# Patient Record
Sex: Male | Born: 1955 | Race: White | Hispanic: No | Marital: Married | Smoking: Never smoker
Health system: Southern US, Community
[De-identification: ages and names within clinical notes are randomized; demographics above are authoritative.]

## PROBLEM LIST (undated history)

## (undated) DIAGNOSIS — R200 Anesthesia of skin: Secondary | ICD-10-CM

## (undated) DIAGNOSIS — E785 Hyperlipidemia, unspecified: Secondary | ICD-10-CM

## (undated) DIAGNOSIS — I10 Essential (primary) hypertension: Secondary | ICD-10-CM

## (undated) DIAGNOSIS — R7303 Prediabetes: Secondary | ICD-10-CM

## (undated) DIAGNOSIS — U071 COVID-19: Secondary | ICD-10-CM

## (undated) DIAGNOSIS — C801 Malignant (primary) neoplasm, unspecified: Secondary | ICD-10-CM

## (undated) HISTORY — DX: Essential (primary) hypertension: I10

## (undated) HISTORY — DX: Hyperlipidemia, unspecified: E78.5

## (undated) HISTORY — DX: Anesthesia of skin: R20.0

## (undated) HISTORY — PX: TONSILLECTOMY: SUR1361

## (undated) HISTORY — DX: Prediabetes: R73.03

---

## 2007-02-10 ENCOUNTER — Encounter: Admission: RE | Admit: 2007-02-10 | Discharge: 2007-02-10 | Payer: Self-pay | Admitting: Family Medicine

## 2010-02-04 ENCOUNTER — Encounter: Admission: RE | Admit: 2010-02-04 | Discharge: 2010-02-04 | Payer: Self-pay | Admitting: Gastroenterology

## 2010-03-11 ENCOUNTER — Ambulatory Visit (HOSPITAL_COMMUNITY): Admission: RE | Admit: 2010-03-11 | Discharge: 2010-03-11 | Payer: Self-pay | Admitting: Gastroenterology

## 2013-08-06 ENCOUNTER — Other Ambulatory Visit: Payer: Self-pay | Admitting: Family Medicine

## 2013-08-06 DIAGNOSIS — I6529 Occlusion and stenosis of unspecified carotid artery: Secondary | ICD-10-CM

## 2013-08-10 ENCOUNTER — Ambulatory Visit
Admission: RE | Admit: 2013-08-10 | Discharge: 2013-08-10 | Disposition: A | Discharge status: Home / Self Care | Payer: BC Managed Care – PPO | Source: Ambulatory Visit | Attending: Family Medicine | Admitting: Family Medicine

## 2013-08-10 DIAGNOSIS — I6529 Occlusion and stenosis of unspecified carotid artery: Secondary | ICD-10-CM

## 2013-08-24 ENCOUNTER — Ambulatory Visit (HOSPITAL_BASED_OUTPATIENT_CLINIC_OR_DEPARTMENT_OTHER): Payer: BC Managed Care – PPO | Attending: Otolaryngology

## 2013-08-24 VITALS — Ht 70.0 in | Wt 210.0 lb

## 2013-08-24 DIAGNOSIS — R0683 Snoring: Secondary | ICD-10-CM

## 2013-08-24 DIAGNOSIS — G4733 Obstructive sleep apnea (adult) (pediatric): Secondary | ICD-10-CM | POA: Insufficient documentation

## 2013-09-08 DIAGNOSIS — R0989 Other specified symptoms and signs involving the circulatory and respiratory systems: Secondary | ICD-10-CM

## 2013-09-08 DIAGNOSIS — R0609 Other forms of dyspnea: Secondary | ICD-10-CM

## 2013-09-08 NOTE — Sleep Study (Signed)
   NAME: Ronald Arnold DATE OF BIRTH:  [birth year] MEDICAL RECORD NUMBER [id number]  LOCATION: [facility]  PHYSICIAN: Marthella Osorno D  DATE OF STUDY: 08/24/2013  SLEEP STUDY TYPE: Nocturnal Polysomnogram               REFERRING PHYSICIAN: Thornell Sartorius, MD  INDICATION FOR STUDY: Hypersomnia with sleep apnea  EPWORTH SLEEPINESS SCORE:   8/24 HEIGHT: 5\' 10"  (177.8 cm)  WEIGHT: 210 lb (95.255 kg)    Body mass index is 30.13 kg/(m^2).  NECK SIZE: 16.5 in.  MEDICATIONS: Charted for review  SLEEP ARCHITECTURE: Total sleep time 279.5 minutes with sleep efficiency 72.8%. Stage I was 35.6%, stage II 62.1%, stage III absent, REM 2.3% of total sleep time. Sleep latency 22 minutes, REM latency 292.5 minutes, awake after sleep onset 83.5 minutes, arousal index 86.5. Bedtime medication: None. Sleep was markedly fragmented by frequent brief awakenings and sleep stage changes corresponding to respiratory events.  RESPIRATORY DATA: Apnea hypopnea index (AHI) 88.2 per hour. 411 total events scored including 306 obstructive apneas, 25 central apneas, 27 mixed apneas, 53 hypopneas. Events were not positional. REM AHI 83.1 per hour. This study was ordered as a diagnostic polysomnogram and CPAP titration was not done.  OXYGEN DATA: Loud snoring with oxygen desaturation to a nadir of 79% on room air with a mean oxygen saturation through the study of 92.2% on room air.  CARDIAC DATA: Normal cardiac rhythm  MOVEMENT/PARASOMNIA: No significant movement disturbance, no bathroom trips  IMPRESSION/ RECOMMENDATION:   1) Severe obstructive sleep apnea/hypopneas syndrome, AHI 88.2 per hour with non-positional events. Loud snoring with oxygen desaturation to a nadir of 79% and mean oxygen saturation of 92.2% on room air. 2) This study was ordered as a diagnostic polysomnogram protocol without CPAP. The patient can be scheduled for a dedicated to CPAP titration study through the [facility] it appropriate.   Signed Baird Lyons M.D.  Deneise Lever Diplomate, American Board of Sleep Medicine  ELECTRONICALLY SIGNED ON:  09/08/2013, 10:02 AM [place] PH: [phone]   FX: [phone] [place]

## 2014-05-23 ENCOUNTER — Other Ambulatory Visit: Payer: Self-pay | Admitting: Urology

## 2014-06-12 NOTE — Patient Instructions (Addendum)
Debra Calabretta Tino  06/12/2014   Your procedure is scheduled on:  06/20/2014    Come thru the Emergency room Entrance.    Follow the Signs to [facility] at  [place]      am  Call this number if you have problems the morning of surgery: [phone]   Remember:   Do not eat food or drink liquids after midnight.   Take these medicines the morning of surgery with A SIP OF WATER: Flonase nasal spray , Allegra    Do not wear jewelry,   Do not wear lotions, powders, or perfumes.  deodorant.  . Men may shave face and neck.  Do not bring valuables to the hospital.  Contacts, dentures or bridgework may not be worn into surgery.  Leave suitcase in the car. After surgery it may be brought to your room.  For patients admitted to the hospital, checkout time is 11:00 AM the day of  discharge.         Please read over the following fact sheets that you were given: , coughing and deep breathing exercises, leg exercises            [facility] - Preparing for Surgery Before surgery, you can play an important role.  Because skin is not sterile, your skin needs to be as free of germs as possible.  You can reduce the number of germs on your skin by washing with CHG (chlorahexidine gluconate) soap before surgery.  CHG is an antiseptic cleaner which kills germs and bonds with the skin to continue killing germs even after washing. Please DO NOT use if you have an allergy to CHG or antibacterial soaps.  If your skin becomes reddened/irritated stop using the CHG and inform your nurse when you arrive at Short Stay. Do not shave (including legs and underarms) for at least 48 hours prior to the first CHG shower.  You may shave your face/neck. Please follow these instructions carefully:  1.  Shower with CHG Soap the night before surgery and the  morning of Surgery.  2.  If you choose to wash your hair, wash your hair first as usual with your  normal  shampoo.  3.  After you shampoo, rinse your hair and body  thoroughly to remove the  shampoo.                           4.  Use CHG as you would any other liquid soap.  You can apply chg directly  to the skin and wash                       Gently with a scrungie or clean washcloth.  5.  Apply the CHG Soap to your body ONLY FROM THE NECK DOWN.   Do not use on face/ open                           Wound or open sores. Avoid contact with eyes, ears mouth and genitals (private parts).                       Wash face,  Genitals (private parts) with your normal soap.             6.  Wash thoroughly, paying special attention to the area where your surgery  will be performed.  7.  Thoroughly rinse your body with warm water from the neck down.  8.  DO NOT shower/wash with your normal soap after using and rinsing off  the CHG Soap.                9.  Pat yourself dry with a clean towel.            10.  Wear clean pajamas.            11.  Place clean sheets on your bed the night of your first shower and do not  sleep with pets. Day of Surgery : Do not apply any lotions/deodorants the morning of surgery.  Please wear clean clothes to the hospital/surgery center.  FAILURE TO FOLLOW THESE INSTRUCTIONS MAY RESULT IN THE CANCELLATION OF YOUR SURGERY PATIENT SIGNATURE_________________________________  NURSE SIGNATURE__________________________________  ________________________________________________________________________  WHAT IS A BLOOD TRANSFUSION? Blood Transfusion Information  A transfusion is the replacement of blood or some of its parts. Blood is made up of multiple cells which provide different functions.  Red blood cells carry oxygen and are used for blood loss replacement.  White blood cells fight against infection.  Platelets control bleeding.  Plasma helps clot blood.  Other blood products are available for specialized needs, such as hemophilia or other clotting disorders. BEFORE THE TRANSFUSION  Who gives blood for transfusions?   Healthy  volunteers who are fully evaluated to make sure their blood is safe. This is blood bank blood. Transfusion therapy is the safest it has ever been in the practice of medicine. Before blood is taken from a donor, a complete history is taken to make sure that person has no history of diseases nor engages in risky social behavior (examples are intravenous drug use or sexual activity with multiple partners). The donor's travel history is screened to minimize risk of transmitting infections, such as malaria. The donated blood is tested for signs of infectious diseases, such as HIV and hepatitis. The blood is then tested to be sure it is compatible with you in order to minimize the chance of a transfusion reaction. If you or a relative donates blood, this is often done in anticipation of surgery and is not appropriate for emergency situations. It takes many days to process the donated blood. RISKS AND COMPLICATIONS Although transfusion therapy is very safe and saves many lives, the main dangers of transfusion include:  1. Getting an infectious disease. 2. Developing a transfusion reaction. This is an allergic reaction to something in the blood you were given. Every precaution is taken to prevent this. The decision to have a blood transfusion has been considered carefully by your caregiver before blood is given. Blood is not given unless the benefits outweigh the risks. AFTER THE TRANSFUSION  Right after receiving a blood transfusion, you will usually feel much better and more energetic. This is especially true if your red blood cells have gotten low (anemic). The transfusion raises the level of the red blood cells which carry oxygen, and this usually causes an energy increase.  The nurse administering the transfusion will monitor you carefully for complications. HOME CARE INSTRUCTIONS  No special instructions are needed after a transfusion. You may find your energy is better. Speak with your caregiver about  any limitations on activity for underlying diseases you may have. SEEK MEDICAL CARE IF:   Your condition is not improving after your transfusion.  You develop redness or irritation at the intravenous (IV) site. SEEK IMMEDIATE MEDICAL CARE IF:  Any of  the following symptoms occur over the next 12 hours:  Shaking chills.  You have a temperature by mouth above 102 F (38.9 C), not controlled by medicine.  Chest, back, or muscle pain.  People around you feel you are not acting correctly or are confused.  Shortness of breath or difficulty breathing.  Dizziness and fainting.  You get a rash or develop hives.  You have a decrease in urine output.  Your urine turns a dark color or changes to pink, red, or brown. Any of the following symptoms occur over the next 10 days:  You have a temperature by mouth above 102 F (38.9 C), not controlled by medicine.  Shortness of breath.  Weakness after normal activity.  The white part of the eye turns yellow (jaundice).  You have a decrease in the amount of urine or are urinating less often.  Your urine turns a dark color or changes to pink, red, or brown. Document Released: 07/02/2000 Document Revised: 09/27/2011 Document Reviewed: 02/19/2008 ExitCare Patient Information 2014 [place].  _______________________________________________________________________  Incentive Spirometer  An incentive spirometer is a tool that can help keep your lungs clear and active. This tool measures how well you are filling your lungs with each breath. Taking long deep breaths may help reverse or decrease the chance of developing breathing (pulmonary) problems (especially infection) following:  A long period of time when you are unable to move or be active. BEFORE THE PROCEDURE   If the spirometer includes an indicator to show your best effort, your nurse or respiratory therapist will set it to a desired goal.  If possible, sit up straight or  lean slightly forward. Try not to slouch.  Hold the incentive spirometer in an upright position. INSTRUCTIONS FOR USE  3. Sit on the edge of your bed if possible, or sit up as far as you can in bed or on a chair. 4. Hold the incentive spirometer in an upright position. 5. Breathe out normally. 6. Place the mouthpiece in your mouth and seal your lips tightly around it. 7. Breathe in slowly and as deeply as possible, raising the piston or the ball toward the top of the column. 8. Hold your breath for 3-5 seconds or for as long as possible. Allow the piston or ball to fall to the bottom of the column. 9. Remove the mouthpiece from your mouth and breathe out normally. 10. Rest for a few seconds and repeat Steps 1 through 7 at least 10 times every 1-2 hours when you are awake. Take your time and take a few normal breaths between deep breaths. 11. The spirometer may include an indicator to show your best effort. Use the indicator as a goal to work toward during each repetition. 12. After each set of 10 deep breaths, practice coughing to be sure your lungs are clear. If you have an incision (the cut made at the time of surgery), support your incision when coughing by placing a pillow or rolled up towels firmly against it. Once you are able to get out of bed, walk around indoors and cough well. You may stop using the incentive spirometer when instructed by your caregiver.  RISKS AND COMPLICATIONS  Take your time so you do not get dizzy or light-headed.  If you are in pain, you may need to take or ask for pain medication before doing incentive spirometry. It is harder to take a deep breath if you are having pain. AFTER USE  Rest and breathe slowly and  easily.  It can be helpful to keep track of a log of your progress. Your caregiver can provide you with a simple table to help with this. If you are using the spirometer at home, follow these instructions: [place] IF:   You are having  difficultly using the spirometer.  You have trouble using the spirometer as often as instructed.  Your pain medication is not giving enough relief while using the spirometer.  You develop fever of 100.5 F (38.1 C) or higher. SEEK IMMEDIATE MEDICAL CARE IF:   You cough up bloody sputum that had not been present before.  You develop fever of 102 F (38.9 C) or greater.  You develop worsening pain at or near the incision site. MAKE SURE YOU:   Understand these instructions.  Will watch your condition.  Will get help right away if you are not doing well or get worse. Document Released: 11/15/2006 Document Revised: 09/27/2011 Document Reviewed: 01/16/2007 [facility] Patient Information 2014 [place], [place].   ________________________________________________________________________

## 2014-06-17 ENCOUNTER — Encounter (HOSPITAL_COMMUNITY): Payer: Self-pay

## 2014-06-17 ENCOUNTER — Ambulatory Visit (HOSPITAL_COMMUNITY)
Admission: RE | Admit: 2014-06-17 | Discharge: 2014-06-17 | Disposition: A | Discharge status: Home / Self Care | Payer: BC Managed Care – PPO | Source: Ambulatory Visit | Attending: Urology | Admitting: Urology

## 2014-06-17 ENCOUNTER — Encounter (HOSPITAL_COMMUNITY)
Admission: RE | Admit: 2014-06-17 | Discharge: 2014-06-17 | Disposition: A | Discharge status: Home / Self Care | Payer: BC Managed Care – PPO | Source: Ambulatory Visit | Attending: Urology | Admitting: Urology

## 2014-06-17 ENCOUNTER — Encounter (HOSPITAL_COMMUNITY): Payer: Self-pay | Admitting: *Deleted

## 2014-06-17 DIAGNOSIS — Z01818 Encounter for other preprocedural examination: Secondary | ICD-10-CM | POA: Insufficient documentation

## 2014-06-17 LAB — CBC
HCT: 47.2 % (ref 39.0–52.0)
Hemoglobin: 16 g/dL (ref 13.0–17.0)
MCH: 31.2 pg (ref 26.0–34.0)
MCHC: 33.9 g/dL (ref 30.0–36.0)
MCV: 92 fL (ref 78.0–100.0)
Platelets: 248 10*3/uL (ref 150–400)
RBC: 5.13 MIL/uL (ref 4.22–5.81)
RDW: 12.8 % (ref 11.5–15.5)
WBC: 5.1 10*3/uL (ref 4.0–10.5)

## 2014-06-17 LAB — ABO/RH: ABO/RH(D): O POS

## 2014-06-17 LAB — BASIC METABOLIC PANEL
Anion gap: 10 (ref 5–15)
BUN: 16 mg/dL (ref 6–23)
CO2: 29 mEq/L (ref 19–32)
Calcium: 10 mg/dL (ref 8.4–10.5)
Chloride: 95 mEq/L — ABNORMAL LOW (ref 96–112)
Creatinine, Ser: 0.87 mg/dL (ref 0.50–1.35)
GFR calc Af Amer: >90 mL/min (ref >90)
GFR calc non Af Amer: >90 mL/min (ref >90)
Glucose, Bld: 110 mg/dL — ABNORMAL HIGH (ref 70–99)
Potassium: 5 mEq/L (ref 3.7–5.3)
Sodium: 134 mEq/L — ABNORMAL LOW (ref 137–147)

## 2014-06-19 NOTE — Anesthesia Preprocedure Evaluation (Addendum)
Anesthesia Evaluation  Patient identified by MRN, date of birth, ID band Patient awake    Reviewed: Allergy & Precautions, H&P , NPO status , Patient's Chart, lab work & pertinent test results  History of Anesthesia Complications Negative for: history of anesthetic complications  Airway Mallampati: II  TM Distance: >3 FB Neck ROM: Full    Dental no notable dental hx. (+) Dental Advisory Given   Pulmonary neg pulmonary ROS,  breath sounds clear to auscultation  Pulmonary exam normal       Cardiovascular Exercise Tolerance: Good negative cardio ROS  Rhythm:Regular Rate:Normal     Neuro/Psych negative neurological ROS  negative psych ROS   GI/Hepatic negative GI ROS, Neg liver ROS,   Endo/Other  negative endocrine ROS  Renal/GU negative Renal ROS  negative genitourinary   Musculoskeletal negative musculoskeletal ROS (+)   Abdominal   Peds negative pediatric ROS (+)  Hematology negative hematology ROS (+)   Anesthesia Other Findings Prostate ca  Reproductive/Obstetrics negative OB ROS                             Anesthesia Physical Anesthesia Plan  ASA: II  Anesthesia Plan: General   Post-op Pain Management:    Induction: Intravenous  Airway Management Planned: Oral ETT  Additional Equipment:   Intra-op Plan:   Post-operative Plan: Extubation in OR  Informed Consent: I have reviewed the patients History and Physical, chart, labs and discussed the procedure including the risks, benefits and alternatives for the proposed anesthesia with the patient or authorized representative who has indicated his/her understanding and acceptance.   Dental advisory given  Plan Discussed with: CRNA  Anesthesia Plan Comments:         Anesthesia Quick Evaluation

## 2014-06-19 NOTE — H&P (Signed)
Chief Complaint Prostate Cancer   Reason For Visit Reason for consult: To discuss treatment options for prostate cancer and specifically to consider surgical therapy with a robotic prostatectomy.  Physician requesting consult: Dr. Eda Keys  PCP: Dr. 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 He was noted to have an elevated PSA of 4.37 prompting a prostate needle biopsy on 04/15/14. 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 He has a paternal family history of prostate cancer. His father was apparently diagnosed in his late 63s but did not appear to died of complications directly related to prostate cancer. He has been thoroughly counseled by Dr. Junious Silk and is well informed about his treatment options. He has longevity in his family with his father having died at age 8 and his mom still living at age 34.    TNM stage: cT1c Nx Mx  PSA: 4.37  Gleason score: 4+3=7  Biopsy (04/15/14): 2/12 cores -- R mid (50%, 4+3=7), R base (10%, 4+3=7)  Prostate volume: 41 cc    Nomogram  OC disease: 47%  EPE: 51%  SVI: 5%  LNI: 5%  PFS (surgery): 77% at 5 years, 65% at 10 years    Urinary   function: IPSS is 4.  1  Erectile function:  SHIM score is 25.1     1 Amended By: Raynelle Bring; May 22 2014 9:16 AM EST  Past Medical History Problems  1. History of Fatty liver (K76.0) 2. History of esophageal reflux (Z87.19) 3. History of hypercholesterolemia (Z86.39) 4. History of Serum Enzyme Levels - ALT (SGPT) Elevated 5. History of Transient Global Amnesia  Surgical History Problems  1. History of Tonsillectomy  Current Meds 1. Allegra CAPS;  Therapy: (Recorded:25Mar2015) to Recorded 2. Aspirin 81 MG Oral Tablet;  Therapy: (Recorded:25Feb2014) to Recorded 3. Cholestrol Pill;  Therapy: (Recorded:25Feb2014) to Recorded 4. Fish Oil CAPS;  Therapy: (Recorded:25Mar2015) to Recorded 5.  Flonase SUSP;  Therapy: (Recorded:25Feb2014) to Recorded 6. Levofloxacin 500 MG Oral Tablet; Take 1 tablet po the day before procedure, 1 tab  day of  the procedure and 1 tab the day after procedure;  Therapy: [id number] to (Last Rx:16Sep2015)  Requested for: 16Sep2015 Ordered 7. Multi-Vitamin TABS;  Therapy: (Recorded:25Feb2014) to Recorded 8. Niacin TABS;  Therapy: (Recorded:25Mar2015) to Recorded 9. Red Yeast Rice CAPS;  Therapy: (Recorded:25Feb2014) to Recorded  Allergies Medication  1. Sulfa Drugs  Family History Problems  1. Family history of Death In The Family Father   age 37 of   ? prostate cancer 2. Family history of Diabetes Mellitus : Mother 3. Family history of Hypertension : Mother 4. Family history of Prostate Cancer : Father  Social History Problems    Alcohol Use   1 a week   Marital History - Currently Married   Never A Smoker   Occupation:   Mudlogger at [employer]   Denied: History of Tobacco Use  Review of Systems Genitourinary, constitutional, skin, eye, otolaryngeal, hematologic/lymphatic, cardiovascular, pulmonary, endocrine, musculoskeletal, gastrointestinal, neurological and psychiatric system(s) were reviewed and pertinent findings if present are noted.    Vitals Vital Signs [Data Includes: Last 1 Day]  Recorded: [id number] 11:23AM  Blood Pressure: 145 / 93 Temperature: 98 F Heart Rate: 86  Physical Exam Constitutional: Well nourished and well developed . No acute distress.  ENT:. The ears and nose are normal in appearance.  Neck: The appearance of the  neck is normal and no neck mass is present.  Pulmonary: No respiratory distress, normal respiratory rhythm and effort and clear bilateral breath sounds.  Cardiovascular: Heart rate and rhythm are normal . No peripheral edema.  Abdomen: The abdomen is soft and nontender. No masses are palpated. No CVA tenderness. No hernias are palpable. No  hepatosplenomegaly noted.  Rectal: Rectal exam demonstrates normal sphincter tone, no tenderness and no masses. Prostate size is estimated to be 45 g. The prostate has no nodularity and is not tender. The left seminal vesicle is nonpalpable. The right seminal vesicle is nonpalpable. The perineum is normal on inspection.  Lymphatics: The femoral and inguinal nodes are not enlarged or tender.  Skin: Normal skin turgor, no visible rash and no visible skin lesions.  Neuro/Psych:. Mood and affect are appropriate.    Results/Data Selected Results  UA With REFLEX [id number] 11:10AM Raynelle Bring  SPECIMEN TYPE: CLEAN CATCH   Test Name Result Flag Reference  COLOR STRAW  YELLOW  APPEARANCE CLEAR  CLEAR  SPECIFIC GRAVITY <1.005 L 1.005-1.030  pH 7.5  5.0-8.0  GLUCOSE NEG mg/dL  NEG  BILIRUBIN NEG  NEG  KETONE NEG mg/dL  NEG  BLOOD NEG  NEG  PROTEIN NEG mg/dL  NEG  UROBILINOGEN 0.2 mg/dL  0.0-1.0  NITRITE NEG  NEG  LEUKOCYTE ESTERASE NEG  NEG    I have reviewed his medical records, PSA results, and pathology report. Findings are as dictated above.  Assessment Assessed  1. Prostate cancer (C61)  Plan Prostate cancer  1. Follow-up PRN Office  Follow-up  Status: Hold For - Appointment  Requested for:  [id number]  Discussion/Summary 1. Prostate cancer: I had a long and detailed discussion with Dr. Dahlia Byes and his wife today. He is very well informed about his treatment options and is leaning toward proceeding with surgical treatment.   The patient was counseled about the natural history of prostate cancer and the standard treatment options that are available for prostate cancer. It was explained to him how his age and life expectancy, clinical stage, Gleason score, and PSA affect his prognosis, the decision to proceed with additional staging studies, as well as how that information influences recommended treatment strategies. We discussed the roles for active surveillance, radiation therapy,  surgical therapy, androgen deprivation, as well as ablative therapy options for the treatment of prostate cancer as appropriate to his individual cancer situation. We discussed the risks and benefits of these options with regard to their impact on cancer control and also in terms of potential adverse events, complications, and impact on quiality of life particularly related to urinary, bowel, and sexual function. The patient was encouraged to ask questions throughout the discussion today and all questions were answered to his stated satisfaction. In addition, the patient was provided with and/or directed to appropriate resources and literature for further education about prostate cancer and treatment options.   We discussed surgical therapy for prostate cancer including the different available surgical approaches. We discussed, in detail, the risks and expectations of surgery with regard to cancer control, urinary control, and erectile function as well as the expected postoperative recovery process. Additional risks of surgery including but not limited to bleeding, infection, hernia formation, nerve damage, lymphocele formation, bowel/rectal injury potentially necessitating colostomy, damage to the urinary tract resulting in urine leakage, urethral stricture, and the cardiopulmonary risks such as myocardial infarction, stroke, death, venothromboembolism, etc. were explained. The risk of open surgical conversion for robotic/laparoscopic prostatectomy was also discussed.  He has asked numerous good questions today. He would like to discuss things further with his wife but likely wishes to proceed with surgical treatment. My plan would be to perform a bilateral nerve sparing robotic-assisted laparoscopic radical prostatectomy and pelvic lymphadenectomy. 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  A total of 80 minutes were spent in the overall care of the patient today with 75 minutes in direct face to face consultation.     Cc: Dr. Eda Keys  Dr. Melinda Crutch    Signatures Electronically signed by : Raynelle Bring, M.D.; May 22 2014  9:16AM EST

## 2014-06-20 ENCOUNTER — Inpatient Hospital Stay (HOSPITAL_COMMUNITY): Payer: BC Managed Care – PPO | Admitting: Anesthesiology

## 2014-06-20 ENCOUNTER — Inpatient Hospital Stay (HOSPITAL_COMMUNITY)
Admission: RE | Admit: 2014-06-20 | Discharge: 2014-06-21 | DRG: 708 | Disposition: A | Discharge status: Home / Self Care | Payer: BC Managed Care – PPO | Source: Ambulatory Visit | Attending: Urology | Admitting: Urology

## 2014-06-20 ENCOUNTER — Encounter (HOSPITAL_COMMUNITY)
Admission: RE | Disposition: A | Discharge status: Home / Self Care | Payer: Self-pay | Source: Ambulatory Visit | Attending: Urology

## 2014-06-20 ENCOUNTER — Encounter (HOSPITAL_COMMUNITY): Payer: Self-pay | Admitting: *Deleted

## 2014-06-20 DIAGNOSIS — Z833 Family history of diabetes mellitus: Secondary | ICD-10-CM | POA: Diagnosis not present

## 2014-06-20 DIAGNOSIS — Z8042 Family history of malignant neoplasm of prostate: Secondary | ICD-10-CM

## 2014-06-20 DIAGNOSIS — C61 Malignant neoplasm of prostate: Secondary | ICD-10-CM | POA: Diagnosis present

## 2014-06-20 DIAGNOSIS — E780 Pure hypercholesterolemia: Secondary | ICD-10-CM | POA: Diagnosis present

## 2014-06-20 DIAGNOSIS — Z79899 Other long term (current) drug therapy: Secondary | ICD-10-CM | POA: Diagnosis not present

## 2014-06-20 DIAGNOSIS — Z7982 Long term (current) use of aspirin: Secondary | ICD-10-CM

## 2014-06-20 DIAGNOSIS — K219 Gastro-esophageal reflux disease without esophagitis: Secondary | ICD-10-CM | POA: Diagnosis present

## 2014-06-20 HISTORY — PX: LYMPHADENECTOMY: SHX5960

## 2014-06-20 HISTORY — PX: ROBOT ASSISTED LAPAROSCOPIC RADICAL PROSTATECTOMY: SHX5141

## 2014-06-20 HISTORY — DX: Malignant (primary) neoplasm, unspecified: C80.1

## 2014-06-20 LAB — HEMOGLOBIN AND HEMATOCRIT, BLOOD
HCT: 45.9 % (ref 39.0–52.0)
Hemoglobin: 15.7 g/dL (ref 13.0–17.0)

## 2014-06-20 LAB — TYPE AND SCREEN
ABO/RH(D): O POS
Antibody Screen: NEGATIVE

## 2014-06-20 SURGERY — ROBOTIC ASSISTED LAPAROSCOPIC RADICAL PROSTATECTOMY LEVEL 2
Anesthesia: General

## 2014-06-20 MED ORDER — LACTATED RINGERS IV SOLN
INTRAVENOUS | Status: DC | PRN
  Administered 2014-06-20 (×3): via INTRAVENOUS

## 2014-06-20 MED ORDER — MIDAZOLAM HCL 2 MG/2ML IJ SOLN
INTRAMUSCULAR | Status: AC
  Filled 2014-06-20: qty 2

## 2014-06-20 MED ORDER — LACTATED RINGERS IV SOLN: INTRAVENOUS | Status: DC

## 2014-06-20 MED ORDER — FENTANYL CITRATE 0.05 MG/ML IJ SOLN
INTRAMUSCULAR | Status: DC | PRN
  Administered 2014-06-20 (×2): 100 ug via INTRAVENOUS
  Administered 2014-06-20: 50 ug via INTRAVENOUS

## 2014-06-20 MED ORDER — BUPIVACAINE-EPINEPHRINE (PF) 0.25% -1:200000 IJ SOLN
INTRAMUSCULAR | Status: AC
  Filled 2014-06-20: qty 30

## 2014-06-20 MED ORDER — ROCURONIUM BROMIDE 100 MG/10ML IV SOLN
INTRAVENOUS | Status: DC | PRN
  Administered 2014-06-20: 10 mg via INTRAVENOUS
  Administered 2014-06-20: 20 mg via INTRAVENOUS
  Administered 2014-06-20: 10 mg via INTRAVENOUS
  Administered 2014-06-20: 35 mg via INTRAVENOUS
  Administered 2014-06-20: 5 mg via INTRAVENOUS

## 2014-06-20 MED ORDER — NEOSTIGMINE METHYLSULFATE 10 MG/10ML IV SOLN
INTRAVENOUS | Status: AC
  Filled 2014-06-20: qty 1

## 2014-06-20 MED ORDER — BUPIVACAINE-EPINEPHRINE 0.25% -1:200000 IJ SOLN
INTRAMUSCULAR | Status: DC | PRN
  Administered 2014-06-20: 30 mL

## 2014-06-20 MED ORDER — STERILE WATER FOR IRRIGATION IR SOLN
Status: DC | PRN
  Administered 2014-06-20: 1500 mL

## 2014-06-20 MED ORDER — HYDROMORPHONE HCL 2 MG/ML IJ SOLN
INTRAMUSCULAR | Status: AC
  Filled 2014-06-20: qty 1

## 2014-06-20 MED ORDER — HYDROMORPHONE HCL 1 MG/ML IJ SOLN
INTRAMUSCULAR | Status: DC | PRN
  Administered 2014-06-20: 0.5 mg via INTRAVENOUS
  Administered 2014-06-20: 1 mg via INTRAVENOUS
  Administered 2014-06-20: 0.5 mg via INTRAVENOUS

## 2014-06-20 MED ORDER — FENTANYL CITRATE 0.05 MG/ML IJ SOLN
INTRAMUSCULAR | Status: AC
  Filled 2014-06-20: qty 5

## 2014-06-20 MED ORDER — LACTATED RINGERS IV SOLN
INTRAVENOUS | Status: DC | PRN
  Administered 2014-06-20: 200 mL

## 2014-06-20 MED ORDER — KETOROLAC TROMETHAMINE 15 MG/ML IJ SOLN
15.0000 mg | Freq: Four times a day (QID) | INTRAMUSCULAR | Status: DC
  Administered 2014-06-20 – 2014-06-21 (×4): 15 mg via INTRAVENOUS
  Filled 2014-06-20 (×6): qty 1

## 2014-06-20 MED ORDER — ROCURONIUM BROMIDE 100 MG/10ML IV SOLN
INTRAVENOUS | Status: AC
  Filled 2014-06-20: qty 1

## 2014-06-20 MED ORDER — DEXAMETHASONE SODIUM PHOSPHATE 10 MG/ML IJ SOLN
INTRAMUSCULAR | Status: DC | PRN
  Administered 2014-06-20: 10 mg via INTRAVENOUS

## 2014-06-20 MED ORDER — PROPOFOL 10 MG/ML IV BOLUS
INTRAVENOUS | Status: DC | PRN
  Administered 2014-06-20: 180 mg via INTRAVENOUS

## 2014-06-20 MED ORDER — ONDANSETRON HCL 4 MG/2ML IJ SOLN
INTRAMUSCULAR | Status: DC | PRN
  Administered 2014-06-20: 4 mg via INTRAVENOUS

## 2014-06-20 MED ORDER — GLYCOPYRROLATE 0.2 MG/ML IJ SOLN
INTRAMUSCULAR | Status: DC | PRN
  Administered 2014-06-20: 0.6 mg via INTRAVENOUS

## 2014-06-20 MED ORDER — ACETAMINOPHEN 325 MG PO TABS
650.0000 mg | ORAL_TABLET | ORAL | Status: DC | PRN
  Administered 2014-06-20: 650 mg via ORAL
  Filled 2014-06-20: qty 2

## 2014-06-20 MED ORDER — NEOSTIGMINE METHYLSULFATE 10 MG/10ML IV SOLN
INTRAVENOUS | Status: DC | PRN
  Administered 2014-06-20: 4 mg via INTRAVENOUS

## 2014-06-20 MED ORDER — CEFAZOLIN SODIUM 1-5 GM-% IV SOLN
1.0000 g | Freq: Three times a day (TID) | INTRAVENOUS | Status: AC
  Administered 2014-06-20 (×2): 1 g via INTRAVENOUS
  Filled 2014-06-20 (×3): qty 50

## 2014-06-20 MED ORDER — ONDANSETRON HCL 4 MG/2ML IJ SOLN: 4.0000 mg | Freq: Once | INTRAMUSCULAR | Status: DC | PRN

## 2014-06-20 MED ORDER — DIPHENHYDRAMINE HCL 50 MG/ML IJ SOLN: 12.5000 mg | Freq: Four times a day (QID) | INTRAMUSCULAR | Status: DC | PRN

## 2014-06-20 MED ORDER — SODIUM CHLORIDE 0.9 % IR SOLN
Status: DC | PRN
  Administered 2014-06-20: 300 mL via INTRAVESICAL

## 2014-06-20 MED ORDER — DIPHENHYDRAMINE HCL 12.5 MG/5ML PO ELIX: 12.5000 mg | ORAL_SOLUTION | Freq: Four times a day (QID) | ORAL | Status: DC | PRN

## 2014-06-20 MED ORDER — CEFAZOLIN SODIUM-DEXTROSE 2-3 GM-% IV SOLR
2.0000 g | INTRAVENOUS | Status: AC
  Administered 2014-06-20: 2 g via INTRAVENOUS

## 2014-06-20 MED ORDER — DOCUSATE SODIUM 100 MG PO CAPS
100.0000 mg | ORAL_CAPSULE | Freq: Two times a day (BID) | ORAL | Status: DC
  Administered 2014-06-20 – 2014-06-21 (×3): 100 mg via ORAL
  Filled 2014-06-20 (×4): qty 1

## 2014-06-20 MED ORDER — MORPHINE SULFATE 2 MG/ML IJ SOLN: 2.0000 mg | INTRAMUSCULAR | Status: DC | PRN

## 2014-06-20 MED ORDER — PROPOFOL 10 MG/ML IV BOLUS
INTRAVENOUS | Status: AC
  Filled 2014-06-20: qty 20

## 2014-06-20 MED ORDER — CIPROFLOXACIN HCL 500 MG PO TABS: 500.0000 mg | ORAL_TABLET | Freq: Two times a day (BID) | ORAL | Status: DC

## 2014-06-20 MED ORDER — SODIUM CHLORIDE 0.9 % IV BOLUS (SEPSIS)
1000.0000 mL | Freq: Once | INTRAVENOUS | Status: AC
  Administered 2014-06-20: 1000 mL via INTRAVENOUS

## 2014-06-20 MED ORDER — HEPARIN SODIUM (PORCINE) 1000 UNIT/ML IJ SOLN
INTRAMUSCULAR | Status: AC
  Filled 2014-06-20: qty 1

## 2014-06-20 MED ORDER — LIDOCAINE HCL (CARDIAC) 20 MG/ML IV SOLN
INTRAVENOUS | Status: AC
  Filled 2014-06-20: qty 5

## 2014-06-20 MED ORDER — CEFAZOLIN SODIUM-DEXTROSE 2-3 GM-% IV SOLR
INTRAVENOUS | Status: AC
  Filled 2014-06-20: qty 50

## 2014-06-20 MED ORDER — GLYCOPYRROLATE 0.2 MG/ML IJ SOLN
INTRAMUSCULAR | Status: AC
  Filled 2014-06-20: qty 3

## 2014-06-20 MED ORDER — KCL IN DEXTROSE-NACL 20-5-0.45 MEQ/L-%-% IV SOLN
INTRAVENOUS | Status: DC
  Administered 2014-06-20 (×2): via INTRAVENOUS
  Filled 2014-06-20 (×4): qty 1000

## 2014-06-20 MED ORDER — FLUTICASONE PROPIONATE 50 MCG/ACT NA SUSP
1.0000 | Freq: Every day | NASAL | Status: DC
  Administered 2014-06-21: 1 via NASAL
  Filled 2014-06-20: qty 16

## 2014-06-20 MED ORDER — LORATADINE 10 MG PO TABS
10.0000 mg | ORAL_TABLET | Freq: Every day | ORAL | Status: DC
  Administered 2014-06-21: 10 mg via ORAL
  Filled 2014-06-20: qty 1

## 2014-06-20 MED ORDER — SUCCINYLCHOLINE CHLORIDE 20 MG/ML IJ SOLN
INTRAMUSCULAR | Status: DC | PRN
  Administered 2014-06-20: 100 mg via INTRAVENOUS

## 2014-06-20 MED ORDER — DEXAMETHASONE SODIUM PHOSPHATE 10 MG/ML IJ SOLN
INTRAMUSCULAR | Status: AC
  Filled 2014-06-20: qty 1

## 2014-06-20 MED ORDER — HYDROCODONE-ACETAMINOPHEN 5-325 MG PO TABS: 1.0000 | ORAL_TABLET | Freq: Four times a day (QID) | ORAL | Status: DC | PRN

## 2014-06-20 MED ORDER — LIDOCAINE HCL (CARDIAC) 20 MG/ML IV SOLN
INTRAVENOUS | Status: DC | PRN
  Administered 2014-06-20: 50 mg via INTRAVENOUS

## 2014-06-20 MED ORDER — HYDROMORPHONE HCL 1 MG/ML IJ SOLN: 0.2500 mg | INTRAMUSCULAR | Status: DC | PRN

## 2014-06-20 MED ORDER — ONDANSETRON HCL 4 MG/2ML IJ SOLN
INTRAMUSCULAR | Status: AC
  Filled 2014-06-20: qty 2

## 2014-06-20 SURGICAL SUPPLY — 48 items
CABLE HIGH FREQUENCY MONO STRZ (ELECTRODE) ×3 IMPLANT
CATH FOLEY 2WAY SLVR 18FR 30CC (CATHETERS) ×3 IMPLANT
CATH ROBINSON RED A/P 16FR (CATHETERS) ×3 IMPLANT
CATH ROBINSON RED A/P 8FR (CATHETERS) ×3 IMPLANT
CATH TIEMANN FOLEY 18FR 5CC (CATHETERS) ×3 IMPLANT
CHLORAPREP W/TINT 26ML (MISCELLANEOUS) ×3 IMPLANT
CLIP LIGATING HEM O LOK PURPLE (MISCELLANEOUS) ×6 IMPLANT
CLOTH BEACON ORANGE TIMEOUT ST (SAFETY) ×3 IMPLANT
COVER SURGICAL LIGHT HANDLE (MISCELLANEOUS) ×3 IMPLANT
COVER TIP SHEARS 8 DVNC (MISCELLANEOUS) ×2 IMPLANT
COVER TIP SHEARS 8MM DA VINCI (MISCELLANEOUS) ×1
CUTTER ECHEON FLEX ENDO 45 340 (ENDOMECHANICALS) ×3 IMPLANT
DECANTER SPIKE VIAL GLASS SM (MISCELLANEOUS) IMPLANT
DRAPE SURG IRRIG POUCH 19X23 (DRAPES) ×3 IMPLANT
DRSG TEGADERM 4X4.75 (GAUZE/BANDAGES/DRESSINGS) ×3 IMPLANT
DRSG TEGADERM 6X8 (GAUZE/BANDAGES/DRESSINGS) ×6 IMPLANT
ELECT REM PT RETURN 9FT ADLT (ELECTROSURGICAL) ×3
ELECTRODE REM PT RTRN 9FT ADLT (ELECTROSURGICAL) ×2 IMPLANT
GLOVE BIO SURGEON STRL SZ 6.5 (GLOVE) ×9 IMPLANT
GLOVE BIOGEL M STRL SZ7.5 (GLOVE) ×9 IMPLANT
GOWN STRL REUS W/TWL LRG LVL3 (GOWN DISPOSABLE) ×9 IMPLANT
HOLDER FOLEY CATH W/STRAP (MISCELLANEOUS) ×3 IMPLANT
IV LACTATED RINGERS 1000ML (IV SOLUTION) ×3 IMPLANT
KIT ACCESSORY DA VINCI DISP (KITS) ×1
KIT ACCESSORY DVNC DISP (KITS) ×2 IMPLANT
LIQUID BAND (GAUZE/BANDAGES/DRESSINGS) ×3 IMPLANT
MANIFOLD NEPTUNE II (INSTRUMENTS) ×3 IMPLANT
NDL SAFETY ECLIPSE 18X1.5 (NEEDLE) ×2 IMPLANT
NEEDLE HYPO 18GX1.5 SHARP (NEEDLE) ×1
PACK ROBOT UROLOGY CUSTOM (CUSTOM PROCEDURE TRAY) ×3 IMPLANT
RELOAD GREEN ECHELON 45 (STAPLE) ×3 IMPLANT
SET TUBE IRRIG SUCTION NO TIP (IRRIGATION / IRRIGATOR) ×3 IMPLANT
SOLUTION ELECTROLUBE (MISCELLANEOUS) ×3 IMPLANT
SUT ETHILON 3 0 PS 1 (SUTURE) ×3 IMPLANT
SUT MNCRL 3 0 RB1 (SUTURE) ×2 IMPLANT
SUT MNCRL 3 0 VIOLET RB1 (SUTURE) ×2 IMPLANT
SUT MNCRL AB 4-0 PS2 18 (SUTURE) ×6 IMPLANT
SUT MONOCRYL 3 0 RB1 (SUTURE) ×2
SUT VIC AB 0 CT1 27 (SUTURE) ×1
SUT VIC AB 0 CT1 27XBRD ANTBC (SUTURE) ×2 IMPLANT
SUT VIC AB 0 UR5 27 (SUTURE) ×3 IMPLANT
SUT VIC AB 2-0 SH 27 (SUTURE) ×1
SUT VIC AB 2-0 SH 27X BRD (SUTURE) ×2 IMPLANT
SUT VICRYL 0 UR6 27IN ABS (SUTURE) ×6 IMPLANT
SYR 27GX1/2 1ML LL SAFETY (SYRINGE) ×3 IMPLANT
TOWEL OR 17X26 10 PK STRL BLUE (TOWEL DISPOSABLE) ×3 IMPLANT
TOWEL OR NON WOVEN STRL DISP B (DISPOSABLE) ×3 IMPLANT
WATER STERILE IRR 1500ML POUR (IV SOLUTION) ×6 IMPLANT

## 2014-06-20 NOTE — Progress Notes (Signed)
Patient ID: Ronald Arnold, male   DOB: Jun 21, 1956, 58 y.o.   MRN: [id number] Post-op note  Subjective: The patient is doing well.  No complaints.  Objective: Vital signs in last 24 hours: Temp:  [97.4 F (36.3 C)-98.4 F (36.9 C)] 98.4 F (36.9 C) (12/03 1138) Pulse Rate:  [65-80] 65 (12/03 1138) Resp:  [10-22] 15 (12/03 1127) BP: (137-161)/(81-102) 147/81 mmHg (12/03 1138) SpO2:  [97 %-100 %] 97 % (12/03 1138) Weight:  [97.07 kg (214 lb)] 97.07 kg (214 lb) (12/03 0620)  Intake/Output from previous day:   Intake/Output this shift: Total I/O In: 3450 [I.V.:2450; IV Piggyback:1000] Out: 280 [Urine:150; Drains:30; Blood:100]  Physical Exam:  General: Alert and oriented. Abdomen: Soft, Nondistended. Incisions: Clean and dry. Urine: red  Lab Results:  Recent Labs  06/20/14 1049  HGB 15.7  HCT 45.9    Assessment/Plan: POD#0   1) Continue to monitor  2) DVT prophy, clears, IS, amb, pain control   LOS: 0 days   Ronald Arnold 06/20/2014, 2:53 PM

## 2014-06-20 NOTE — Anesthesia Postprocedure Evaluation (Signed)
  Anesthesia Post-op Note  Patient: Ronald Arnold  Procedure(s) Performed: Procedure(s) (LRB): ROBOTIC ASSISTED LAPAROSCOPIC RADICAL PROSTATECTOMY LEVEL 2 (N/A) LYMPHADENECTOMY (Bilateral)  Patient Location: PACU  Anesthesia Type: General  Level of Consciousness: awake and alert   Airway and Oxygen Therapy: Patient Spontanous Breathing  Post-op Pain: mild  Post-op Assessment: Post-op Vital signs reviewed, Patient's Cardiovascular Status Stable, Respiratory Function Stable, Patent Airway and No signs of Nausea or vomiting  Last Vitals:  Filed Vitals:   06/20/14 1138  BP: 147/81  Pulse: 65  Temp: 36.9 C  Resp:     Post-op Vital Signs: stable   Complications: No apparent anesthesia complications

## 2014-06-20 NOTE — Discharge Instructions (Signed)

## 2014-06-20 NOTE — Plan of Care (Signed)
Problem: Consults Goal: Radical Robotic Prostatectomy Patient Education Outcome: Progressing Goal: Skin Care Protocol Initiated - if Braden Score 18 or less If consults are not indicated, leave blank or document N/A Outcome: Completed/Met Date Met:  06/20/14 Goal: Nutrition Consult-if indicated Outcome: Not Applicable Date Met:  37/35/78 Goal: Diabetes Guidelines if Diabetic/Glucose > 140 If diabetic or lab glucose is > 140 mg/dl - Initiate Diabetes/Hyperglycemia Guidelines & Document Interventions  Outcome: Not Applicable Date Met:  97/84/78

## 2014-06-20 NOTE — Interval H&P Note (Signed)
History and Physical Interval Note:  06/20/2014 7:07 AM  Ronald Arnold  has presented today for surgery, with the diagnosis of PROSTATE CANCER  The various methods of treatment have been discussed with the patient and family. After consideration of risks, benefits and other options for treatment, the patient has consented to  Procedure(s): ROBOTIC ASSISTED LAPAROSCOPIC RADICAL PROSTATECTOMY LEVEL 2 (N/A) LYMPHADENECTOMY (Bilateral) as a surgical intervention .  The patient's history has been reviewed, patient examined, no change in status, stable for surgery.  I have reviewed the patient's chart and labs.  Questions were answered to the patient's satisfaction.     Jamyla Ard,LES

## 2014-06-20 NOTE — Op Note (Signed)

## 2014-06-20 NOTE — Transfer of Care (Signed)
Immediate Anesthesia Transfer of Care Note  Patient: Ronald Arnold  Procedure(s) Performed: Procedure(s): ROBOTIC ASSISTED LAPAROSCOPIC RADICAL PROSTATECTOMY LEVEL 2 (N/A) LYMPHADENECTOMY (Bilateral)  Patient Location: PACU  Anesthesia Type:General  Level of Consciousness: awake, alert  and oriented  Airway & Oxygen Therapy: Patient Spontanous Breathing and Patient connected to face mask oxygen  Post-op Assessment: Report given to PACU RN and Post -op Vital signs reviewed and stable  Post vital signs: Reviewed and stable  Complications: No apparent anesthesia complications

## 2014-06-21 ENCOUNTER — Encounter (HOSPITAL_COMMUNITY): Payer: Self-pay | Admitting: Urology

## 2014-06-21 LAB — HEMOGLOBIN AND HEMATOCRIT, BLOOD
HCT: 37.1 % — ABNORMAL LOW (ref 39.0–52.0)
HCT: 36.9 % — ABNORMAL LOW (ref 39.0–52.0)
Hemoglobin: 12.9 g/dL — ABNORMAL LOW (ref 13.0–17.0)
Hemoglobin: 12.5 g/dL — ABNORMAL LOW (ref 13.0–17.0)

## 2014-06-21 MED ORDER — BISACODYL 10 MG RE SUPP
10.0000 mg | Freq: Once | RECTAL | Status: AC
  Administered 2014-06-21: 10 mg via RECTAL
  Filled 2014-06-21: qty 1

## 2014-06-21 MED ORDER — HYDROCODONE-ACETAMINOPHEN 5-325 MG PO TABS: 1.0000 | ORAL_TABLET | Freq: Four times a day (QID) | ORAL | Status: DC | PRN

## 2014-06-21 NOTE — Plan of Care (Signed)
Problem: Phase I Progression Outcomes Goal: Walk in halls when awake from anesthesia Outcome: Completed/Met Date Met:  06/21/14

## 2014-06-21 NOTE — Plan of Care (Signed)
Problem: Phase II Progression Outcomes Goal: Vital signs stable Outcome: Completed/Met Date Met:  06/21/14

## 2014-06-21 NOTE — Plan of Care (Signed)
Problem: Phase II Progression Outcomes Goal: Discharge plan established Outcome: Completed/Met Date Met:  06/21/14

## 2014-06-21 NOTE — Progress Notes (Signed)
Patient ID: Ronald Arnold, male   DOB: 1955/11/21, 58 y.o.   MRN: [id number]  1 Day Post-Op Subjective: The patient is doing well.  No nausea or vomiting. Pain is adequately controlled.  Objective: Vital signs in last 24 hours: Temp:  [98 F (36.7 C)-98.7 F (37.1 C)] 98.4 F (36.9 C) (12/04 0528) Pulse Rate:  [64-80] 68 (12/04 0528) Resp:  [10-22] 20 (12/04 0528) BP: (109-161)/(56-102) 113/58 mmHg (12/04 0528) SpO2:  [95 %-100 %] 95 % (12/04 0528)  Intake/Output from previous day: 12/03 0701 - 12/04 0700 In: 7682.5 [P.O.:1180; I.V.:5502.5; IV Piggyback:1000] Out: 3289 [Urine:3075; Drains:114; Blood:100] Intake/Output this shift:    Physical Exam:  General: Alert and oriented. CV: RRR Lungs: Clear bilaterally. GI: Soft, Nondistended. Incisions: Clean, dry, and intact Urine: Clear Extremities: Nontender, no erythema, no edema.  Lab Results:  Recent Labs  06/20/14 1049 06/21/14 0450  HGB 15.7 12.9*  HCT 45.9 37.1*      Assessment/Plan: POD# 1 s/p robotic prostatectomy.  1) SL IVF 2) Ambulate, Incentive spirometry 3) Transition to oral pain medication 4) Dulcolax suppository 5) D/C pelvic drain 6) Plan for likely discharge later today (recheck Hgb prior to d/c)   Ronald Arnold. MD   LOS: 1 day   Ronald Arnold,LES 06/21/2014, 7:49 AM

## 2014-06-21 NOTE — Discharge Summary (Signed)
  Date of admission: 06/20/2014  Date of discharge: 06/21/2014  Admission diagnosis: Prostate Cancer  Discharge diagnosis: Prostate Cancer  History and Physical: For full details, please see admission history and physical. Briefly, Ronald Arnold is a 58 y.o. gentleman with localized prostate cancer.  After discussing management/treatment options, he elected to proceed with surgical treatment.  Hospital Course: Ronald Arnold Reason was taken to the operating room on 06/20/2014 and underwent a robotic assisted laparoscopic radical prostatectomy. He tolerated this procedure well and without complications. Postoperatively, he was able to be transferred to a regular hospital room following recovery from anesthesia.  He was able to begin ambulating the night of surgery. He remained hemodynamically stable overnight.  He had excellent urine output with appropriately minimal output from his pelvic drain and his pelvic drain was removed on POD #1.  He was transitioned to oral pain medication, tolerated a clear liquid diet, and had met all discharge criteria and was able to be discharged home later on POD#1.  Laboratory values:  Recent Labs  06/20/14 1049 06/21/14 0450 06/21/14 1107  HGB 15.7 12.9* 12.5*  HCT 45.9 37.1* 36.9*    Disposition: Home  Discharge instruction: He was instructed to be ambulatory but to refrain from heavy lifting, strenuous activity, or driving. He was instructed on urethral catheter care.  Discharge medications:     Medication List    STOP taking these medications        aspirin EC 81 MG tablet     diphenhydrAMINE 25 MG tablet  Commonly known as:  SOMINEX     diphenhydrAMINE 25 MG tablet  Commonly known as:  BENADRYL     multivitamin with minerals Tabs tablet     Omega 3 1000 MG Caps     OVER THE COUNTER MEDICATION     Red Yeast Rice 600 MG Tabs     vitamin C 500 MG tablet  Commonly known as:  ASCORBIC ACID      TAKE these medications        ciprofloxacin 500 MG tablet  Commonly known as:  CIPRO  Take 1 tablet (500 mg total) by mouth 2 (two) times daily. Start day prior to office visit for foley removal     fexofenadine 180 MG tablet  Commonly known as:  ALLEGRA  Take 180 mg by mouth daily.     fluticasone 50 MCG/ACT nasal spray  Commonly known as:  FLONASE  Place into both nostrils daily.     HYDROcodone-acetaminophen 5-325 MG per tablet  Commonly known as:  NORCO  Take 1-2 tablets by mouth every 6 (six) hours as needed.     niacin 500 MG tablet  Take 1,000 mg by mouth daily.        Followup: He will followup in 1 week for catheter removal and to discuss his surgical pathology results.

## 2014-06-21 NOTE — Plan of Care (Signed)
Problem: Phase II Progression Outcomes Goal: Ambulate in halls 4-6 x day Outcome: Completed/Met Date Met:  06/21/14

## 2014-06-21 NOTE — Plan of Care (Signed)
Problem: Phase II Progression Outcomes Goal: Pain controlled Outcome: Completed/Met Date Met:  06/21/14     

## 2014-06-21 NOTE — Plan of Care (Signed)
Problem: Phase II Progression Outcomes Goal: Other Phase II Outcomes/Goals Outcome: Not Applicable Date Met:  03/01/87

## 2014-06-21 NOTE — Plan of Care (Signed)
Problem: Phase I Progression Outcomes Goal: Pain controlled with appropriate interventions Outcome: Completed/Met Date Met:  06/21/14

## 2014-06-21 NOTE — Plan of Care (Signed)
Problem: Phase I Progression Outcomes Goal: Incision/dressing intact Outcome: Completed/Met Date Met:  06/21/14

## 2014-06-21 NOTE — Plan of Care (Signed)
Problem: Phase I Progression Outcomes Goal: Initial discharge plan identified Outcome: Completed/Met Date Met:  06/21/14

## 2014-06-21 NOTE — Plan of Care (Signed)
Problem: Phase I Progression Outcomes Goal: Adequate I & O Outcome: Completed/Met Date Met:  06/21/14

## 2014-06-21 NOTE — Plan of Care (Signed)
Problem: Phase I Progression Outcomes Goal: Foley/JP patent Outcome: Completed/Met Date Met:  06/21/14

## 2014-06-21 NOTE — Plan of Care (Signed)
Problem: Phase I Progression Outcomes Goal: Other Phase I Outcomes/Goals Outcome: Not Applicable Date Met:  09/79/49

## 2014-06-21 NOTE — Plan of Care (Signed)
Problem: Phase II Progression Outcomes Goal: Tolerates clear liquids POD #1 Outcome: Completed/Met Date Met:  06/21/14

## 2014-07-03 ENCOUNTER — Other Ambulatory Visit (HOSPITAL_COMMUNITY): Payer: Self-pay | Admitting: Urology

## 2014-07-03 DIAGNOSIS — M79669 Pain in unspecified lower leg: Secondary | ICD-10-CM

## 2014-07-04 ENCOUNTER — Encounter (HOSPITAL_COMMUNITY): Payer: Self-pay | Admitting: Emergency Medicine

## 2014-07-04 ENCOUNTER — Ambulatory Visit (HOSPITAL_COMMUNITY)
Admission: RE | Admit: 2014-07-04 | Discharge: 2014-07-04 | Disposition: A | Discharge status: Home / Self Care | Payer: BC Managed Care – PPO | Source: Ambulatory Visit | Attending: Urology | Admitting: Urology

## 2014-07-04 ENCOUNTER — Emergency Department (HOSPITAL_COMMUNITY)
Admission: EM | Admit: 2014-07-04 | Discharge: 2014-07-04 | Disposition: A | Discharge status: Home / Self Care | Payer: BC Managed Care – PPO | Attending: Emergency Medicine | Admitting: Emergency Medicine

## 2014-07-04 DIAGNOSIS — M79606 Pain in leg, unspecified: Secondary | ICD-10-CM

## 2014-07-04 DIAGNOSIS — I82402 Acute embolism and thrombosis of unspecified deep veins of left lower extremity: Secondary | ICD-10-CM | POA: Diagnosis not present

## 2014-07-04 DIAGNOSIS — Z79899 Other long term (current) drug therapy: Secondary | ICD-10-CM | POA: Insufficient documentation

## 2014-07-04 DIAGNOSIS — Z8546 Personal history of malignant neoplasm of prostate: Secondary | ICD-10-CM | POA: Diagnosis not present

## 2014-07-04 DIAGNOSIS — M79605 Pain in left leg: Secondary | ICD-10-CM | POA: Diagnosis present

## 2014-07-04 DIAGNOSIS — Z7951 Long term (current) use of inhaled steroids: Secondary | ICD-10-CM | POA: Diagnosis not present

## 2014-07-04 DIAGNOSIS — M79669 Pain in unspecified lower leg: Secondary | ICD-10-CM

## 2014-07-04 DIAGNOSIS — Z7901 Long term (current) use of anticoagulants: Secondary | ICD-10-CM | POA: Insufficient documentation

## 2014-07-04 DIAGNOSIS — M79609 Pain in unspecified limb: Secondary | ICD-10-CM

## 2014-07-04 DIAGNOSIS — Z792 Long term (current) use of antibiotics: Secondary | ICD-10-CM | POA: Insufficient documentation

## 2014-07-04 LAB — I-STAT CHEM 8, ED
BUN: 17 mg/dL (ref 6–23)
Calcium, Ion: 1.16 mmol/L (ref 1.12–1.23)
Chloride: 102 mEq/L (ref 96–112)
Creatinine, Ser: 0.9 mg/dL (ref 0.50–1.35)
Glucose, Bld: 91 mg/dL (ref 70–99)
HCT: 43 % (ref 39.0–52.0)
Hemoglobin: 14.6 g/dL (ref 13.0–17.0)
Potassium: 4.6 mEq/L (ref 3.7–5.3)
Sodium: 137 mEq/L (ref 137–147)
TCO2: 26 mmol/L (ref 0–100)

## 2014-07-04 LAB — PROTIME-INR
INR: 1.06 (ref 0.00–1.49)
Prothrombin Time: 14 seconds (ref 11.6–15.2)

## 2014-07-04 LAB — APTT: aPTT: 36 seconds (ref 24–37)

## 2014-07-04 MED ORDER — XARELTO VTE STARTER PACK 15 & 20 MG PO TBPK: 15.0000 mg | ORAL_TABLET | ORAL | Status: DC

## 2014-07-04 NOTE — Discharge Instructions (Signed)
Please call your doctor for a followup appointment within 24-48 hours. When you talk to your doctor please let them know that you were seen in the emergency department and have them acquire all of your records so that they can discuss the findings with you and formulate a treatment plan to fully care for your new and ongoing problems.  If you develop any bleeding from your nose, urine or rectum, return to the ER immediately.

## 2014-07-04 NOTE — Progress Notes (Signed)
*  Preliminary Results* Bilateral lower extremity venous duplex completed. The right lower extremity is negative for deep vein thrombosis. The left lower extremity is positive for deep vein thrombosis involving the left peroneal veins. There is no evidence of Baker's cyst bilaterally.  Preliminary results have been discussed with Nira Conn, RN of Dr.Borden's office. She has spoken with Dr.Borden who has advised the patient to visit the emergency department for treatment.  07/04/2014  Maudry Mayhew, RVT, RDCS, RDMS

## 2014-07-04 NOTE — ED Notes (Signed)
C/o 5-6 days of pain, cramping LLE, pos DVT on [place] today, denies CP/SOB, leg warm and dry, A/O X4 and in NAD

## 2014-07-04 NOTE — ED Provider Notes (Signed)
CSN: [id number]     Arrival date & time 07/04/14  1140 History   First MD Initiated Contact with Patient 07/04/14 1341     Chief Complaint  Patient presents with  . DVT     (Consider location/radiation/quality/duration/timing/severity/associated sxs/prior Treatment) HPI   The patient is a 58 year old male, he has a history of prostate cancer, recently underwent prostatectomy 2 weeks ago and several days ago developed a pain in his left lower extremity, this is a cramping and aching pain, he denies any rashes swelling shortness of breath chest pain or any other complaints. He has developed worsening symptoms thus he had an ultrasound ordered by the urologist this morning which confirmed that he had a lower extremity venous thrombosis in the peroneal vein on the left. He has no other risk factors for pulmonary embolism other than recent surgery and immobilization since surgery and prostate cancer  Past Medical History  Diagnosis Date  . Cancer     prostate cancer    Past Surgical History  Procedure Laterality Date  . Tonsillectomy      to correct sleep apnea   . Robot assisted laparoscopic radical prostatectomy N/A 06/20/2014    Procedure: ROBOTIC ASSISTED LAPAROSCOPIC RADICAL PROSTATECTOMY LEVEL 2;  Surgeon: Raynelle Bring, MD;  Location: [facility];  Service: Urology;  Laterality: N/A;  . Lymphadenectomy Bilateral 06/20/2014    Procedure: LYMPHADENECTOMY;  Surgeon: Raynelle Bring, MD;  Location: [facility];  Service: Urology;  Laterality: Bilateral;   No family history on file. History  Substance Use Topics  . Smoking status: Never Smoker   . Smokeless tobacco: Never Used  . Alcohol Use: Yes     Comment: occasional beer or wine     Review of Systems  All other systems reviewed and are negative.     Allergies  Sulfa antibiotics  Home Medications   Prior to Admission medications   Medication Sig Start Date End Date Taking? Authorizing Provider  aspirin EC 81 MG tablet Take 81 mg  by mouth daily.   Yes Historical Provider, MD  fexofenadine (ALLEGRA) 180 MG tablet Take 180 mg by mouth daily.   Yes Historical Provider, MD  fluticasone (FLONASE) 50 MCG/ACT nasal spray Place into both nostrils daily.   Yes Historical Provider, MD  Multiple Vitamin (MULTIVITAMIN WITH MINERALS) TABS tablet Take 1 tablet by mouth daily.   Yes Historical Provider, MD  niacin 500 MG tablet Take 1,000 mg by mouth daily.   Yes Historical Provider, MD  Omega-3 Fatty Acids (FISH OIL) 1000 MG CAPS Take by mouth daily.   Yes Historical Provider, MD  Plant Sterols and Stanols (CHOLEST OFF PO) Take by mouth daily.   Yes Historical Provider, MD  ciprofloxacin (CIPRO) 500 MG tablet Take 1 tablet (500 mg total) by mouth 2 (two) times daily. Start day prior to office visit for foley removal Patient not taking: Reported on 07/04/2014 06/20/14   Debbrah Alar, PA-C  HYDROcodone-acetaminophen (NORCO) 5-325 MG per tablet Take 1-2 tablets by mouth every 6 (six) hours as needed. Patient not taking: Reported on 07/04/2014 06/20/14   Debbrah Alar, PA-C  XARELTO STARTER PACK 15 & 20 MG TBPK Take 15-20 mg by mouth as directed. Take as directed on package: Start with one 15mg  tablet by mouth twice a day with food. On Day 22, switch to one 20mg  tablet once a day with food. 07/04/14   Johnna Acosta, MD   BP 149/86 mmHg  Pulse 84  Temp(Src) 98.1 F (36.7 C) (Oral)  Resp 16  Ht 5\' 10"  (1.778 m)  Wt 205 lb (92.987 kg)  BMI 29.41 kg/m2  SpO2 97% Physical Exam  Constitutional: He appears well-developed and well-nourished. No distress.  HENT:  Head: Normocephalic and atraumatic.  Mouth/Throat: Oropharynx is clear and moist. No oropharyngeal exudate.  Eyes: Conjunctivae and EOM are normal. Pupils are equal, round, and reactive to light. Right eye exhibits no discharge. Left eye exhibits no discharge. No scleral icterus.  Neck: Normal range of motion. Neck supple. No JVD present. No thyromegaly present.  Cardiovascular:  Normal rate, regular rhythm, normal heart sounds and intact distal pulses.  Exam reveals no gallop and no friction rub.   No murmur heard. Pulmonary/Chest: Effort normal and breath sounds normal. No respiratory distress. He has no wheezes. He has no rales.  Abdominal: Soft. Bowel sounds are normal. He exhibits no distension and no mass. There is no tenderness.  Musculoskeletal: Normal range of motion. He exhibits tenderness ( Mild tendernessto the LLE - no edema, no asymetry). He exhibits no edema.  Lymphadenopathy:    He has no cervical adenopathy.  Neurological: He is alert. Coordination normal.  Skin: Skin is warm and dry. No rash noted. No erythema.  Psychiatric: He has a normal mood and affect. His behavior is normal.  Nursing note and vitals reviewed.   ED Course  Procedures (including critical care time) Labs Review Labs Reviewed  APTT  PROTIME-INR  I-STAT CHEM 8, ED    Imaging Review No results found.    MDM   Final diagnoses:  DVT (deep venous thrombosis), left    The patient has a new DVT of the left lower extremity, given his history of risk factors I will start treating him, I will discuss with urology and pharmacy regarding dosing, I have described in detail to the patient the different medications including Coumadin and alternative novel oral anticoagulants, he has agreed to Xarelto  He has no symptoms of pulmonary embolism including no shortness of breath chest pain hypoxia tachycardia or hypotension.  D/w Urology by phone while in OR - no contraindications to anticoag, d/w pharmacy - no need to alter dosing of Xarelto  Johnna Acosta, MD 07/04/14 [phone]

## 2014-08-15 ENCOUNTER — Other Ambulatory Visit: Payer: Self-pay | Admitting: Family Medicine

## 2014-08-15 DIAGNOSIS — I779 Disorder of arteries and arterioles, unspecified: Secondary | ICD-10-CM

## 2014-08-20 ENCOUNTER — Ambulatory Visit
Admission: RE | Admit: 2014-08-20 | Discharge: 2014-08-20 | Disposition: A | Discharge status: Home / Self Care | Payer: BC Managed Care – PPO | Source: Ambulatory Visit | Attending: Family Medicine | Admitting: Family Medicine

## 2014-08-20 DIAGNOSIS — I779 Disorder of arteries and arterioles, unspecified: Secondary | ICD-10-CM

## 2015-12-20 IMAGING — CR DG CHEST 2V
2 series · 2 of 2 positions shown · non-contrast
Comparison: PA and lateral chest x-ray July 04, 2012

CLINICAL DATA: Preoperative exam prior prostatectomy; nonsmoker;
asymptomatic.

EXAM:
CHEST  2 VIEW

[w chest pa]
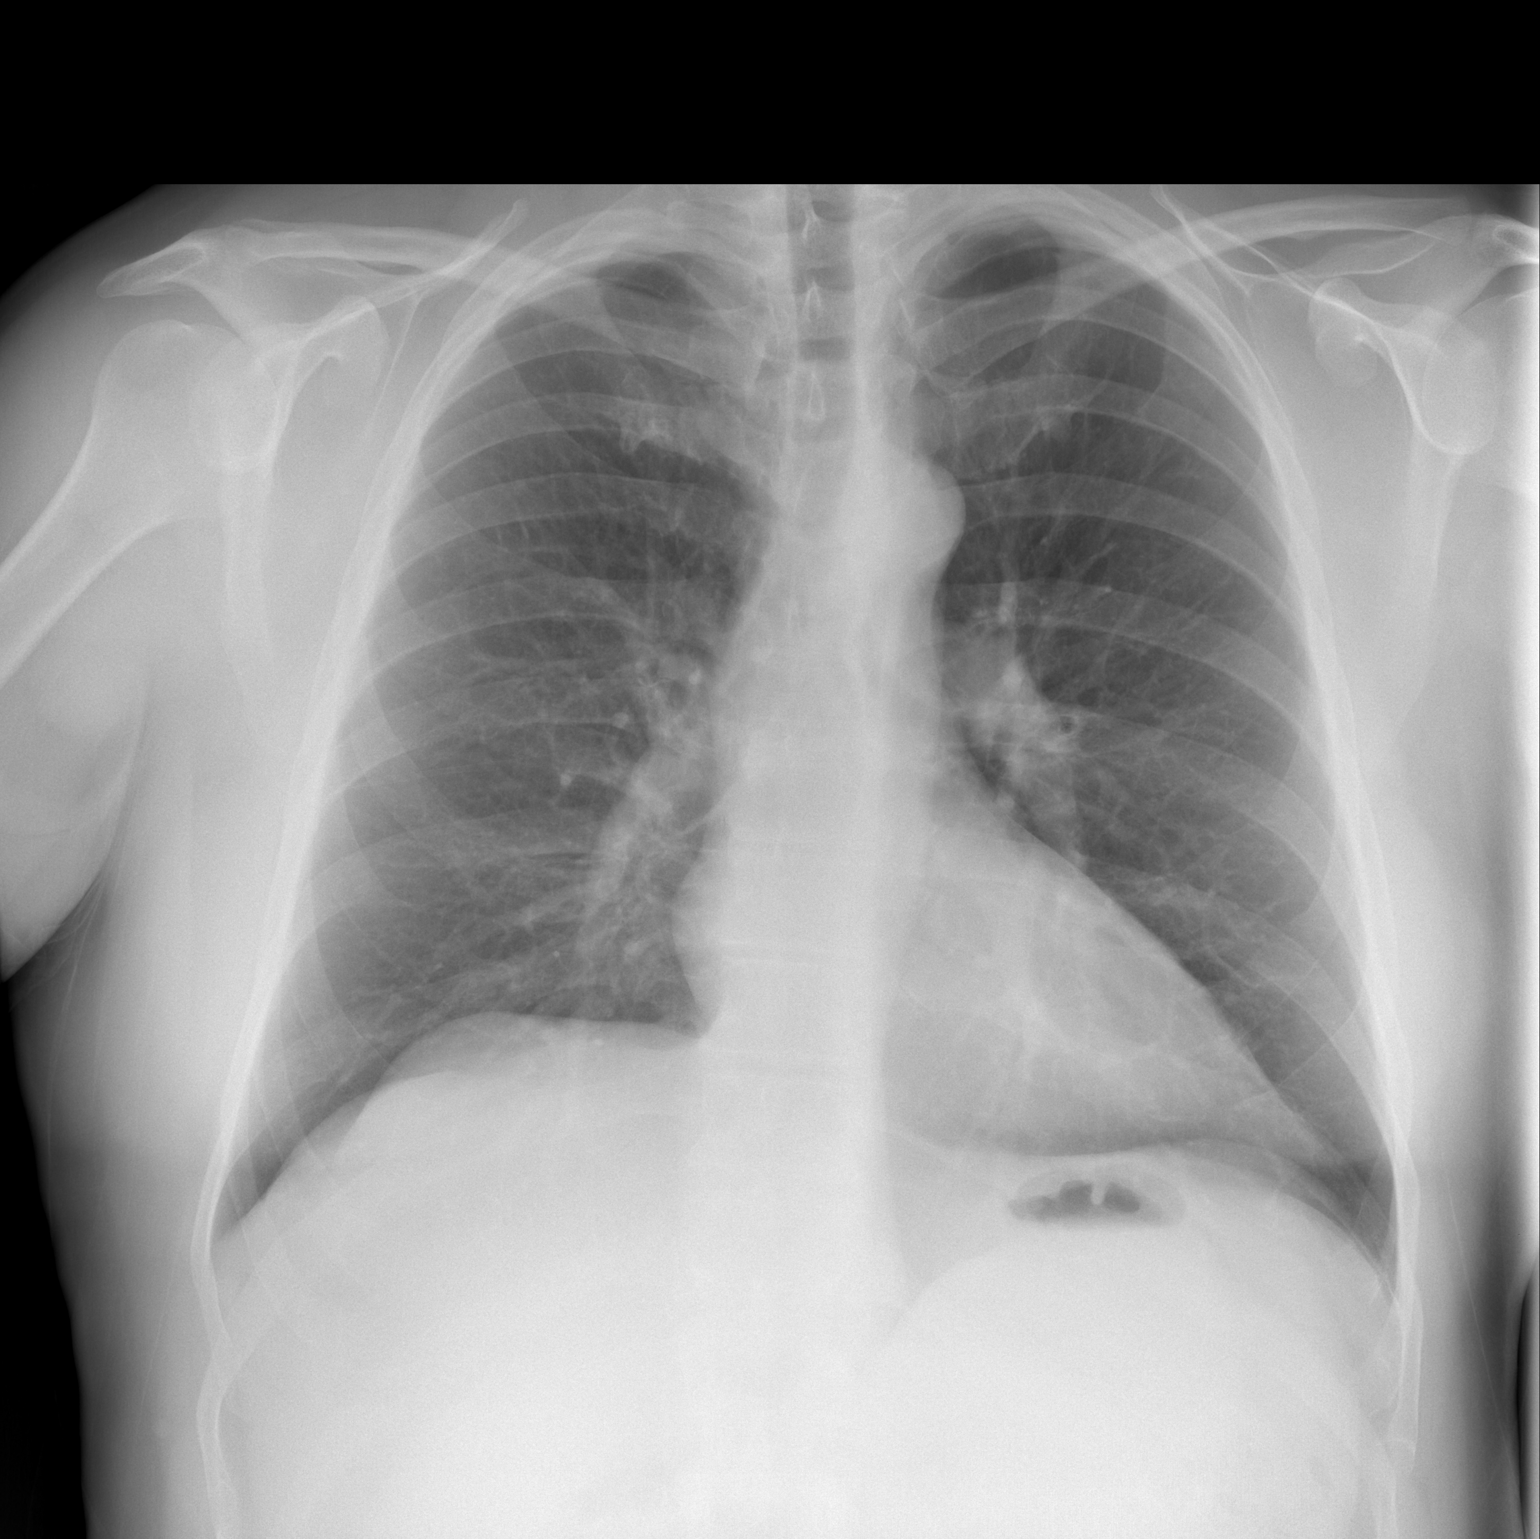

[w chest lat]
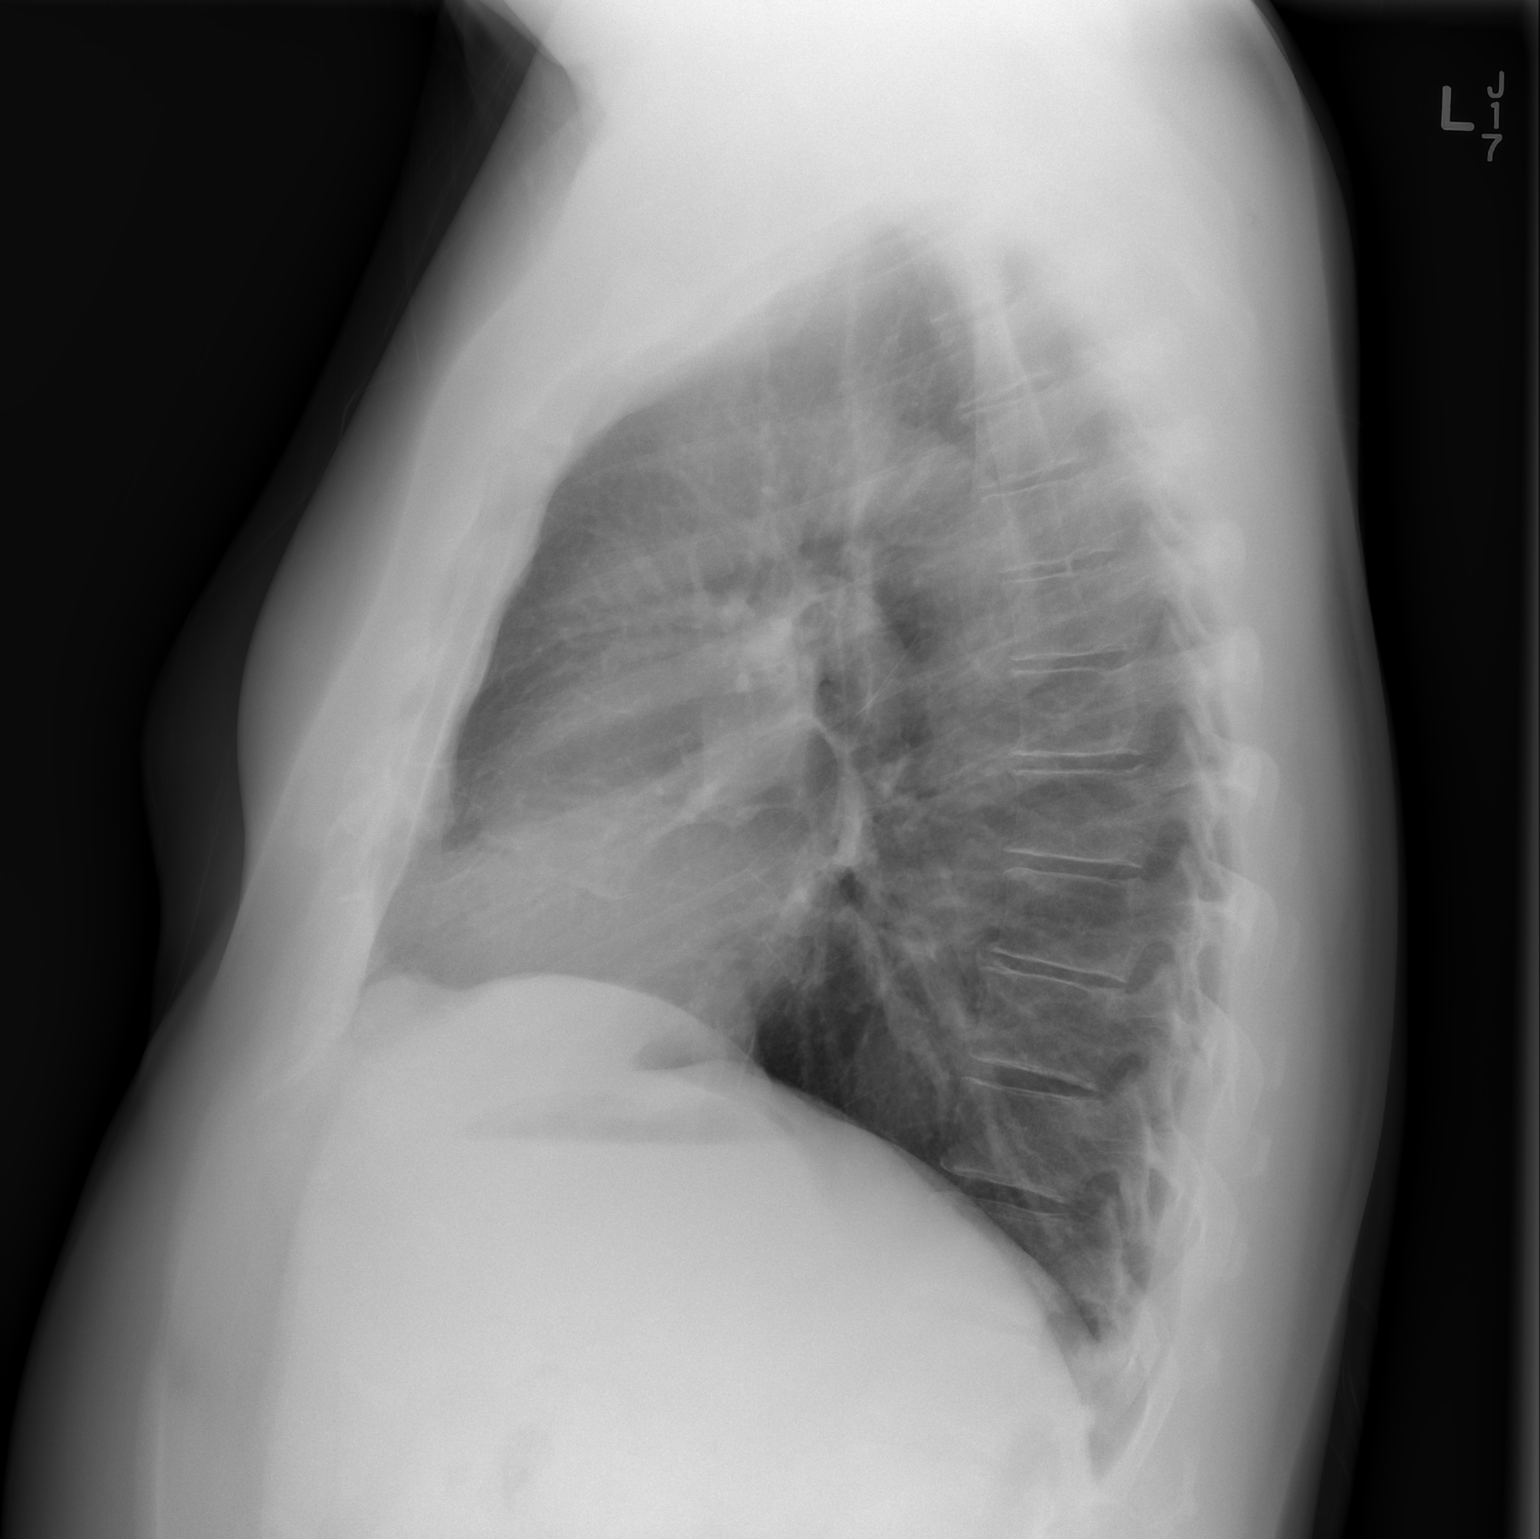

[2 of 2 positions shown; findings below may reference images not displayed]

FINDINGS: The lungs are well-expanded and clear. The heart and pulmonary
vascularity are normal. The mediastinum is normal in width. There is
no pleural effusion. The bony thorax is unremarkable.
IMPRESSION: There is no active cardiopulmonary disease.

## 2019-02-13 ENCOUNTER — Other Ambulatory Visit: Payer: Self-pay

## 2019-02-13 ENCOUNTER — Encounter (HOSPITAL_COMMUNITY): Payer: Self-pay

## 2019-02-13 ENCOUNTER — Emergency Department (HOSPITAL_COMMUNITY): Payer: BC Managed Care – PPO

## 2019-02-13 ENCOUNTER — Emergency Department (HOSPITAL_COMMUNITY)
Admission: EM | Admit: 2019-02-13 | Discharge: 2019-02-13 | Disposition: A | Discharge status: Home / Self Care | Payer: BC Managed Care – PPO | Attending: Emergency Medicine | Admitting: Emergency Medicine

## 2019-02-13 DIAGNOSIS — R509 Fever, unspecified: Secondary | ICD-10-CM | POA: Insufficient documentation

## 2019-02-13 DIAGNOSIS — R05 Cough: Secondary | ICD-10-CM | POA: Insufficient documentation

## 2019-02-13 DIAGNOSIS — Z79899 Other long term (current) drug therapy: Secondary | ICD-10-CM | POA: Insufficient documentation

## 2019-02-13 DIAGNOSIS — Z7982 Long term (current) use of aspirin: Secondary | ICD-10-CM | POA: Diagnosis not present

## 2019-02-13 DIAGNOSIS — Z20822 Contact with and (suspected) exposure to covid-19: Secondary | ICD-10-CM

## 2019-02-13 MED ORDER — ACETAMINOPHEN 325 MG PO TABS
650.0000 mg | ORAL_TABLET | Freq: Once | ORAL | Status: AC
  Administered 2019-02-13: 21:00:00 650 mg via ORAL
  Filled 2019-02-13: qty 2

## 2019-02-13 MED ORDER — PREDNISONE 20 MG PO TABS
60.0000 mg | ORAL_TABLET | Freq: Once | ORAL | Status: AC
  Administered 2019-02-13: 60 mg via ORAL
  Filled 2019-02-13: qty 3

## 2019-02-13 MED ORDER — PREDNISONE 10 MG PO TABS: 40.0000 mg | ORAL_TABLET | Freq: Every day | ORAL | 0 refills | Status: DC

## 2019-02-13 NOTE — Discharge Instructions (Signed)
Tylenol as needed.  Take the prednisone as directed to help suppress the cough.  Monitor yourself carefully over the next several days.  Any difficulty with breathing or feeling air hungry get seen immediately.  The COVID test you had done yesterday you should have results over the next couple days.  Today's chest x-ray was negative for any pneumonia.  Oxygen levels are fine.

## 2019-02-13 NOTE — ED Triage Notes (Addendum)
Patient c/o cough and fever.   102.2 days ago and took tylenol.  100.0 this morning.   Patient and wife had covid test today. Results not back.  Denies shob and chest pain.    94%RA. Fluctuates between 92-96% on RA  p-99 101 temp in triage.

## 2019-02-13 NOTE — ED Provider Notes (Signed)
[place] DEPT Provider Note   CSN: [id number] Arrival date & time: 02/13/19  1828     History   Chief Complaint Chief Complaint  Patient presents with   Cough   Fever    HPI Ronald Arnold is a 63 y.o. male.     Patient with symptoms not inconsistent with COVID infection.  Patient wife has similar symptoms.  They both had outpatient test done yesterday.  Patient onset of symptoms were on Wednesday.  He has been having fevers anywhere from 100-1 02.  Cough.  Does not feel short of breath.  No significant congestion.  Main complaints cough and fever.  The cough is been very persistent sometimes productive.  Patient's past medical history significant for history of prostate cancer.  Does not have diabetes.  Never smoked.  Also patient is denying chest pain.     Past Medical History:  Diagnosis Date   Cancer [facility])    prostate cancer     Patient Active Problem List   Diagnosis Date Noted   Prostate cancer ([place]) 06/20/2014    Past Surgical History:  Procedure Laterality Date   LYMPHADENECTOMY Bilateral 06/20/2014   Procedure: LYMPHADENECTOMY;  Surgeon: Raynelle Bring, MD;  Location: [facility] ORS;  Service: Urology;  Laterality: Bilateral;   ROBOT ASSISTED LAPAROSCOPIC RADICAL PROSTATECTOMY N/A 06/20/2014   Procedure: ROBOTIC ASSISTED LAPAROSCOPIC RADICAL PROSTATECTOMY LEVEL 2;  Surgeon: Raynelle Bring, MD;  Location: [facility];  Service: Urology;  Laterality: N/A;   TONSILLECTOMY     to correct sleep apnea         Home Medications    Prior to Admission medications   Medication Sig Start Date End Date Taking? Authorizing Provider  aspirin EC 81 MG tablet Take 81 mg by mouth daily.    [provider]  ciprofloxacin (CIPRO) 500 MG tablet Take 1 tablet (500 mg total) by mouth 2 (two) times daily. Start day prior to office visit for foley removal Patient not taking: Reported on 07/04/2014 06/20/14   Debbrah Alar, PA-C  fexofenadine  (ALLEGRA) 180 MG tablet Take 180 mg by mouth daily.    [provider]  fluticasone (FLONASE) 50 MCG/ACT nasal spray Place into both nostrils daily.    [provider]  HYDROcodone-acetaminophen (NORCO) 5-325 MG per tablet Take 1-2 tablets by mouth every 6 (six) hours as needed. Patient not taking: Reported on 07/04/2014 06/20/14   Debbrah Alar, PA-C  Multiple Vitamin (MULTIVITAMIN WITH MINERALS) TABS tablet Take 1 tablet by mouth daily.    [provider]  niacin 500 MG tablet Take 1,000 mg by mouth daily.    [provider]  Omega-3 Fatty Acids (FISH OIL) 1000 MG CAPS Take by mouth daily.    [provider]  Plant Sterols and Stanols (CHOLEST OFF PO) Take by mouth daily.    [provider]  predniSONE (DELTASONE) 10 MG tablet Take 4 tablets (40 mg total) by mouth daily. 02/13/19   Fredia Sorrow, MD  XARELTO STARTER PACK 15 & 20 MG TBPK Take 15-20 mg by mouth as directed. Take as directed on package: Start with one 15mg  tablet by mouth twice a day with food. On Day 22, switch to one 20mg  tablet once a day with food. 07/04/14   Noemi Chapel, MD    Family History History reviewed. No pertinent family history.  Social History Social History   Tobacco Use   Smoking status: Never Smoker   Smokeless tobacco: Never Used  Substance Use  Topics   Alcohol use: Yes    Comment: occasional beer or wine    Drug use: No     Allergies   Sulfa antibiotics   Review of Systems Review of Systems  Constitutional: Positive for fever. Negative for chills.  HENT: Negative for rhinorrhea and sore throat.   Eyes: Negative for visual disturbance.  Respiratory: Positive for cough. Negative for shortness of breath.   Cardiovascular: Negative for chest pain and leg swelling.  Gastrointestinal: Negative for abdominal pain, diarrhea, nausea and vomiting.  Genitourinary: Negative for dysuria.  Musculoskeletal: Negative for back pain and neck pain.    Skin: Negative for rash.  Neurological: Negative for dizziness, light-headedness and headaches.  Hematological: Does not bruise/bleed easily.  Psychiatric/Behavioral: Negative for confusion.     Physical Exam Updated Vital Signs BP (!) 168/89 (BP Location: Left Arm)    Pulse 99    Temp (!) 101 F (38.3 C) (Oral)    Resp (!) 23    SpO2 94%   Physical Exam Vitals signs and nursing note reviewed.  Constitutional:      Appearance: Normal appearance. He is well-developed.  HENT:     Head: Normocephalic and atraumatic.  Eyes:     Extraocular Movements: Extraocular movements intact.     Conjunctiva/sclera: Conjunctivae normal.     Pupils: Pupils are equal, round, and reactive to light.  Neck:     Musculoskeletal: Normal range of motion and neck supple.  Cardiovascular:     Rate and Rhythm: Normal rate and regular rhythm.     Heart sounds: No murmur.  Pulmonary:     Effort: Pulmonary effort is normal. No respiratory distress.     Breath sounds: Normal breath sounds.  Abdominal:     Palpations: Abdomen is soft.     Tenderness: There is no abdominal tenderness.  Musculoskeletal:        General: No swelling.  Skin:    General: Skin is warm and dry.  Neurological:     General: No focal deficit present.     Mental Status: He is alert and oriented to person, place, and time.      ED Treatments / Results  Labs (all labs ordered are listed, but only abnormal results are displayed) Labs Reviewed - No data to display  EKG None  Radiology Dg Chest [facility] 1 View  Result Date: 02/13/2019 CLINICAL DATA:  Fever and cough EXAM: PORTABLE CHEST 1 VIEW COMPARISON:  June 17, 2014. FINDINGS: The heart size and mediastinal contours are within normal limits. Linear streaky opacities seen at the left lung base likely atelectasis. No large airspace consolidation or pleural effusion. The visualized skeletal structures are unremarkable. IMPRESSION: Linear atelectasis at the left base.  Electronically Signed   By: Prudencio Pair M.D.   On: 02/13/2019 20:27    Procedures Procedures (including critical care time)  Medications Ordered in ED Medications  predniSONE (DELTASONE) tablet 60 mg (has no administration in time range)  acetaminophen (TYLENOL) tablet 650 mg (650 mg Oral Given 02/13/19 2033)     Initial Impression / Assessment and Plan / ED Course  I have reviewed the triage vital signs and the nursing notes.  Pertinent labs & imaging results that were available during my care of the patient were reviewed by me and considered in my medical decision making (see chart for details).        Patient symptoms not inconsistent with COVID-19 infection.  Also patient's wife with similar symptoms.  Patient now currently  on day 6 of the symptoms.  No hypoxia.  Chest x-ray negative for pneumonia.  But patient has had fever and cough.  Patient's main complaint from symptoms is the cough.  Recommend Tylenol for the fevers and will put him on a course of prednisone for the cough.  Patient had outpatient COVID testing done yesterday.  Should have his results in 1 or 2 days.  Patient given precautions to return for any new or worse symptoms.  Patient not meeting any criteria for admission currently.  Patient did drive himself to the emergency department and is ambulatory.  Oxygen saturations were around 94%.  Final Clinical Impressions(s) / ED Diagnoses   Final diagnoses:  Suspected Covid-19 Virus Infection    ED Discharge Orders         Ordered    predniSONE (DELTASONE) 10 MG tablet  Daily     02/13/19 2122           Fredia Sorrow, MD 02/13/19 2130

## 2019-02-15 ENCOUNTER — Emergency Department (HOSPITAL_COMMUNITY): Payer: BC Managed Care – PPO

## 2019-02-15 ENCOUNTER — Emergency Department (HOSPITAL_COMMUNITY)
Admission: EM | Admit: 2019-02-15 | Discharge: 2019-02-15 | Disposition: A | Discharge status: Home / Self Care | Payer: BC Managed Care – PPO | Attending: Emergency Medicine | Admitting: Emergency Medicine

## 2019-02-15 ENCOUNTER — Other Ambulatory Visit: Payer: Self-pay

## 2019-02-15 ENCOUNTER — Encounter (HOSPITAL_COMMUNITY): Payer: Self-pay

## 2019-02-15 DIAGNOSIS — Z79899 Other long term (current) drug therapy: Secondary | ICD-10-CM | POA: Insufficient documentation

## 2019-02-15 DIAGNOSIS — R509 Fever, unspecified: Secondary | ICD-10-CM | POA: Diagnosis present

## 2019-02-15 DIAGNOSIS — Z7982 Long term (current) use of aspirin: Secondary | ICD-10-CM | POA: Diagnosis not present

## 2019-02-15 DIAGNOSIS — Z8546 Personal history of malignant neoplasm of prostate: Secondary | ICD-10-CM | POA: Insufficient documentation

## 2019-02-15 DIAGNOSIS — U071 COVID-19: Secondary | ICD-10-CM

## 2019-02-15 DIAGNOSIS — Z882 Allergy status to sulfonamides status: Secondary | ICD-10-CM | POA: Diagnosis not present

## 2019-02-15 LAB — COMPREHENSIVE METABOLIC PANEL
ALT: 46 U/L — ABNORMAL HIGH (ref 0–44)
AST: 68 U/L — ABNORMAL HIGH (ref 15–41)
Albumin: 3.7 g/dL (ref 3.5–5.0)
Alkaline Phosphatase: 43 U/L (ref 38–126)
Anion gap: 12 (ref 5–15)
BUN: 20 mg/dL (ref 8–23)
CO2: 23 mmol/L (ref 22–32)
Calcium: 8 mg/dL — ABNORMAL LOW (ref 8.9–10.3)
Chloride: 100 mmol/L (ref 98–111)
Creatinine, Ser: 0.98 mg/dL (ref 0.61–1.24)
GFR calc Af Amer: >60 mL/min (ref >60)
GFR calc non Af Amer: >60 mL/min (ref >60)
Glucose, Bld: 129 mg/dL — ABNORMAL HIGH (ref 70–99)
Potassium: 4 mmol/L (ref 3.5–5.1)
Sodium: 135 mmol/L (ref 135–145)
Total Bilirubin: 0.5 mg/dL (ref 0.3–1.2)
Total Protein: 7.3 g/dL (ref 6.5–8.1)

## 2019-02-15 LAB — CBC WITH DIFFERENTIAL/PLATELET
Abs Immature Granulocytes: 0.02 10*3/uL (ref 0.00–0.07)
Basophils Absolute: 0 10*3/uL (ref 0.0–0.1)
Basophils Relative: 0 %
Eosinophils Absolute: 0 10*3/uL (ref 0.0–0.5)
Eosinophils Relative: 0 %
HCT: 46.6 % (ref 39.0–52.0)
Hemoglobin: 15.2 g/dL (ref 13.0–17.0)
Immature Granulocytes: 0 %
Lymphocytes Relative: 12 %
Lymphs Abs: 1 10*3/uL (ref 0.7–4.0)
MCH: 30.6 pg (ref 26.0–34.0)
MCHC: 32.6 g/dL (ref 30.0–36.0)
MCV: 94 fL (ref 80.0–100.0)
Monocytes Absolute: 0.5 10*3/uL (ref 0.1–1.0)
Monocytes Relative: 6 %
Neutro Abs: 6.4 10*3/uL (ref 1.7–7.7)
Neutrophils Relative %: 82 %
Platelets: 167 10*3/uL (ref 150–400)
RBC: 4.96 MIL/uL (ref 4.22–5.81)
RDW: 12.7 % (ref 11.5–15.5)
WBC: 7.9 10*3/uL (ref 4.0–10.5)
nRBC: 0 % (ref 0.0–0.2)

## 2019-02-15 MED ORDER — SODIUM CHLORIDE 0.9 % IV BOLUS
1000.0000 mL | Freq: Once | INTRAVENOUS | Status: AC
  Administered 2019-02-15: 1000 mL via INTRAVENOUS

## 2019-02-15 MED ORDER — ACETAMINOPHEN 325 MG PO TABS
650.0000 mg | ORAL_TABLET | Freq: Once | ORAL | Status: AC
  Administered 2019-02-15: 650 mg via ORAL
  Filled 2019-02-15: qty 2

## 2019-02-15 NOTE — Discharge Instructions (Signed)
Return to ER at any time for any difficulty breathing, shortness of breath, or other concerns. Close follow up with your doctor.

## 2019-02-15 NOTE — ED Notes (Signed)
ED Provider at bedside. 

## 2019-02-15 NOTE — ED Provider Notes (Signed)
[facility] DEPT Provider Note   CSN: [id number] Arrival date & time: 02/15/19  1032    History   Chief Complaint Chief Complaint  Patient presents with  . Cough  . Fever  . Fatigue    HPI Ronald Arnold is a 63 y.o. male.     63 year old male presents with a complaint of ongoing fever and cough.  Patient states that he tested positive for COVID a few days ago, symptoms started about 8 days ago.  Patient reports he was feeling a little bit better yesterday relative to how he has been feeling and woke up today feeling worse in general.  Patient has been watching his O2 sats at home and became concerned today when his sats were 88 to 89%, called his doctor was advised to come to emergency room for evaluation.  Patient denies feeling short of breath.  Reports prior diarrhea however this has resolved.  States his temperatures been consistently 101-102, takes 500 mg of Tylenol every 4 hours and is due for his next dose of Tylenol now.  Patient was seen in the emergency room 2 days ago, had a chest x-ray that was clear, was given prednisone and has taken this.  No history of high blood pressure, notes blood pressure is elevated today.  No other complaints or concerns.  Patient is a non-smoker, no history of chronic lung disease.     Past Medical History:  Diagnosis Date  . Cancer [facility])    prostate cancer     Patient Active Problem List   Diagnosis Date Noted  . Prostate cancer ([place]) 06/20/2014    Past Surgical History:  Procedure Laterality Date  . LYMPHADENECTOMY Bilateral 06/20/2014   Procedure: LYMPHADENECTOMY;  Surgeon: Raynelle Bring, MD;  Location: [facility];  Service: Urology;  Laterality: Bilateral;  . ROBOT ASSISTED LAPAROSCOPIC RADICAL PROSTATECTOMY N/A 06/20/2014   Procedure: ROBOTIC ASSISTED LAPAROSCOPIC RADICAL PROSTATECTOMY LEVEL 2;  Surgeon: Raynelle Bring, MD;  Location: [facility];  Service: Urology;  Laterality: N/A;  . TONSILLECTOMY     to  correct sleep apnea         Home Medications    Prior to Admission medications   Medication Sig Start Date End Date Taking? Authorizing Provider  aspirin EC 81 MG tablet Take 81 mg by mouth daily.    [provider]  ciprofloxacin (CIPRO) 500 MG tablet Take 1 tablet (500 mg total) by mouth 2 (two) times daily. Start day prior to office visit for foley removal Patient not taking: Reported on 07/04/2014 06/20/14   Debbrah Alar, PA-C  fexofenadine (ALLEGRA) 180 MG tablet Take 180 mg by mouth daily.    [provider]  fluticasone (FLONASE) 50 MCG/ACT nasal spray Place into both nostrils daily.    [provider]  HYDROcodone-acetaminophen (NORCO) 5-325 MG per tablet Take 1-2 tablets by mouth every 6 (six) hours as needed. Patient not taking: Reported on 07/04/2014 06/20/14   Debbrah Alar, PA-C  Multiple Vitamin (MULTIVITAMIN WITH MINERALS) TABS tablet Take 1 tablet by mouth daily.    [provider]  niacin 500 MG tablet Take 1,000 mg by mouth daily.    [provider]  Omega-3 Fatty Acids (FISH OIL) 1000 MG CAPS Take by mouth daily.    [provider]  Plant Sterols and Stanols (CHOLEST OFF PO) Take by mouth daily.    [provider]  predniSONE (DELTASONE) 10 MG tablet Take 4 tablets (40 mg total) by mouth daily. 02/13/19  Fredia Sorrow, MD  XARELTO STARTER PACK 15 & 20 MG TBPK Take 15-20 mg by mouth as directed. Take as directed on package: Start with one 15mg  tablet by mouth twice a day with food. On Day 22, switch to one 20mg  tablet once a day with food. 07/04/14   Noemi Chapel, MD    Family History History reviewed. No pertinent family history.  Social History Social History   Tobacco Use  . Smoking status: Never Smoker  . Smokeless tobacco: Never Used  Substance Use Topics  . Alcohol use: Yes    Comment: occasional beer or wine   . Drug use: No     Allergies   Sulfa antibiotics   Review of Systems  Review of Systems  Constitutional: Positive for chills, fatigue and fever.  Respiratory: Positive for cough. Negative for shortness of breath.   Cardiovascular: Negative for chest pain.  Gastrointestinal: Negative for abdominal pain, constipation, diarrhea, nausea and vomiting.  Genitourinary: Negative for decreased urine volume.  Musculoskeletal: Positive for arthralgias and myalgias.  Skin: Negative for rash and wound.  Allergic/Immunologic: Negative for immunocompromised state.  Neurological: Negative for weakness.  Hematological: Negative for adenopathy.  All other systems reviewed and are negative.    Physical Exam Updated Vital Signs BP (!) 168/98   Pulse 94   Temp (!) 102.9 F (39.4 C) (Oral)   Resp 18   SpO2 91%   Physical Exam Vitals signs and nursing note reviewed.  Constitutional:      General: He is not in acute distress.    Appearance: He is well-developed. He is not diaphoretic.  HENT:     Head: Normocephalic and atraumatic.  Cardiovascular:     Rate and Rhythm: Regular rhythm. Tachycardia present.     Pulses: Normal pulses.     Heart sounds: Normal heart sounds.  Pulmonary:     Effort: Pulmonary effort is normal. No respiratory distress.     Breath sounds: Normal breath sounds. No wheezing.  Skin:    General: Skin is warm and dry.     Findings: No erythema or rash.  Neurological:     Mental Status: He is alert and oriented to person, place, and time.  Psychiatric:        Behavior: Behavior normal.      ED Treatments / Results  Labs (all labs ordered are listed, but only abnormal results are displayed) Labs Reviewed  COMPREHENSIVE METABOLIC PANEL - Abnormal; Notable for the following components:      Result Value   Glucose, Bld 129 (*)    Calcium 8.0 (*)    AST 68 (*)    ALT 46 (*)    All other components within normal limits  CBC WITH DIFFERENTIAL/PLATELET  URINALYSIS, ROUTINE W REFLEX MICROSCOPIC    EKG None  Radiology Dg Chest Port  1 View  Result Date: 02/15/2019 CLINICAL DATA:  Cough and shortness of breath, COVID-19 positive. EXAM: PORTABLE CHEST 1 VIEW COMPARISON:  February 13, 2019 FINDINGS: The heart size and mediastinal contours are within normal limits. Hazy patchy ground-glass opacity is identified in the lateral right mid lung and in bilateral lung bases. No pleural effusion or pulmonary edema is identified. The visualized skeletal structures are unremarkable. IMPRESSION: Hazy patchy ground-glass opacity is identified in the bilateral lung. These findings can correlate to patient's known COVID-19 infection. Electronically Signed   By: Abelardo Diesel M.D.   On: 02/15/2019 12:43   Dg Chest Port 1 View  Result Date: 02/13/2019 CLINICAL  DATA:  Fever and cough EXAM: PORTABLE CHEST 1 VIEW COMPARISON:  June 17, 2014. FINDINGS: The heart size and mediastinal contours are within normal limits. Linear streaky opacities seen at the left lung base likely atelectasis. No large airspace consolidation or pleural effusion. The visualized skeletal structures are unremarkable. IMPRESSION: Linear atelectasis at the left base. Electronically Signed   By: Prudencio Pair M.D.   On: 02/13/2019 20:27    Procedures Procedures (including critical care time)  Medications Ordered in ED Medications  acetaminophen (TYLENOL) tablet 650 mg (650 mg Oral Given 02/15/19 1136)  sodium chloride 0.9 % bolus 1,000 mL (0 mLs Intravenous Stopped 02/15/19 1352)     Initial Impression / Assessment and Plan / ED Course  I have reviewed the triage vital signs and the nursing notes.  Pertinent labs & imaging results that were available during my care of the patient were reviewed by me and considered in my medical decision making (see chart for details).  Clinical Course as of Feb 15 1432  Thu Feb 15, 3912  1074 63 year old male with known positive comatose presents with concern for low pulse ox reading at home.  Patient states that he was feeling better  yesterday relative to how he has been feeling for the past week and then today began to feel unwell again.  Patient reports cough with fever, denies shortness of breath or difficulty breathing.  Patient is using home pulse ox, reports O2 sats at home of 88 and 89%, called his PCP and was advised to come to the ER for evaluation.  On exam patient is well-appearing, tachycardic, room air sats around 94% on average.  Chest x-ray shows bilateral groundglass opacities consistent with COVID-19, review of lab work, CBC is unremarkable, CMP with slight increase in liver enzymes with AST of 68, ALT of 46.  Patient continues to do well, he is not tachypneic, tachycardia and fever have improved with Tylenol and fluids.  Patient is ambulatory around his room for several laps, O2 sat as low as 88% quickly returns to the low 90s.  During the exercise, patient does not become short of breath.  Shared decision making with patient, discussed option of admission to the [place] or continue to monitor at home.  Plan is to monitor symptoms at home, return to ER for shortness of breath or any respiratory concerns.   [LM]    Clinical Course User Index [LM] Tacy Learn, PA-C      Final Clinical Impressions(s) / ED Diagnoses   Final diagnoses:  [id number]    ED Discharge Orders    None       Tacy Learn, PA-C 02/15/19 1433    Maudie Flakes, MD 02/16/19 [phone]

## 2019-02-15 NOTE — ED Triage Notes (Signed)
Pt c/o continuing cough, fever, low Sats, and fatigue. Denies SOB or pain. Both he and wife Dx Covid Positive on Tuesday, seen, given meds and sent home.

## 2019-02-17 ENCOUNTER — Emergency Department (HOSPITAL_COMMUNITY): Payer: BC Managed Care – PPO

## 2019-02-17 ENCOUNTER — Encounter (HOSPITAL_COMMUNITY): Payer: Self-pay

## 2019-02-17 ENCOUNTER — Inpatient Hospital Stay (HOSPITAL_COMMUNITY)
Admission: EM | Admit: 2019-02-17 | Discharge: 2019-03-04 | DRG: 177 | Disposition: A | Discharge status: Home / Self Care | Payer: BC Managed Care – PPO | Attending: Internal Medicine | Admitting: Internal Medicine

## 2019-02-17 ENCOUNTER — Other Ambulatory Visit: Payer: Self-pay

## 2019-02-17 DIAGNOSIS — Z23 Encounter for immunization: Secondary | ICD-10-CM

## 2019-02-17 DIAGNOSIS — J13 Pneumonia due to Streptococcus pneumoniae: Secondary | ICD-10-CM | POA: Diagnosis present

## 2019-02-17 DIAGNOSIS — Z833 Family history of diabetes mellitus: Secondary | ICD-10-CM

## 2019-02-17 DIAGNOSIS — E785 Hyperlipidemia, unspecified: Secondary | ICD-10-CM | POA: Diagnosis present

## 2019-02-17 DIAGNOSIS — I10 Essential (primary) hypertension: Secondary | ICD-10-CM | POA: Diagnosis present

## 2019-02-17 DIAGNOSIS — R0902 Hypoxemia: Secondary | ICD-10-CM

## 2019-02-17 DIAGNOSIS — Z7982 Long term (current) use of aspirin: Secondary | ICD-10-CM | POA: Diagnosis not present

## 2019-02-17 DIAGNOSIS — Z9079 Acquired absence of other genital organ(s): Secondary | ICD-10-CM

## 2019-02-17 DIAGNOSIS — R042 Hemoptysis: Secondary | ICD-10-CM | POA: Diagnosis not present

## 2019-02-17 DIAGNOSIS — R945 Abnormal results of liver function studies: Secondary | ICD-10-CM | POA: Diagnosis not present

## 2019-02-17 DIAGNOSIS — E669 Obesity, unspecified: Secondary | ICD-10-CM | POA: Diagnosis present

## 2019-02-17 DIAGNOSIS — J1289 Other viral pneumonia: Secondary | ICD-10-CM | POA: Diagnosis present

## 2019-02-17 DIAGNOSIS — R7303 Prediabetes: Secondary | ICD-10-CM | POA: Diagnosis present

## 2019-02-17 DIAGNOSIS — R739 Hyperglycemia, unspecified: Secondary | ICD-10-CM | POA: Diagnosis present

## 2019-02-17 DIAGNOSIS — Z9981 Dependence on supplemental oxygen: Secondary | ICD-10-CM | POA: Diagnosis not present

## 2019-02-17 DIAGNOSIS — Z8546 Personal history of malignant neoplasm of prostate: Secondary | ICD-10-CM | POA: Diagnosis not present

## 2019-02-17 DIAGNOSIS — Z8249 Family history of ischemic heart disease and other diseases of the circulatory system: Secondary | ICD-10-CM

## 2019-02-17 DIAGNOSIS — J9601 Acute respiratory failure with hypoxia: Secondary | ICD-10-CM | POA: Diagnosis present

## 2019-02-17 DIAGNOSIS — Z882 Allergy status to sulfonamides status: Secondary | ICD-10-CM | POA: Diagnosis not present

## 2019-02-17 DIAGNOSIS — K5900 Constipation, unspecified: Secondary | ICD-10-CM | POA: Diagnosis not present

## 2019-02-17 DIAGNOSIS — U071 COVID-19: Principal | ICD-10-CM

## 2019-02-17 DIAGNOSIS — Z7952 Long term (current) use of systemic steroids: Secondary | ICD-10-CM | POA: Diagnosis not present

## 2019-02-17 DIAGNOSIS — R740 Nonspecific elevation of levels of transaminase and lactic acid dehydrogenase [LDH]: Secondary | ICD-10-CM | POA: Diagnosis present

## 2019-02-17 DIAGNOSIS — Z86718 Personal history of other venous thrombosis and embolism: Secondary | ICD-10-CM

## 2019-02-17 DIAGNOSIS — E7800 Pure hypercholesterolemia, unspecified: Secondary | ICD-10-CM | POA: Diagnosis not present

## 2019-02-17 DIAGNOSIS — Z6830 Body mass index (BMI) 30.0-30.9, adult: Secondary | ICD-10-CM

## 2019-02-17 DIAGNOSIS — T380X5A Adverse effect of glucocorticoids and synthetic analogues, initial encounter: Secondary | ICD-10-CM | POA: Diagnosis present

## 2019-02-17 DIAGNOSIS — Z79899 Other long term (current) drug therapy: Secondary | ICD-10-CM

## 2019-02-17 DIAGNOSIS — K219 Gastro-esophageal reflux disease without esophagitis: Secondary | ICD-10-CM | POA: Diagnosis present

## 2019-02-17 DIAGNOSIS — J22 Unspecified acute lower respiratory infection: Secondary | ICD-10-CM | POA: Diagnosis present

## 2019-02-17 DIAGNOSIS — J80 Acute respiratory distress syndrome: Secondary | ICD-10-CM | POA: Diagnosis not present

## 2019-02-17 DIAGNOSIS — J1282 Pneumonia due to coronavirus disease 2019: Secondary | ICD-10-CM | POA: Diagnosis present

## 2019-02-17 HISTORY — DX: COVID-19: U07.1

## 2019-02-17 LAB — COMPREHENSIVE METABOLIC PANEL
ALT: 42 U/L (ref 0–44)
AST: 60 U/L — ABNORMAL HIGH (ref 15–41)
Albumin: 3 g/dL — ABNORMAL LOW (ref 3.5–5.0)
Alkaline Phosphatase: 34 U/L — ABNORMAL LOW (ref 38–126)
Anion gap: 12 (ref 5–15)
BUN: 18 mg/dL (ref 8–23)
CO2: 24 mmol/L (ref 22–32)
Calcium: 8 mg/dL — ABNORMAL LOW (ref 8.9–10.3)
Chloride: 97 mmol/L — ABNORMAL LOW (ref 98–111)
Creatinine, Ser: 0.93 mg/dL (ref 0.61–1.24)
GFR calc Af Amer: >60 mL/min (ref >60)
GFR calc non Af Amer: >60 mL/min (ref >60)
Glucose, Bld: 179 mg/dL — ABNORMAL HIGH (ref 70–99)
Potassium: 3.6 mmol/L (ref 3.5–5.1)
Sodium: 133 mmol/L — ABNORMAL LOW (ref 135–145)
Total Bilirubin: 0.6 mg/dL (ref 0.3–1.2)
Total Protein: 6.5 g/dL (ref 6.5–8.1)

## 2019-02-17 LAB — LACTIC ACID, PLASMA
Lactic Acid, Venous: 1.8 mmol/L (ref 0.5–1.9)
Lactic Acid, Venous: 2.1 mmol/L (ref 0.5–1.9)

## 2019-02-17 LAB — CBC WITH DIFFERENTIAL/PLATELET
Abs Immature Granulocytes: 0.02 10*3/uL (ref 0.00–0.07)
Basophils Absolute: 0 10*3/uL (ref 0.0–0.1)
Basophils Relative: 0 %
Eosinophils Absolute: 0 10*3/uL (ref 0.0–0.5)
Eosinophils Relative: 0 %
HCT: 40.5 % (ref 39.0–52.0)
Hemoglobin: 13.8 g/dL (ref 13.0–17.0)
Immature Granulocytes: 0 %
Lymphocytes Relative: 9 %
Lymphs Abs: 0.7 10*3/uL (ref 0.7–4.0)
MCH: 31.7 pg (ref 26.0–34.0)
MCHC: 34.1 g/dL (ref 30.0–36.0)
MCV: 92.9 fL (ref 80.0–100.0)
Monocytes Absolute: 0.3 10*3/uL (ref 0.1–1.0)
Monocytes Relative: 4 %
Neutro Abs: 6.6 10*3/uL (ref 1.7–7.7)
Neutrophils Relative %: 87 %
Platelets: 160 10*3/uL (ref 150–400)
RBC: 4.36 MIL/uL (ref 4.22–5.81)
RDW: 12.9 % (ref 11.5–15.5)
WBC: 7.5 10*3/uL (ref 4.0–10.5)
nRBC: 0 % (ref 0.0–0.2)

## 2019-02-17 LAB — BLOOD GAS, ARTERIAL
Acid-Base Excess: 2.2 mmol/L — ABNORMAL HIGH (ref 0.0–2.0)
Bicarbonate: 24.1 mmol/L (ref 20.0–28.0)
Drawn by: 232811
O2 Content: 9 L/min
O2 Saturation: 94 %
Patient temperature: 103.1
pCO2 arterial: 34.8 mmHg (ref 32.0–48.0)
pH, Arterial: 7.467 — ABNORMAL HIGH (ref 7.350–7.450)
pO2, Arterial: 78.8 mmHg — ABNORMAL LOW (ref 83.0–108.0)

## 2019-02-17 LAB — PROCALCITONIN: Procalcitonin: 0.33 ng/mL

## 2019-02-17 LAB — C-REACTIVE PROTEIN: CRP: 9.6 mg/dL — ABNORMAL HIGH (ref <1.0)

## 2019-02-17 LAB — FERRITIN: Ferritin: 711 ng/mL — ABNORMAL HIGH (ref 24–336)

## 2019-02-17 LAB — FIBRINOGEN: Fibrinogen: 626 mg/dL — ABNORMAL HIGH (ref 210–475)

## 2019-02-17 LAB — TRIGLYCERIDES: Triglycerides: 83 mg/dL (ref <150)

## 2019-02-17 LAB — LACTATE DEHYDROGENASE: LDH: 344 U/L — ABNORMAL HIGH (ref 98–192)

## 2019-02-17 LAB — D-DIMER, QUANTITATIVE: D-Dimer, Quant: 0.98 ug/mL-FEU — ABNORMAL HIGH (ref 0.00–0.50)

## 2019-02-17 MED ORDER — ONDANSETRON HCL 4 MG/2ML IJ SOLN: 4.0000 mg | Freq: Four times a day (QID) | INTRAMUSCULAR | Status: DC | PRN

## 2019-02-17 MED ORDER — HYDRALAZINE HCL 20 MG/ML IJ SOLN
5.0000 mg | Freq: Four times a day (QID) | INTRAMUSCULAR | Status: DC | PRN
  Administered 2019-02-17: 5 mg via INTRAVENOUS
  Filled 2019-02-17: qty 1

## 2019-02-17 MED ORDER — VITAMIN D 25 MCG (1000 UNIT) PO TABS
2000.0000 [IU] | ORAL_TABLET | Freq: Every day | ORAL | Status: DC
  Administered 2019-02-18 – 2019-03-04 (×15): 2000 [IU] via ORAL
  Filled 2019-02-17 (×16): qty 2

## 2019-02-17 MED ORDER — TOCILIZUMAB 400 MG/20ML IV SOLN
8.0000 mg/kg | Freq: Once | INTRAVENOUS | Status: AC
  Administered 2019-02-17: 744 mg via INTRAVENOUS
  Filled 2019-02-17: qty 37.2

## 2019-02-17 MED ORDER — DEXAMETHASONE 2 MG PO TABS
6.0000 mg | ORAL_TABLET | Freq: Every day | ORAL | Status: DC
  Administered 2019-02-17 – 2019-02-20 (×4): 6 mg via ORAL
  Filled 2019-02-17 (×2): qty 3
  Filled 2019-02-17 (×2): qty 1

## 2019-02-17 MED ORDER — VITAMIN C 500 MG PO TABS
1000.0000 mg | ORAL_TABLET | Freq: Every day | ORAL | Status: DC
  Administered 2019-02-18 – 2019-03-04 (×15): 1000 mg via ORAL
  Filled 2019-02-17 (×16): qty 2

## 2019-02-17 MED ORDER — SODIUM CHLORIDE 0.9 % IV BOLUS
250.0000 mL | Freq: Once | INTRAVENOUS | Status: AC
  Administered 2019-02-17: 250 mL via INTRAVENOUS

## 2019-02-17 MED ORDER — SODIUM CHLORIDE 0.9 % IV SOLN
100.0000 mg | INTRAVENOUS | Status: AC
  Administered 2019-02-18 – 2019-02-21 (×4): 100 mg via INTRAVENOUS
  Filled 2019-02-17 (×4): qty 20

## 2019-02-17 MED ORDER — ADULT MULTIVITAMIN W/MINERALS CH
1.0000 | ORAL_TABLET | Freq: Every day | ORAL | Status: DC
  Administered 2019-02-17 – 2019-03-04 (×16): 1 via ORAL
  Filled 2019-02-17 (×16): qty 1

## 2019-02-17 MED ORDER — ENOXAPARIN SODIUM 40 MG/0.4ML ~~LOC~~ SOLN
40.0000 mg | SUBCUTANEOUS | Status: DC
  Administered 2019-02-17: 40 mg via SUBCUTANEOUS
  Filled 2019-02-17: qty 0.4

## 2019-02-17 MED ORDER — HYDROCOD POLST-CPM POLST ER 10-8 MG/5ML PO SUER
5.0000 mL | Freq: Two times a day (BID) | ORAL | Status: DC | PRN
  Administered 2019-02-17 – 2019-03-03 (×13): 5 mL via ORAL
  Filled 2019-02-17 (×13): qty 5

## 2019-02-17 MED ORDER — HYDROCODONE-ACETAMINOPHEN 5-325 MG PO TABS: 1.0000 | ORAL_TABLET | ORAL | Status: DC | PRN

## 2019-02-17 MED ORDER — LORATADINE 10 MG PO TABS
10.0000 mg | ORAL_TABLET | Freq: Every day | ORAL | Status: DC
  Administered 2019-02-17 – 2019-03-04 (×16): 10 mg via ORAL
  Filled 2019-02-17 (×17): qty 1

## 2019-02-17 MED ORDER — SENNOSIDES-DOCUSATE SODIUM 8.6-50 MG PO TABS
1.0000 | ORAL_TABLET | Freq: Every evening | ORAL | Status: DC | PRN
  Administered 2019-02-25: 1 via ORAL
  Filled 2019-02-17: qty 1

## 2019-02-17 MED ORDER — ONDANSETRON HCL 4 MG PO TABS: 4.0000 mg | ORAL_TABLET | Freq: Four times a day (QID) | ORAL | Status: DC | PRN

## 2019-02-17 MED ORDER — SODIUM CHLORIDE 0.9 % IV SOLN
INTRAVENOUS | Status: AC
  Administered 2019-02-17 (×3): via INTRAVENOUS

## 2019-02-17 MED ORDER — FLUTICASONE PROPIONATE 50 MCG/ACT NA SUSP
1.0000 | Freq: Every day | NASAL | Status: DC
  Administered 2019-02-18 – 2019-03-04 (×13): 1 via NASAL
  Filled 2019-02-17 (×2): qty 16

## 2019-02-17 MED ORDER — ACETAMINOPHEN 325 MG PO TABS
650.0000 mg | ORAL_TABLET | Freq: Four times a day (QID) | ORAL | Status: DC | PRN
  Administered 2019-02-17 – 2019-03-03 (×3): 650 mg via ORAL
  Filled 2019-02-17 (×3): qty 2

## 2019-02-17 MED ORDER — ACETAMINOPHEN 650 MG RE SUPP: 650.0000 mg | Freq: Four times a day (QID) | RECTAL | Status: DC | PRN

## 2019-02-17 MED ORDER — ACETAMINOPHEN 325 MG PO TABS
650.0000 mg | ORAL_TABLET | Freq: Once | ORAL | Status: AC
  Administered 2019-02-17: 650 mg via ORAL
  Filled 2019-02-17: qty 2

## 2019-02-17 MED ORDER — SODIUM CHLORIDE 0.9 % IV SOLN
200.0000 mg | Freq: Once | INTRAVENOUS | Status: AC
  Administered 2019-02-17: 200 mg via INTRAVENOUS
  Filled 2019-02-17: qty 40

## 2019-02-17 MED ORDER — ALBUTEROL SULFATE HFA 108 (90 BASE) MCG/ACT IN AERS
1.0000 | INHALATION_SPRAY | Freq: Four times a day (QID) | RESPIRATORY_TRACT | Status: DC | PRN
  Administered 2019-02-17 – 2019-02-23 (×4): 2 via RESPIRATORY_TRACT
  Administered 2019-02-24: 17:00:00 1 via RESPIRATORY_TRACT
  Administered 2019-02-24 – 2019-02-27 (×4): 2 via RESPIRATORY_TRACT
  Filled 2019-02-17: qty 6.7

## 2019-02-17 NOTE — Progress Notes (Signed)
Pharmacy Brief Note:  Pharmacy consulted to dose remdesivir in this COVID+ patient presenting for worsening SOB and hypoxemia.  Assessment: -Diagnosed COVID+ on Tuesday 02/13/19 -Was placed on home oxygen, currently on 2L oxygen per notes -CXR: Worsening bilateral infiltrates -ALT: 42  Plan: -Remdesivir 200 mg IV once followed by 100 mg IV daily x 4 days.  -CMET, CBC ordered x 5 days per MD  Lenis Noon, PharmD 02/17/19 2:03 PM

## 2019-02-17 NOTE — Progress Notes (Signed)
CRITICAL VALUE ALERT  Critical Value:  Lactic Acid 2.1  Date & Time Notied:  02/17/2019 @ 9417  Provider Notified: Tyrell Antonio, MD  Orders Received/Actions taken:  Awaiting orders.

## 2019-02-17 NOTE — ED Notes (Signed)
Report called to Katelyn, RN

## 2019-02-17 NOTE — ED Triage Notes (Signed)
Pt arrives POV c/o increased SHOB, low O2 sats along with previous Dx of COVID-19.

## 2019-02-17 NOTE — H&P (Addendum)
History and Physical  Ronald Arnold PPJ:[id number] DOB: 19-Nov-1955 DOA: 02/17/2019  PCP: Lawerance Cruel, MD Patient coming from: Home  I have personally briefly reviewed patient's old medical records in [place]   Chief Complaint: Worsening shortness of breath and hypoxemia  HPI: Ronald Arnold is a 63 y.o. 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  Patient currently day 10 of symptoms for COVID-19.  He was diagnosed with cough with on Monday (7/27), at [facility] clinic.  He presents complaining of worsening shortness of breath, cough.  His oxygen dropped yesterday.  He was able to be on 3 L of oxygen at home, his PCP for help arrange oxygen at home.  He denies chest pain, abdominal pain, diarrhea.   Evaluation in the ED: Patient temperature 101, oxygen saturation 92 on 2 L.  Sodium 133 chloride 97, BUN 18, creatinine 1.93, calcium 8.0, albumin 3.0, AST 60, LDH 344, ferritin 711, CRP 9.6, procalcitonin 0.3 lactic acid 1.8, white blood cell 7.5, hemoglobin 13, d-dimer 0.9, fibrinogen 626. Chest x-ray:Worsened lung aeration with an increase in bilateral hazy airspace opacities consistent with multifocal pneumonia, pattern consistent with the diagnosis of COVID-19.     Review of Systems: All systems reviewed and apart from history of presenting illness, are negative.  Past Medical History:  Diagnosis Date   Cancer [facility])    prostate cancer    COVID-19 virus infection    Past Surgical History:  Procedure Laterality Date   LYMPHADENECTOMY Bilateral 06/20/2014   Procedure: LYMPHADENECTOMY;  Surgeon: Raynelle Bring, MD;  Location: [facility];  Service: Urology;  Laterality: Bilateral;   ROBOT ASSISTED LAPAROSCOPIC RADICAL PROSTATECTOMY N/A 06/20/2014   Procedure: ROBOTIC ASSISTED LAPAROSCOPIC RADICAL PROSTATECTOMY LEVEL 2;   Surgeon: Raynelle Bring, MD;  Location: [facility];  Service: Urology;  Laterality: N/A;   TONSILLECTOMY     to correct sleep apnea    Social History:  reports that he has never smoked. He has never used smokeless tobacco. He reports current alcohol use. He reports that he does not use drugs.   Allergies  Allergen Reactions   Sulfa Antibiotics Other (See Comments)    As a child patient is unaware of reaction   Family history: Father with history of prostate cancer died at age 10.  Mother with diabetes and hypertension.  Prior to Admission medications   Medication Sig Start Date End Date Taking? Authorizing Provider  Ascorbic Acid (VITAMIN C) 1000 MG tablet Take 1,000 mg by mouth daily.   Yes [provider]  Cholecalciferol (VITAMIN D-3) 25 MCG (1000 UT) CAPS Take 2,000 Units by mouth daily.   Yes [provider]  fexofenadine (ALLEGRA) 180 MG tablet Take 180 mg by mouth daily.   Yes [provider]  fluticasone (FLONASE) 50 MCG/ACT nasal spray Place into both nostrils daily.   Yes [provider]  Multiple Vitamin (MULTIVITAMIN WITH MINERALS) TABS tablet Take 1 tablet by mouth daily.   Yes [provider]  predniSONE (DELTASONE) 10 MG tablet Take 4 tablets (40 mg total) by mouth daily. 02/13/19  Yes Fredia Sorrow, MD  rosuvastatin (CRESTOR) 5 MG tablet Take 5 mg by mouth daily. 12/19/18  Yes [provider]  aspirin EC 81 MG tablet Take 81 mg by mouth daily.    [provider]   Physical Exam: Vitals:  02/17/19 1225 02/17/19 1230 02/17/19 1254 02/17/19 1315  BP:  (!) 150/84    Pulse: (!) 106 98 98 97  Resp:  18 18   Temp:      TempSrc:      SpO2: 91% 91% 91% 92%  Weight:      Height:         General exam: Moderately built and nourished patient, in mild distress  Head, eyes and ENT: Nontraumatic and normocephalic. Pupils equally reacting to light and accommodation. Oral mucosa moist.  Neck: Supple. No JVD, carotid  bruit or thyromegaly.  Lymphatics: No lymphadenopathy.  Respiratory system: Decreased breath sounds, normal respiratory effort, bilateral air movement  Cardiovascular system: S1 and S2 heard, RRR. No JVD, murmurs, gallops, clicks or pedal edema.  Gastrointestinal system: Abdomen is obese distended, soft and nontender. Normal bowel sounds heard. No organomegaly or masses appreciated.  Central nervous system: Alert and oriented. No focal neurological deficits.  Extremities: Symmetric 5 x 5 power. Peripheral pulses symmetrically felt.   Skin: No rashes or acute findings.  Musculoskeletal system: Negative exam.  Psychiatry: Pleasant and cooperative.   Labs on Admission:  Basic Metabolic Panel: Recent Labs  Lab 02/15/19 1357 02/17/19 1112  NA 135 133*  K 4.0 3.6  CL 100 97*  CO2 23 24  GLUCOSE 129* 179*  BUN 20 18  CREATININE 0.98 0.93  CALCIUM 8.0* 8.0*   Liver Function Tests: Recent Labs  Lab 02/15/19 1357 02/17/19 1112  AST 68* 60*  ALT 46* 42  ALKPHOS 43 34*  BILITOT 0.5 0.6  PROT 7.3 6.5  ALBUMIN 3.7 3.0*   No results for input(s): LIPASE, AMYLASE in the last 168 hours. No results for input(s): AMMONIA in the last 168 hours. CBC: Recent Labs  Lab 02/15/19 1141 02/17/19 1112  WBC 7.9 7.5  NEUTROABS 6.4 6.6  HGB 15.2 13.8  HCT 46.6 40.5  MCV 94.0 92.9  PLT 167 160   Cardiac Enzymes: No results for input(s): CKTOTAL, CKMB, CKMBINDEX, TROPONINI in the last 168 hours.  BNP (last 3 results) No results for input(s): PROBNP in the last 8760 hours. CBG: No results for input(s): GLUCAP in the last 168 hours.  Radiological Exams on Admission: Dg Chest Port 1 View  Result Date: 02/17/2019 CLINICAL DATA:  Increased shortness of breath. Previous diagnosis of COVID-19. EXAM: PORTABLE CHEST 1 VIEW COMPARISON:  02/15/2019 and earlier studies. FINDINGS: There are hazy airspace opacities, most evident in the inferior right upper lobe adjacent to the minor fissure,  and left lung base, noted to a lesser degree in the right lung base, increased when compared to the prior radiographs. No convincing pleural effusion.  No pneumothorax. IMPRESSION: 1. Worsened lung aeration with an increase in bilateral hazy airspace opacities consistent with multifocal pneumonia, pattern consistent with the diagnosis of COVID-19. Electronically Signed   By: Lajean Manes M.D.   On: 02/17/2019 12:01    EKG: I sinus on the monitor.  Assessment/Plan Active Problems:   Pneumonia due to COVID-19 virus  1-Acute hypoxic respiratory failure secondary to COVID-19 pneumonia Patient was diagnosed with Covid 51, [facility] clinic. Dr. Tomi Bamberger spoke with patient PCP and confirmed the diagnosis of COVID-19. Patient with hypoxemia requiring 2 L of oxygen, chest x-ray with worsening bilateral infiltrates.  Patient will be admitted to telemetry, Will start Redemsivir, oral Decadron. -Follow CRP, d-dimer and ferritin daily. -If d-dimer more than 5 he will need higher dose of anticoagulation. -If patient become more hypoxic well start Actemra.  This has been discussed with patient.  2-Mild hyponatremia: IV fluids -Check hepatitis panel.  3-Transaminases: Likely related to COVID-19.  4-history of prostate cancer: Status post surgery 5 years ago.    DVT Prophylaxis: Lovenox Code Status: Full code Family Communication: Care discussed with patient who was alert and oriented and able to understand plan of care Disposition Plan: Admit to telemetry for treatment of COVID-19 pneumonia.  we will proceed with treatment Redemsivir  and oral dexamethasone.  Monitor respiratory status very closely.  Time spent: 75 minutes.   Elmarie Shiley MD [facility]   02/17/2019, 1:25 PM

## 2019-02-17 NOTE — ED Provider Notes (Signed)
[facility] DEPT Provider Note   CSN: [id number] Arrival date & time: 02/17/19  1038    History   Chief Complaint Chief Complaint  Patient presents with  . Shortness of Breath    HPI Ronald Arnold is a 63 y.o. male.     HPI Patient presents to the emergency room for progressive shortness of breath associated with known COVID-19 illness.  Patient started having symptoms within the past week.  The symptoms specifically started on Wednesday.  He started having difficulty with fevers up to 102.  He began coughing.  He was not feeling short of breath initially.  He had some mild congestion.  Patient had an outpatient coded test that was positive.  Patient started to feel worse and was in the emergency room on July 30.  At that time the patient's x-ray showed some groundglass opacities consistent with covid infection.  His oxygenation remained in the low 90s on room air.  Patient was given the option for admission but he felt well enough to go home.  Patient states last evening his doctor ordered home oxygen for him as he started to have more shortness of breath.  Today despite being on supplemental oxygen he is continued to have episodes of hypoxia and increasing shortness of breath.  He spoke to his doctor who suggested he come to the hospital to be admitted. Past Medical History:  Diagnosis Date  . Cancer [facility])    prostate cancer   . COVID-19 virus infection     Patient Active Problem List   Diagnosis Date Noted  . Pneumonia due to COVID-19 virus 02/17/2019  . Prostate cancer ([place]) 06/20/2014    Past Surgical History:  Procedure Laterality Date  . LYMPHADENECTOMY Bilateral 06/20/2014   Procedure: LYMPHADENECTOMY;  Surgeon: Raynelle Bring, MD;  Location: [facility];  Service: Urology;  Laterality: Bilateral;  . ROBOT ASSISTED LAPAROSCOPIC RADICAL PROSTATECTOMY N/A 06/20/2014   Procedure: ROBOTIC ASSISTED LAPAROSCOPIC RADICAL PROSTATECTOMY LEVEL 2;  Surgeon:  Raynelle Bring, MD;  Location: [facility];  Service: Urology;  Laterality: N/A;  . TONSILLECTOMY     to correct sleep apnea         Home Medications    Prior to Admission medications   Medication Sig Start Date End Date Taking? Authorizing Provider  Ascorbic Acid (VITAMIN C) 1000 MG tablet Take 1,000 mg by mouth daily.   Yes [provider]  Cholecalciferol (VITAMIN D-3) 25 MCG (1000 UT) CAPS Take 2,000 Units by mouth daily.   Yes [provider]  fexofenadine (ALLEGRA) 180 MG tablet Take 180 mg by mouth daily.   Yes [provider]  fluticasone (FLONASE) 50 MCG/ACT nasal spray Place into both nostrils daily.   Yes [provider]  Multiple Vitamin (MULTIVITAMIN WITH MINERALS) TABS tablet Take 1 tablet by mouth daily.   Yes [provider]  predniSONE (DELTASONE) 10 MG tablet Take 4 tablets (40 mg total) by mouth daily. 02/13/19  Yes Fredia Sorrow, MD  rosuvastatin (CRESTOR) 5 MG tablet Take 5 mg by mouth daily. 12/19/18  Yes [provider]  aspirin EC 81 MG tablet Take 81 mg by mouth daily.    [provider]    Family History No family history on file.  Social History Social History   Tobacco Use  . Smoking status: Never Smoker  . Smokeless tobacco: Never Used  Substance Use Topics  . Alcohol use: Yes    Comment: occasional beer or wine   .  Drug use: No     Allergies   Sulfa antibiotics   Review of Systems Review of Systems  Constitutional: Positive for fever.  Gastrointestinal: Positive for diarrhea. Negative for vomiting.  Neurological: Negative for headaches.  All other systems reviewed and are negative.    Physical Exam Updated Vital Signs BP 140/89 (BP Location: Left Arm)   Pulse 94   Temp 99.2 F (37.3 C) (Oral)   Resp 18   Ht 1.778 m (5\' 10" )   Wt 93 kg   SpO2 91%   BMI 29.42 kg/m   Physical Exam Vitals signs and nursing note reviewed.  Constitutional:      Appearance: He is  well-developed. He is ill-appearing.  HENT:     Head: Normocephalic and atraumatic.     Right Ear: External ear normal.     Left Ear: External ear normal.  Eyes:     General: No scleral icterus.       Right eye: No discharge.        Left eye: No discharge.     Conjunctiva/sclera: Conjunctivae normal.  Neck:     Musculoskeletal: Neck supple.     Trachea: No tracheal deviation.  Cardiovascular:     Rate and Rhythm: Normal rate and regular rhythm.  Pulmonary:     Effort: Pulmonary effort is normal. No respiratory distress.     Breath sounds: Normal breath sounds. No stridor. No wheezing or rales.  Abdominal:     General: Bowel sounds are normal. There is no distension.     Palpations: Abdomen is soft.     Tenderness: There is no abdominal tenderness. There is no guarding or rebound.  Musculoskeletal:        General: No tenderness.  Skin:    General: Skin is warm and dry.     Findings: No rash.  Neurological:     Mental Status: He is alert.     Cranial Nerves: No cranial nerve deficit (no facial droop, extraocular movements intact, no slurred speech).     Sensory: No sensory deficit.     Motor: No abnormal muscle tone or seizure activity.     Coordination: Coordination normal.      ED Treatments / Results  Labs (all labs ordered are listed, but only abnormal results are displayed) Labs Reviewed  COMPREHENSIVE METABOLIC PANEL - Abnormal; Notable for the following components:      Result Value   Sodium 133 (*)    Chloride 97 (*)    Glucose, Bld 179 (*)    Calcium 8.0 (*)    Albumin 3.0 (*)    AST 60 (*)    Alkaline Phosphatase 34 (*)    All other components within normal limits  D-DIMER, QUANTITATIVE (NOT AT [facility]) - Abnormal; Notable for the following components:   D-Dimer, Quant 0.98 (*)    All other components within normal limits  LACTATE DEHYDROGENASE - Abnormal; Notable for the following components:   LDH 344 (*)    All other components within normal limits   FERRITIN - Abnormal; Notable for the following components:   Ferritin 711 (*)    All other components within normal limits  FIBRINOGEN - Abnormal; Notable for the following components:   Fibrinogen 626 (*)    All other components within normal limits  C-REACTIVE PROTEIN - Abnormal; Notable for the following components:   CRP 9.6 (*)    All other components within normal limits  CULTURE, BLOOD (ROUTINE X 2)  CULTURE, BLOOD (  ROUTINE X 2)  LACTIC ACID, PLASMA  CBC WITH DIFFERENTIAL/PLATELET  PROCALCITONIN  TRIGLYCERIDES  LACTIC ACID, PLASMA  HEPATITIS PANEL, ACUTE  HIV ANTIBODY (ROUTINE TESTING W REFLEX)    EKG None  Radiology Dg Chest Port 1 View  Result Date: 02/17/2019 CLINICAL DATA:  Increased shortness of breath. Previous diagnosis of COVID-19. EXAM: PORTABLE CHEST 1 VIEW COMPARISON:  02/15/2019 and earlier studies. FINDINGS: There are hazy airspace opacities, most evident in the inferior right upper lobe adjacent to the minor fissure, and left lung base, noted to a lesser degree in the right lung base, increased when compared to the prior radiographs. No convincing pleural effusion.  No pneumothorax. IMPRESSION: 1. Worsened lung aeration with an increase in bilateral hazy airspace opacities consistent with multifocal pneumonia, pattern consistent with the diagnosis of COVID-19. Electronically Signed   By: Lajean Manes M.D.   On: 02/17/2019 12:01    Procedures .Critical Care Performed by: Dorie Rank, MD Authorized by: Dorie Rank, MD   Critical care provider statement:    Critical care time (minutes):  35   Critical care was time spent personally by me on the following activities:  Discussions with consultants, evaluation of patient's response to treatment, examination of patient, ordering and performing treatments and interventions, ordering and review of laboratory studies, ordering and review of radiographic studies, pulse oximetry, re-evaluation of patient's condition,  obtaining history from patient or surrogate and review of old charts   (including critical care time)  Medications Ordered in ED Medications  dexamethasone (DECADRON) tablet 6 mg (6 mg Oral Given 02/17/19 1337)  albuterol (VENTOLIN HFA) 108 (90 Base) MCG/ACT inhaler 1-2 puff (2 puffs Inhalation Given 02/17/19 1338)  vitamin C (ASCORBIC ACID) tablet 1,000 mg (has no administration in time range)  Vitamin D-3 CAPS 2,000 Units (has no administration in time range)  multivitamin with minerals tablet 1 tablet (1 tablet Oral Given 02/17/19 1337)  loratadine (CLARITIN) tablet 10 mg (10 mg Oral Given 02/17/19 1337)  fluticasone (FLONASE) 50 MCG/ACT nasal spray 1 spray (has no administration in time range)  enoxaparin (LOVENOX) injection 40 mg (has no administration in time range)  0.9 %  sodium chloride infusion ( Intravenous New Bag/Given 02/17/19 1338)  acetaminophen (TYLENOL) tablet 650 mg (has no administration in time range)    Or  acetaminophen (TYLENOL) suppository 650 mg (has no administration in time range)  HYDROcodone-acetaminophen (NORCO/VICODIN) 5-325 MG per tablet 1-2 tablet (has no administration in time range)  senna-docusate (Senokot-S) tablet 1 tablet (has no administration in time range)  ondansetron (ZOFRAN) tablet 4 mg (has no administration in time range)    Or  ondansetron (ZOFRAN) injection 4 mg (has no administration in time range)  hydrALAZINE (APRESOLINE) injection 5 mg (has no administration in time range)  chlorpheniramine-HYDROcodone (TUSSIONEX) 10-8 MG/5ML suspension 5 mL (has no administration in time range)  remdesivir 200 mg in sodium chloride 0.9 % 250 mL IVPB (has no administration in time range)  acetaminophen (TYLENOL) tablet 650 mg (650 mg Oral Given 02/17/19 1254)     Initial Impression / Assessment and Plan / ED Course  I have reviewed the triage vital signs and the nursing notes.  Pertinent labs & imaging results that were available during my care of the patient  were reviewed by me and considered in my medical decision making (see chart for details).  Clinical Course as of Feb 16 1341  Sat Feb 17, 2019  1340 Discussed case with Dr Jacelyn Grip, pt's PCP.  He confirms positive  covid test.  He reviewed his e clinical works and had a positive test on 7/27.  ([facility])   [JK]    Clinical Course User Index [JK] Dorie Rank, MD  Patient presents with worsening shortness of breath associated with known COVID-19 infection.  Chest x-ray unfortunately shows worsening lung aeration.  He remains febrile.  Patient has elevated d-dimer fibrinogen and LDH.  His oxygenation is stable on 2 L nasal cannula.  Plan on admission to the hospital for further treatment of his worsening COVID-19 infection. Final Clinical Impressions(s) / ED Diagnoses   Final diagnoses:  Lower respiratory tract infection due to COVID-19 virus      Dorie Rank, MD 02/17/19 1342

## 2019-02-18 DIAGNOSIS — U071 COVID-19: Principal | ICD-10-CM

## 2019-02-18 DIAGNOSIS — J1289 Other viral pneumonia: Secondary | ICD-10-CM

## 2019-02-18 LAB — FERRITIN: Ferritin: 940 ng/mL — ABNORMAL HIGH (ref 24–336)

## 2019-02-18 LAB — CBC
HCT: 49 % (ref 39.0–52.0)
Hemoglobin: 15.7 g/dL (ref 13.0–17.0)
MCH: 30.5 pg (ref 26.0–34.0)
MCHC: 32 g/dL (ref 30.0–36.0)
MCV: 95.1 fL (ref 80.0–100.0)
Platelets: 208 10*3/uL (ref 150–400)
RBC: 5.15 MIL/uL (ref 4.22–5.81)
RDW: 13 % (ref 11.5–15.5)
WBC: 7.8 10*3/uL (ref 4.0–10.5)
nRBC: 0 % (ref 0.0–0.2)

## 2019-02-18 LAB — COMPREHENSIVE METABOLIC PANEL
ALT: 47 U/L — ABNORMAL HIGH (ref 0–44)
AST: 67 U/L — ABNORMAL HIGH (ref 15–41)
Albumin: 3 g/dL — ABNORMAL LOW (ref 3.5–5.0)
Alkaline Phosphatase: 42 U/L (ref 38–126)
Anion gap: 11 (ref 5–15)
BUN: 18 mg/dL (ref 8–23)
CO2: 24 mmol/L (ref 22–32)
Calcium: 8.1 mg/dL — ABNORMAL LOW (ref 8.9–10.3)
Chloride: 103 mmol/L (ref 98–111)
Creatinine, Ser: 0.85 mg/dL (ref 0.61–1.24)
GFR calc Af Amer: >60 mL/min (ref >60)
GFR calc non Af Amer: >60 mL/min (ref >60)
Glucose, Bld: 133 mg/dL — ABNORMAL HIGH (ref 70–99)
Potassium: 4 mmol/L (ref 3.5–5.1)
Sodium: 138 mmol/L (ref 135–145)
Total Bilirubin: 0.6 mg/dL (ref 0.3–1.2)
Total Protein: 7 g/dL (ref 6.5–8.1)

## 2019-02-18 LAB — D-DIMER, QUANTITATIVE: D-Dimer, Quant: 1.15 ug/mL-FEU — ABNORMAL HIGH (ref 0.00–0.50)

## 2019-02-18 LAB — MRSA PCR SCREENING: MRSA by PCR: NEGATIVE

## 2019-02-18 LAB — HIV ANTIBODY (ROUTINE TESTING W REFLEX): HIV Screen 4th Generation wRfx: NONREACTIVE

## 2019-02-18 LAB — C-REACTIVE PROTEIN: CRP: 14.8 mg/dL — ABNORMAL HIGH (ref <1.0)

## 2019-02-18 MED ORDER — HYDRALAZINE HCL 20 MG/ML IJ SOLN
5.0000 mg | Freq: Four times a day (QID) | INTRAMUSCULAR | Status: DC | PRN
  Administered 2019-02-19 – 2019-02-20 (×3): 5 mg via INTRAVENOUS
  Filled 2019-02-18 (×5): qty 1

## 2019-02-18 MED ORDER — ORAL CARE MOUTH RINSE
15.0000 mL | Freq: Two times a day (BID) | OROMUCOSAL | Status: DC
  Administered 2019-02-19 – 2019-02-21 (×6): 15 mL via OROMUCOSAL

## 2019-02-18 MED ORDER — CHLORHEXIDINE GLUCONATE 0.12 % MT SOLN
15.0000 mL | Freq: Two times a day (BID) | OROMUCOSAL | Status: DC
  Administered 2019-02-17 – 2019-02-22 (×9): 15 mL via OROMUCOSAL
  Filled 2019-02-18 (×8): qty 15

## 2019-02-18 MED ORDER — PANTOPRAZOLE SODIUM 40 MG PO TBEC
40.0000 mg | DELAYED_RELEASE_TABLET | Freq: Two times a day (BID) | ORAL | Status: DC
  Administered 2019-02-18 – 2019-02-26 (×16): 40 mg via ORAL
  Filled 2019-02-18 (×16): qty 1

## 2019-02-18 MED ORDER — BENZONATATE 100 MG PO CAPS
200.0000 mg | ORAL_CAPSULE | Freq: Three times a day (TID) | ORAL | Status: DC | PRN
  Administered 2019-02-18 – 2019-03-03 (×19): 200 mg via ORAL
  Filled 2019-02-18 (×21): qty 2

## 2019-02-18 NOTE — Progress Notes (Signed)
PROGRESS NOTE    [name]  IEP:[id number] DOB: 06-10-1956 DOA: 02/17/2019 PCP: Lawerance Cruel, MD   Brief Narrative:  Ronald Arnold is Ronald Arnold 63 y.o. 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  Patient currently day 10 of symptoms for COVID-19.  He was diagnosed with cough with on Monday (7/27), at [facility] clinic.  He presents complaining of worsening shortness of breath, cough.  His oxygen dropped yesterday.  He was able to be on 3 L of oxygen at home, his PCP for help arrange oxygen at home.  He denies chest pain, abdominal pain, diarrhea.   Evaluation in the ED: Patient temperature 101, oxygen saturation 92 on 2 L.  Sodium 133 chloride 97, BUN 18, creatinine 1.93, calcium 8.0, albumin 3.0, AST 60, LDH 344, ferritin 711, CRP 9.6, procalcitonin 0.3 lactic acid 1.8, white blood cell 7.5, hemoglobin 13, d-dimer 0.9, fibrinogen 626. Chest x-ray:Worsened lung aeration with an increase in bilateral hazy airspace opacities consistent with multifocal pneumonia, pattern consistent with the diagnosis of COVID-19.   Assessment & Plan:   Active Problems:   Pneumonia due to COVID-19 virus  1-Acute hypoxic respiratory failure secondary to COVID-19 pneumonia Patient was diagnosed with Covid 19, [facility] COVID 19 [facility] Initially on 2 L -> he got progressively worse overnight, now requiring 15 L HFNC Continue decadron Continue remdesivir He received actemra on 8/1 PM Prone as tolerated Will c/s pulm given his O2 needs I/O, daily weights Follow daily inflammatory labs Awaiting transfer to [facility], limited staffing today  2-Elevated LFT's:  - mild, continue to monitor - follow acute hepatitis panel - likely 2/2 above  4-history of prostate cancer: Status post surgery 5 years ago.  DVT  prophylaxis: lovenox Code Status: full  Family Communication: extensive discussion with daughter and wife.  Daughter nurse at [facility].  Many appropriate questions.  Discussed our current treatment algorithm and practices here and rapidly changing nature of care.  Discussed critical illness.  They're appropriately worried. Disposition Plan: transfer to [facility] when bed available   Consultants:   PCCM  Procedures:   none  Antimicrobials:  Anti-infectives (From admission, onward)   Start     Dose/Rate Route Frequency Ordered Stop   02/18/19 1800  remdesivir 100 mg in sodium chloride 0.9 % 250 mL IVPB     100 mg 500 mL/hr over 30 Minutes Intravenous Every 24 hours 02/17/19 1355 02/22/19 1759   02/17/19 1530  remdesivir 200 mg in sodium chloride 0.9 % 250 mL IVPB     200 mg 500 mL/hr over 30 Minutes Intravenous Once 02/17/19 1336 02/17/19 1808      Subjective: C/o SOB Asks me to call his daughter Discuss plan to transfer downstairs  Objective: Vitals:   02/18/19 0627 02/18/19 1100 02/18/19 1234 02/18/19 1239  BP:  129/83 (!) 174/98   Pulse: 88 83 100   Resp:  (!) 34 (!) 23   Temp:  97.9 F (36.6 C)  97.9 F (36.6 C)  TempSrc:  Oral  Oral  SpO2: 90% 91% (!) 86%   Weight:      Height:        Intake/Output Summary (Last 24 hours) at 02/18/2019 1303 Last data filed at 02/18/2019 1154 Gross per 24 hour  Intake 98.29 ml  Output 1625 ml  Net -1526.71 ml  Filed Weights   02/17/19 1058 02/17/19 1124  Weight: 93 kg 93 kg    Examination:  General exam: Appears calm, sitting up in chair Respiratory system: tachypneic, increased wob Cardiovascular system: RRR.  Gastrointestinal system: Abdomen is nondistended, soft and nontender. Central nervous system: Alert and oriented. No focal neurological deficits. Extremities: no LEE Skin: No rashes, lesions or ulcers Psychiatry: Judgement and insight appear normal. Mood & affect appropriate.     Data Reviewed: I have personally  reviewed following labs and imaging studies  CBC: Recent Labs  Lab 02/15/19 1141 02/17/19 1112 02/18/19 0502  WBC 7.9 7.5 7.8  NEUTROABS 6.4 6.6  --   HGB 15.2 13.8 15.7  HCT 46.6 40.5 49.0  MCV 94.0 92.9 95.1  PLT 167 160 387   Basic Metabolic Panel: Recent Labs  Lab 02/15/19 1357 02/17/19 1112 02/18/19 0502  NA 135 133* 138  K 4.0 3.6 4.0  CL 100 97* 103  CO2 23 24 24   GLUCOSE 129* 179* 133*  BUN 20 18 18   CREATININE 0.98 0.93 0.85  CALCIUM 8.0* 8.0* 8.1*   GFR: Estimated Creatinine Clearance: 104.6 mL/min (by C-G formula based on SCr of 0.85 mg/dL). Liver Function Tests: Recent Labs  Lab 02/15/19 1357 02/17/19 1112 02/18/19 0502  AST 68* 60* 67*  ALT 46* 42 47*  ALKPHOS 43 34* 42  BILITOT 0.5 0.6 0.6  PROT 7.3 6.5 7.0  ALBUMIN 3.7 3.0* 3.0*   No results for input(s): LIPASE, AMYLASE in the last 168 hours. No results for input(s): AMMONIA in the last 168 hours. Coagulation Profile: No results for input(s): INR, PROTIME in the last 168 hours. Cardiac Enzymes: No results for input(s): CKTOTAL, CKMB, CKMBINDEX, TROPONINI in the last 168 hours. BNP (last 3 results) No results for input(s): PROBNP in the last 8760 hours. HbA1C: No results for input(s): HGBA1C in the last 72 hours. CBG: No results for input(s): GLUCAP in the last 168 hours. Lipid Profile: Recent Labs    02/17/19 1112  TRIG 83   Thyroid Function Tests: No results for input(s): TSH, T4TOTAL, FREET4, T3FREE, THYROIDAB in the last 72 hours. Anemia Panel: Recent Labs    02/17/19 1112 02/18/19 0502  FERRITIN 711* 940*   Sepsis Labs: Recent Labs  Lab 02/17/19 1112 02/17/19 1714  PROCALCITON 0.33  --   LATICACIDVEN 1.8 2.1*    Recent Results (from the past 240 hour(s))  Blood Culture (routine x 2)     Status: None (Preliminary result)   Collection Time: 02/17/19 12:30 PM   Specimen: BLOOD RIGHT HAND  Result Value Ref Range Status   Specimen Description   Final    BLOOD RIGHT  HAND Performed at [facility] [address], [place], [place] [address]    Special Requests   Final    BOTTLES DRAWN AEROBIC AND ANAEROBIC Blood Culture adequate volume Performed at [facility] [address] [place], [place] [address]    Culture   Final    NO GROWTH < 12 HOURS Performed at [facility] [address] [place], [place] [address]    Report Status PENDING  Incomplete  Blood Culture (routine x 2)     Status: None (Preliminary result)   Collection Time: 02/17/19  5:14 PM   Specimen: BLOOD LEFT ARM  Result Value Ref Range Status   Specimen Description   Final    BLOOD LEFT ARM Performed at [facility], [place],  [place] [address]    Special Requests   Final    BOTTLES DRAWN AEROBIC AND ANAEROBIC Blood Culture results may not be optimal due to an excessive volume of blood received in culture bottles Performed at [facility] [address], [place], [place] [address]    Culture   Final    NO GROWTH < 12 HOURS Performed at [facility] [address] [place], [place] [address]    Report Status PENDING  Incomplete         Radiology Studies: Dg Chest Port 1 View  Result Date: 02/17/2019 CLINICAL DATA:  Increased shortness of breath. Previous diagnosis of COVID-19. EXAM: PORTABLE CHEST 1 VIEW COMPARISON:  02/15/2019 and earlier studies. FINDINGS: There are hazy airspace opacities, most evident in the inferior right upper lobe adjacent to the minor fissure, and left lung base, noted to Rachelann Enloe lesser degree in the right lung base, increased when compared to the prior radiographs. No convincing pleural effusion.  No pneumothorax. IMPRESSION: 1. Worsened lung aeration with an increase in bilateral hazy airspace opacities consistent with multifocal pneumonia, pattern consistent with the diagnosis of COVID-19. Electronically Signed   By: Lajean Manes M.D.   On: 02/17/2019 12:01         Scheduled Meds: . chlorhexidine  15 mL Mouth Rinse BID  . cholecalciferol  2,000 Units Oral Daily  . dexamethasone  6 mg Oral Daily  . enoxaparin (LOVENOX) injection  40 mg Subcutaneous Q24H  . fluticasone  1 spray Each Nare Daily  . loratadine  10 mg Oral Daily  . mouth rinse  15 mL Mouth Rinse q12n4p  . multivitamin with minerals  1 tablet Oral Daily  . vitamin C  1,000 mg Oral Daily   Continuous Infusions: . remdesivir 100 mg in NS 250 mL       LOS: 1 day    Time spent: over 30 min 40 min critical care with AHRF 2/2 COVID 19 pneumonia   Fayrene Helper, MD [facility] Pager AMION  If 7PM-7AM, please contact night-coverage [email or web address] Password [email or web address] 02/18/2019, 1:03 PM

## 2019-02-18 NOTE — Progress Notes (Signed)
Pt transferred to room 1233 as ordered. Pt A&O, tachypneic with shallow respirations. O2 Sats 90-91% 15L/HFNC. Pt able to have short conversations before getting SOB, no signs of distress. This RN spoke to pt's daughter prior to transfer. All belongings sent with patient.

## 2019-02-18 NOTE — Consult Note (Addendum)
PULMONARY / CRITICAL CARE MEDICINE   NAME:  Ronald Arnold, MRN:  [id number], DOB:  [birth year], LOS: 1 ADMISSION DATE:  02/17/2019, CONSULTATION DATE:  02/18/19 REFERRING MD:  [facility]ell/ [facility], CHIEF COMPLAINT:  Hypoxemia in COVID-19 pt   BRIEF HISTORY:    25 yowm never smoker with remote h/o prostate ca and dvt 2015 admitted on day 10 of covid infection with worsening sob and gradually needing higher flow 02 since then despite rx with dex/remdesivir so PCCM consulted pm 8/2 for ideas to help him oxygenate better - note having intermittend hemoptysis also on lovenox since admit.    HISTORY OF PRESENT ILLNESS   Pt in his usual state of health until 10 d PTA with onset of cough/ sob dx as Pos covid 7/27 and placed on outpt 02 to 3lpm by pcp with persistent fevers as high as 103 since admit despte initiating dex/revdesimir per standards for covid in hosp pts. Also developed low grade hemoptysis worse p started lovenox.  No cp / nosebleeds / abd pain n or v or diarrhea    SIGNIFICANT PAST MEDICAL HISTORY   Prostate ca > prostatectomy 2015  DVT 2015   SIGNIFICANT EVENTS:  Tocilizumab 8/1  Dex 6 mg q d  8/1 >>> Remdesivir   8/1 >>>  STUDIES:    LDH  344 8/1  PCT  0.33   8/1  d dimer 1.15  8/2 CRP  14.8  8/2   Micro/CULTURES:  BC x 2 8/1 >>> MRSA screen 8/2  ANTIBIOTICS:     LINES/TUBES:    CONSULTANTS:  PCCM   SUBJECTIVE:  Surprisingly comfortable x for coughing fits > traces of BRB  CONSTITUTIONAL: BP (!) 148/82   Pulse 94   Temp 97.9 F (36.6 C) (Oral)   Resp (!) 37   Ht 5\' 10"  (1.778 m)   Wt 93 kg   SpO2 90%   BMI 29.42 kg/m   I/O last 3 completed shifts: In: 98.3 [IV Piggyback:98.3] Out: 1175 [Urine:1175]        PHYSICAL EXAM: General:  Calm, nad unless coughing / 45 degree hob Neuro:  Intact/ approp Cardiovascular:  RRR no s3 Lungs:  Mild increaesed wob, a few insp/exp rhonchi bilaterally3 Abdomen:  Soft/ benign  Musculoskeletal:  Warm  Skin:  No  breakdown per nursing     I personally reviewed images and agree with radiology impression as follows:  CXR:   8/2  1. Worsened lung aeration with an increase in bilateral hazy airspace opacities consistent with multifocal pneumonia, pattern consistent with the diagnosis of COVID-19.  RESOLVED PROBLEM LIST   ASSESSMENT AND PLAN   1) Acute hypoxemic RF secondary to covid 19 with only mild increased wob (lungs still relatively elastic at this phase not typical of other forms of ALI - not responding yet to standard rx for covid  - cough / hemoptysis problematic given he may develop some component of alv hem yet needs the lovenox to reduce clot risk  >>> since we have a relatively low baseline d dimer rec PAS and hold one dose of lovenox and repeat ddimer in am  >>> add gerd rx since coughing   In terms of supplemental 02 rec try to get him as prone as he'll tolerate and reserve intubation for AMS / paradoxical breathing as the track record is so poor in pts requiring ET in this setting.      SUMMARY OF TODAY'S PLAN:  Hold lovenox x one dose/ recheck ddimer in  am/ prone pt if possible      LABS  Glucose No results for input(s): GLUCAP in the last 168 hours.  BMET Recent Labs  Lab 02/15/19 1357 02/17/19 1112 02/18/19 0502  NA 135 133* 138  K 4.0 3.6 4.0  CL 100 97* 103  CO2 23 24 24   BUN 20 18 18   CREATININE 0.98 0.93 0.85  GLUCOSE 129* 179* 133*    Liver Enzymes Recent Labs  Lab 02/15/19 1357 02/17/19 1112 02/18/19 0502  AST 68* 60* 67*  ALT 46* 42 47*  ALKPHOS 43 34* 42  BILITOT 0.5 0.6 0.6  ALBUMIN 3.7 3.0* 3.0*    Electrolytes Recent Labs  Lab 02/15/19 1357 02/17/19 1112 02/18/19 0502  CALCIUM 8.0* 8.0* 8.1*    CBC Recent Labs  Lab 02/15/19 1141 02/17/19 1112 02/18/19 0502  WBC 7.9 7.5 7.8  HGB 15.2 13.8 15.7  HCT 46.6 40.5 49.0  PLT 167 160 208    ABG Recent Labs  Lab 02/17/19 2340  PHART 7.467*  PCO2ART 34.8  PO2ART 78.8*     Coag's No results for input(s): APTT, INR in the last 168 hours.  Sepsis Markers Recent Labs  Lab 02/17/19 1112 02/17/19 1714  LATICACIDVEN 1.8 2.1*  PROCALCITON 0.33  --     Cardiac Enzymes No results for input(s): TROPONINI, PROBNP in the last 168 hours.  PAST MEDICAL HISTORY :   He  has a past medical history of Cancer ([place]) and COVID-19 virus infection.  PAST SURGICAL HISTORY:  He  has a past surgical history that includes Tonsillectomy; Robot assisted laparoscopic radical prostatectomy (N/A, 06/20/2014); and Lymphadenectomy (Bilateral, 06/20/2014).  Allergies  Allergen Reactions  . Sulfa Antibiotics Other (See Comments)    As a child patient is unaware of reaction    No current facility-administered medications on file prior to encounter.    Current Outpatient Medications on File Prior to Encounter  Medication Sig  . Ascorbic Acid (VITAMIN C) 1000 MG tablet Take 1,000 mg by mouth daily.  . Cholecalciferol (VITAMIN D-3) 25 MCG (1000 UT) CAPS Take 2,000 Units by mouth daily.  . fexofenadine (ALLEGRA) 180 MG tablet Take 180 mg by mouth daily.  . fluticasone (FLONASE) 50 MCG/ACT nasal spray Place into both nostrils daily.  . Multiple Vitamin (MULTIVITAMIN WITH MINERALS) TABS tablet Take 1 tablet by mouth daily.  . predniSONE (DELTASONE) 10 MG tablet Take 4 tablets (40 mg total) by mouth daily.  . rosuvastatin (CRESTOR) 5 MG tablet Take 5 mg by mouth daily.  Marland Kitchen aspirin EC 81 MG tablet Take 81 mg by mouth daily.    FAMILY HISTORY:   His family history is not on file.  SOCIAL HISTORY:  He  reports that he has never smoked. He has never used smokeless tobacco. He reports current alcohol use. He reports that he does not use drugs.    Christinia Gully, MD [facility] [place] [phone] After 5:30 PM or weekends, use Beeper [phone]

## 2019-02-18 NOTE — Progress Notes (Signed)
Patient placed on HHFNC at 35L and 100% w/ O2 sats 87-90%. Patient not in distress at this time, but flow increased to 45 L while eating. RT will continue to monitor patient.

## 2019-02-18 NOTE — Progress Notes (Signed)
Patient's blood pressure was elevated 172/108.  Dr.Powell notified new orders received.  Will continue to monitor and give prn hydralazine as ordered no additional dose needed at this time current BP 153/87

## 2019-02-18 NOTE — Progress Notes (Signed)
Patient currently on salter high flow at 15L w/ O2 sats 89-90%. Heated high flow order placed if needed. Attempting to prone patient as much as tolerated. Patient currently on right side and tolerating well.

## 2019-02-19 ENCOUNTER — Telehealth: Payer: BC Managed Care – PPO | Admitting: Registered"

## 2019-02-19 DIAGNOSIS — J9601 Acute respiratory failure with hypoxia: Secondary | ICD-10-CM

## 2019-02-19 DIAGNOSIS — J22 Unspecified acute lower respiratory infection: Secondary | ICD-10-CM

## 2019-02-19 LAB — CBC WITH DIFFERENTIAL/PLATELET
Abs Immature Granulocytes: 0.03 10*3/uL (ref 0.00–0.07)
Basophils Absolute: 0 10*3/uL (ref 0.0–0.1)
Basophils Relative: 0 %
Eosinophils Absolute: 0 10*3/uL (ref 0.0–0.5)
Eosinophils Relative: 0 %
HCT: 48.6 % (ref 39.0–52.0)
Hemoglobin: 15.9 g/dL (ref 13.0–17.0)
Immature Granulocytes: 0 %
Lymphocytes Relative: 7 %
Lymphs Abs: 0.5 10*3/uL — ABNORMAL LOW (ref 0.7–4.0)
MCH: 30.6 pg (ref 26.0–34.0)
MCHC: 32.7 g/dL (ref 30.0–36.0)
MCV: 93.6 fL (ref 80.0–100.0)
Monocytes Absolute: 0.2 10*3/uL (ref 0.1–1.0)
Monocytes Relative: 2 %
Neutro Abs: 6.4 10*3/uL (ref 1.7–7.7)
Neutrophils Relative %: 91 %
Platelets: 258 10*3/uL (ref 150–400)
RBC: 5.19 MIL/uL (ref 4.22–5.81)
RDW: 13 % (ref 11.5–15.5)
WBC: 7.1 10*3/uL (ref 4.0–10.5)
nRBC: 0 % (ref 0.0–0.2)

## 2019-02-19 LAB — COMPREHENSIVE METABOLIC PANEL
ALT: 51 U/L — ABNORMAL HIGH (ref 0–44)
AST: 68 U/L — ABNORMAL HIGH (ref 15–41)
Albumin: 3 g/dL — ABNORMAL LOW (ref 3.5–5.0)
Alkaline Phosphatase: 45 U/L (ref 38–126)
Anion gap: 13 (ref 5–15)
BUN: 23 mg/dL (ref 8–23)
CO2: 26 mmol/L (ref 22–32)
Calcium: 8.2 mg/dL — ABNORMAL LOW (ref 8.9–10.3)
Chloride: 102 mmol/L (ref 98–111)
Creatinine, Ser: 0.85 mg/dL (ref 0.61–1.24)
GFR calc Af Amer: >60 mL/min (ref >60)
GFR calc non Af Amer: >60 mL/min (ref >60)
Glucose, Bld: 137 mg/dL — ABNORMAL HIGH (ref 70–99)
Potassium: 4.2 mmol/L (ref 3.5–5.1)
Sodium: 141 mmol/L (ref 135–145)
Total Bilirubin: 0.4 mg/dL (ref 0.3–1.2)
Total Protein: 6.8 g/dL (ref 6.5–8.1)

## 2019-02-19 LAB — FERRITIN: Ferritin: 955 ng/mL — ABNORMAL HIGH (ref 24–336)

## 2019-02-19 LAB — D-DIMER, QUANTITATIVE: D-Dimer, Quant: 2.11 ug/mL-FEU — ABNORMAL HIGH (ref 0.00–0.50)

## 2019-02-19 LAB — MAGNESIUM: Magnesium: 2.1 mg/dL (ref 1.7–2.4)

## 2019-02-19 LAB — TRIGLYCERIDES: Triglycerides: 104 mg/dL (ref <150)

## 2019-02-19 LAB — C-REACTIVE PROTEIN: CRP: 9.8 mg/dL — ABNORMAL HIGH (ref <1.0)

## 2019-02-19 MED ORDER — CHLORHEXIDINE GLUCONATE CLOTH 2 % EX PADS
6.0000 | MEDICATED_PAD | Freq: Every day | CUTANEOUS | Status: DC
  Administered 2019-02-20 – 2019-03-01 (×7): 6 via TOPICAL

## 2019-02-19 MED ORDER — ENOXAPARIN SODIUM 40 MG/0.4ML ~~LOC~~ SOLN
40.0000 mg | Freq: Every day | SUBCUTANEOUS | Status: DC
  Administered 2019-02-19: 40 mg via SUBCUTANEOUS
  Filled 2019-02-19: qty 0.4

## 2019-02-19 NOTE — Plan of Care (Signed)
  Problem: Nutrition: Goal: Adequate nutrition will be maintained Outcome: Progressing   Problem: Pain Managment: Goal: General experience of comfort will improve Outcome: Progressing   Problem: Safety: Goal: Ability to remain free from injury will improve Outcome: Progressing   

## 2019-02-19 NOTE — Progress Notes (Signed)
PROGRESS NOTE    Jari Dipasquale Popiel  FXT:[id number] DOB: February 01, 1956 DOA: 02/17/2019 PCP: Lawerance Cruel, MD   Brief Narrative:  Ronald Arnold is Ronald Arnold 63 y.o. 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  Patient currently day 10 of symptoms for COVID-19.  He was diagnosed with cough with on Monday (7/27), at [facility] clinic.  He presents complaining of worsening shortness of breath, cough.  His oxygen dropped yesterday.  He was able to be on 3 L of oxygen at home, his PCP for help arrange oxygen at home.  He denies chest pain, abdominal pain, diarrhea.   Evaluation in the ED: Patient temperature 101, oxygen saturation 92 on 2 L.  Sodium 133 chloride 97, BUN 18, creatinine 1.93, calcium 8.0, albumin 3.0, AST 60, LDH 344, ferritin 711, CRP 9.6, procalcitonin 0.3 lactic acid 1.8, white blood cell 7.5, hemoglobin 13, d-dimer 0.9, fibrinogen 626. Chest x-ray:Worsened lung aeration with an increase in bilateral hazy airspace opacities consistent with multifocal pneumonia, pattern consistent with the diagnosis of COVID-19.   Assessment & Plan:   Active Problems:   Pneumonia due to COVID-19 virus  1-Acute hypoxic respiratory failure secondary to COVID-19 pneumonia Patient was diagnosed with Covid 71, [facility] Currently requiring heated high flow at 40 L/min, but sats ~95, appears stable while proned on high flow Continue decadron Continue remdesivir He received actemra on 8/1 PM Prone as tolerated Resume lovenox today -> if tolerated without hemoptysis, will increase to high dose Appreciate pulmonology consultation I/O, daily weights Follow daily inflammatory labs Awaiting transfer to [facility], some difficulty due to High Flow  # Hemoptysis: he reports this is improved this morning.   Will plan evening dose lovenox tonight.  Per PCCM, if tolerated without hemoptysis, increase to high dose  2-Elevated LFT's:  - mild, continue to monitor - follow acute hepatitis panel - likely 2/2 above  4-history of prostate cancer: Status post surgery 5 years ago.  DVT prophylaxis: lovenox Code Status: full  Family Communication: will call daughter/wife today Disposition Plan: transfer to [facility] when bed available - maybe difficult due to high O2 needs   Consultants:   PCCM  Procedures:   none  Antimicrobials:  Anti-infectives (From admission, onward)   Start     Dose/Rate Route Frequency Ordered Stop   02/18/19 1800  remdesivir 100 mg in sodium chloride 0.9 % 250 mL IVPB     100 mg 500 mL/hr over 30 Minutes Intravenous Every 24 hours 02/17/19 1355 02/22/19 1759   02/17/19 1530  remdesivir 200 mg in sodium chloride 0.9 % 250 mL IVPB     200 mg 500 mL/hr over 30 Minutes Intravenous Once 02/17/19 1336 02/17/19 1808      Subjective: SOB not that bad Proned position is uncomfortable  Objective: Vitals:   02/19/19 0300 02/19/19 0400 02/19/19 0500 02/19/19 0859  BP: (!) 165/91 (!) 133/97    Pulse: 85 94    Resp: (!) 37 (!) 42    Temp:  97.6 F (36.4 C)    TempSrc:  Oral    SpO2: (!) 88% 91%  (!) 88%  Weight:   97.5 kg   Height:        Intake/Output Summary (Last 24 hours) at 02/19/2019 0950 Last data filed at 02/19/2019 0044 Gross per 24 hour  Intake 490 ml  Output 1750 ml  Net -1260 ml   Filed Weights   02/17/19 1058 02/17/19 1124 02/19/19 0500  Weight: 93 kg 93 kg 97.5 kg    Examination:  General: No acute distress. Cardiovascular: RRR Lungs: proned on HFNC, appears comfortable Abdomen: proned, not able to examine Neurological: Alert and oriented 3. Moves all extremities 4. Cranial nerves II through XII grossly intact. Skin: Warm and clamy. No rashes or lesions. Extremities: No clubbing or cyanosis. No edema.   Data Reviewed: I have personally  reviewed following labs and imaging studies  CBC: Recent Labs  Lab 02/15/19 1141 02/17/19 1112 02/18/19 0502 02/19/19 0151  WBC 7.9 7.5 7.8 7.1  NEUTROABS 6.4 6.6  --  6.4  HGB 15.2 13.8 15.7 15.9  HCT 46.6 40.5 49.0 48.6  MCV 94.0 92.9 95.1 93.6  PLT 167 160 208 161   Basic Metabolic Panel: Recent Labs  Lab 02/15/19 1357 02/17/19 1112 02/18/19 0502 02/19/19 0151  NA 135 133* 138 141  K 4.0 3.6 4.0 4.2  CL 100 97* 103 102  CO2 23 24 24 26   GLUCOSE 129* 179* 133* 137*  BUN 20 18 18 23   CREATININE 0.98 0.93 0.85 0.85  CALCIUM 8.0* 8.0* 8.1* 8.2*  MG  --   --   --  2.1   GFR: Estimated Creatinine Clearance: 106.9 mL/min (by C-G formula based on SCr of 0.85 mg/dL). Liver Function Tests: Recent Labs  Lab 02/15/19 1357 02/17/19 1112 02/18/19 0502 02/19/19 0151  AST 68* 60* 67* 68*  ALT 46* 42 47* 51*  ALKPHOS 43 34* 42 45  BILITOT 0.5 0.6 0.6 0.4  PROT 7.3 6.5 7.0 6.8  ALBUMIN 3.7 3.0* 3.0* 3.0*   No results for input(s): LIPASE, AMYLASE in the last 168 hours. No results for input(s): AMMONIA in the last 168 hours. Coagulation Profile: No results for input(s): INR, PROTIME in the last 168 hours. Cardiac Enzymes: No results for input(s): CKTOTAL, CKMB, CKMBINDEX, TROPONINI in the last 168 hours. BNP (last 3 results) No results for input(s): PROBNP in the last 8760 hours. HbA1C: No results for input(s): HGBA1C in the last 72 hours. CBG: No results for input(s): GLUCAP in the last 168 hours. Lipid Profile: Recent Labs    02/17/19 1112 02/19/19 0151  TRIG 83 104   Thyroid Function Tests: No results for input(s): TSH, T4TOTAL, FREET4, T3FREE, THYROIDAB in the last 72 hours. Anemia Panel: Recent Labs    02/18/19 0502 02/19/19 0151  FERRITIN 940* 955*   Sepsis Labs: Recent Labs  Lab 02/17/19 1112 02/17/19 1714  PROCALCITON 0.33  --   LATICACIDVEN 1.8 2.1*    Recent Results (from the past 240 hour(s))  Blood Culture (routine x 2)     Status:  None (Preliminary result)   Collection Time: 02/17/19 12:30 PM   Specimen: BLOOD RIGHT HAND  Result Value Ref Range Status   Specimen Description   Final    BLOOD RIGHT HAND Performed at [facility], [place] [address] [place], [place] [address]    Special Requests   Final    BOTTLES DRAWN AEROBIC AND ANAEROBIC Blood Culture adequate volume Performed at [facility] [address], [place], [place] [address]    Culture   Final    NO GROWTH < 12 HOURS Performed at [place] [address], [place],  [address]    Report Status PENDING  Incomplete  Blood Culture (routine x 2)  Status: None (Preliminary result)   Collection Time: 02/17/19  5:14 PM   Specimen: BLOOD LEFT ARM  Result Value Ref Range Status   Specimen Description   Final    BLOOD LEFT ARM Performed at [facility], [address] [place], [place] [address]    Special Requests   Final    BOTTLES DRAWN AEROBIC AND ANAEROBIC Blood Culture results may not be optimal due to an excessive volume of blood received in culture bottles Performed at [facility] [address] [place], [place] [address]    Culture   Final    NO GROWTH < 12 HOURS Performed at [place] [address] [place], [place] [address]    Report Status PENDING  Incomplete  MRSA PCR Screening     Status: None   Collection Time: 02/18/19  1:05 PM   Specimen: Nasal Mucosa; Nasopharyngeal  Result Value Ref Range Status   MRSA by PCR NEGATIVE NEGATIVE Final    Comment:        The GeneXpert MRSA Assay (FDA approved for NASAL specimens only), is one component of Deloria Brassfield comprehensive MRSA colonization surveillance program. It is not intended to diagnose MRSA infection nor to guide or monitor treatment for MRSA infections. Performed at [facility], [place] [address], [place], [place] [address]          Radiology Studies: Dg Chest Port 1  View  Result Date: 02/17/2019 CLINICAL DATA:  Increased shortness of breath. Previous diagnosis of COVID-19. EXAM: PORTABLE CHEST 1 VIEW COMPARISON:  02/15/2019 and earlier studies. FINDINGS: There are hazy airspace opacities, most evident in the inferior right upper lobe adjacent to the minor fissure, and left lung base, noted to Hazleigh Mccleave lesser degree in the right lung base, increased when compared to the prior radiographs. No convincing pleural effusion.  No pneumothorax. IMPRESSION: 1. Worsened lung aeration with an increase in bilateral hazy airspace opacities consistent with multifocal pneumonia, pattern consistent with the diagnosis of COVID-19. Electronically Signed   By: Lajean Manes M.D.   On: 02/17/2019 12:01        Scheduled Meds: . chlorhexidine  15 mL Mouth Rinse BID  . Chlorhexidine Gluconate Cloth  6 each Topical Q0600  . cholecalciferol  2,000 Units Oral Daily  . dexamethasone  6 mg Oral Daily  . fluticasone  1 spray Each Nare Daily  . loratadine  10 mg Oral Daily  . mouth rinse  15 mL Mouth Rinse q12n4p  . multivitamin with minerals  1 tablet Oral Daily  . pantoprazole  40 mg Oral BID AC  . vitamin C  1,000 mg Oral Daily   Continuous Infusions: . remdesivir 100 mg in NS 250 mL Stopped (02/18/19 1825)     LOS: 2 days    Time spent: over 30 min 35 min critical care with worsening AHRF 2/2 COVID 19 pneumonia   Fayrene Helper, MD [facility] Pager AMION  If 7PM-7AM, please contact night-coverage [email or web address] Password [email or web address] 02/19/2019, 9:50 AM

## 2019-02-19 NOTE — Progress Notes (Signed)
PULMONARY / CRITICAL CARE MEDICINE   NAME:  Ronald Arnold, MRN:  [id number], DOB:  04-01-1956, LOS: 2 ADMISSION DATE:  02/17/2019, CONSULTATION DATE:  02/18/19 REFERRING MD:  [facility]/ [facility], CHIEF COMPLAINT:  Hypoxemia in COVID-19 pt   BRIEF HISTORY:    3 yowm never smoker with remote h/o prostate ca and dvt 2015 admitted on day 10 of covid infection with worsening sob and gradually needing higher flow 02 since then despite rx with dex/remdesivir so PCCM consulted pm 8/2 for ideas to help him oxygenate better - note having intermittend hemoptysis also on lovenox since admit.    SIGNIFICANT PAST MEDICAL HISTORY   Prostate ca > prostatectomy 2015  DVT 2015   SIGNIFICANT EVENTS:  Tocilizumab 8/1  Dex 6 mg q d  8/1 >>>   STUDIES:    LDH  344 8/1  PCT  0.33   8/1  d dimer 1.15  8/2 CRP  14.8  8/2   Micro/CULTURES:  BC x 2 8/1 >> ng  MRSA screen 8/2 neg  ANTIBIOTICS:   Remdesivir   8/1 >>>  LINES/TUBES:    CONSULTANTS:  PCCM   SUBJECTIVE:   Switched to heated high flow yesterday Oxygenating okay on 40 L, 100% Able to lie prone for 6 hours overnight    CONSTITUTIONAL: BP (!) 133/97   Pulse 94   Temp 97.6 F (36.4 C) (Oral)   Resp (!) 42   Ht 5\' 10"  (1.778 m)   Wt 97.5 kg   SpO2 (!) 88%   BMI 30.84 kg/m   I/O last 3 completed shifts: In: 588.3 [P.O.:240; IV Piggyback:348.3] Out: 2925 [Urine:2925]     FiO2 (%):  [100 %] 100 %  PHYSICAL EXAM: Limited tele exam No distress, able to lie prone, respirations ranging from 22-36, mild accessory muscle use   Chest x-ray from 8/1 personally reviewed which shows worsening aeration left lower lobe and right upper lobe  RESOLVED PROBLEM LIST   ASSESSMENT AND PLAN   1) Acute hypoxemic respiratory failure secondary to covid 19 -On dexamethasone remdesevir -Received Actemra 8/1 -Continue heated high flow -Continue pronating as much as he tolerates 6 to 8 hours at a time -[employer] transfer may be delayed due to  difficult transport due to severe hypoxia  2) cough / hemoptysis -Lovenox held 8/2 Will restart at lower dose 8/3 and if tolerated without hemoptysis increased to high dose  3) very mildly elevated LFTs-monitor while on remdesevir  PCCM to follow along with you, hypoxia worsened since admission but appears to have stabilized now  Kara Mead MD. FCCP. [facility] Pager [phone] If no response call [phone]   02/19/2019

## 2019-02-20 ENCOUNTER — Inpatient Hospital Stay (HOSPITAL_COMMUNITY): Payer: BC Managed Care – PPO

## 2019-02-20 LAB — CBC WITH DIFFERENTIAL/PLATELET
Abs Immature Granulocytes: 0.08 10*3/uL — ABNORMAL HIGH (ref 0.00–0.07)
Basophils Absolute: 0 10*3/uL (ref 0.0–0.1)
Basophils Relative: 0 %
Eosinophils Absolute: 0 10*3/uL (ref 0.0–0.5)
Eosinophils Relative: 0 %
HCT: 45.5 % (ref 39.0–52.0)
Hemoglobin: 15.1 g/dL (ref 13.0–17.0)
Immature Granulocytes: 1 %
Lymphocytes Relative: 9 %
Lymphs Abs: 0.7 10*3/uL (ref 0.7–4.0)
MCH: 31 pg (ref 26.0–34.0)
MCHC: 33.2 g/dL (ref 30.0–36.0)
MCV: 93.4 fL (ref 80.0–100.0)
Monocytes Absolute: 0.3 10*3/uL (ref 0.1–1.0)
Monocytes Relative: 4 %
Neutro Abs: 6 10*3/uL (ref 1.7–7.7)
Neutrophils Relative %: 86 %
Platelets: 239 10*3/uL (ref 150–400)
RBC: 4.87 MIL/uL (ref 4.22–5.81)
RDW: 12.9 % (ref 11.5–15.5)
WBC: 7 10*3/uL (ref 4.0–10.5)
nRBC: 0 % (ref 0.0–0.2)

## 2019-02-20 LAB — D-DIMER, QUANTITATIVE: D-Dimer, Quant: 10.69 ug/mL-FEU — ABNORMAL HIGH (ref 0.00–0.50)

## 2019-02-20 LAB — COMPREHENSIVE METABOLIC PANEL
ALT: 60 U/L — ABNORMAL HIGH (ref 0–44)
AST: 68 U/L — ABNORMAL HIGH (ref 15–41)
Albumin: 2.8 g/dL — ABNORMAL LOW (ref 3.5–5.0)
Alkaline Phosphatase: 44 U/L (ref 38–126)
Anion gap: 11 (ref 5–15)
BUN: 27 mg/dL — ABNORMAL HIGH (ref 8–23)
CO2: 26 mmol/L (ref 22–32)
Calcium: 8.1 mg/dL — ABNORMAL LOW (ref 8.9–10.3)
Chloride: 101 mmol/L (ref 98–111)
Creatinine, Ser: 0.86 mg/dL (ref 0.61–1.24)
GFR calc Af Amer: >60 mL/min (ref >60)
GFR calc non Af Amer: >60 mL/min (ref >60)
Glucose, Bld: 150 mg/dL — ABNORMAL HIGH (ref 70–99)
Potassium: 4 mmol/L (ref 3.5–5.1)
Sodium: 138 mmol/L (ref 135–145)
Total Bilirubin: 0.7 mg/dL (ref 0.3–1.2)
Total Protein: 6.3 g/dL — ABNORMAL LOW (ref 6.5–8.1)

## 2019-02-20 LAB — HEMOGLOBIN A1C
Hgb A1c MFr Bld: 6.2 % — ABNORMAL HIGH (ref 4.8–5.6)
Mean Plasma Glucose: 131.24 mg/dL

## 2019-02-20 LAB — MAGNESIUM: Magnesium: 2 mg/dL (ref 1.7–2.4)

## 2019-02-20 LAB — C-REACTIVE PROTEIN: CRP: 4.4 mg/dL — ABNORMAL HIGH (ref <1.0)

## 2019-02-20 LAB — HEPATITIS PANEL, ACUTE
HCV Ab: <0.1 s/co ratio (ref 0.0–0.9)
Hep A IgM: NEGATIVE
Hep B C IgM: NEGATIVE
Hepatitis B Surface Ag: NEGATIVE

## 2019-02-20 LAB — GLUCOSE, CAPILLARY
Glucose-Capillary: 136 mg/dL — ABNORMAL HIGH (ref 70–99)
Glucose-Capillary: 164 mg/dL — ABNORMAL HIGH (ref 70–99)
Glucose-Capillary: 210 mg/dL — ABNORMAL HIGH (ref 70–99)

## 2019-02-20 LAB — FERRITIN: Ferritin: 775 ng/mL — ABNORMAL HIGH (ref 24–336)

## 2019-02-20 LAB — TRIGLYCERIDES: Triglycerides: 113 mg/dL (ref <150)

## 2019-02-20 MED ORDER — BOOST / RESOURCE BREEZE PO LIQD CUSTOM
1.0000 | Freq: Three times a day (TID) | ORAL | Status: DC
  Administered 2019-02-21 – 2019-03-04 (×33): 1 via ORAL
  Filled 2019-02-20 (×38): qty 1

## 2019-02-20 MED ORDER — HYDROCHLOROTHIAZIDE 12.5 MG PO CAPS
12.5000 mg | ORAL_CAPSULE | Freq: Every day | ORAL | Status: DC
  Administered 2019-02-20 – 2019-02-24 (×5): 12.5 mg via ORAL
  Filled 2019-02-20 (×6): qty 1

## 2019-02-20 MED ORDER — INSULIN ASPART 100 UNIT/ML ~~LOC~~ SOLN
0.0000 [IU] | Freq: Three times a day (TID) | SUBCUTANEOUS | Status: DC
  Administered 2019-02-20: 2 [IU] via SUBCUTANEOUS
  Administered 2019-02-20: 1 [IU] via SUBCUTANEOUS
  Administered 2019-02-21: 5 [IU] via SUBCUTANEOUS
  Administered 2019-02-22: 2 [IU] via SUBCUTANEOUS
  Administered 2019-02-22: 13:00:00 5 [IU] via SUBCUTANEOUS

## 2019-02-20 MED ORDER — LABETALOL HCL 5 MG/ML IV SOLN
10.0000 mg | INTRAVENOUS | Status: DC | PRN
  Administered 2019-02-20: 10 mg via INTRAVENOUS
  Filled 2019-02-20: qty 4

## 2019-02-20 MED ORDER — AMLODIPINE BESYLATE 5 MG PO TABS
5.0000 mg | ORAL_TABLET | Freq: Every day | ORAL | Status: DC
  Administered 2019-02-20 – 2019-02-24 (×5): 5 mg via ORAL
  Filled 2019-02-20 (×5): qty 1

## 2019-02-20 MED ORDER — TOCILIZUMAB 400 MG/20ML IV SOLN
8.0000 mg/kg | Freq: Once | INTRAVENOUS | Status: AC
  Administered 2019-02-20: 780 mg via INTRAVENOUS
  Filled 2019-02-20: qty 39

## 2019-02-20 MED ORDER — ENOXAPARIN SODIUM 40 MG/0.4ML ~~LOC~~ SOLN
40.0000 mg | Freq: Two times a day (BID) | SUBCUTANEOUS | Status: DC
  Administered 2019-02-20 – 2019-03-04 (×25): 40 mg via SUBCUTANEOUS
  Filled 2019-02-20 (×25): qty 0.4

## 2019-02-20 NOTE — Progress Notes (Addendum)
PROGRESS NOTE    Ronald Arnold  EZM:[id number] DOB: 1956-03-13 DOA: 02/17/2019 PCP: Lawerance Cruel, MD   Brief Narrative:  Ronald Arnold is Militza Arnold 63 y.o. 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  Patient currently day 10 of symptoms for COVID-19.  He was diagnosed with cough with on Monday (7/27), at [facility] clinic.  He presents complaining of worsening shortness of breath, cough.  His oxygen dropped yesterday.  He was able to be on 3 L of oxygen at home, his PCP for help arrange oxygen at home.  He denies chest pain, abdominal pain, diarrhea.   Evaluation in the ED: Patient temperature 101, oxygen saturation 92 on 2 L.  Sodium 133 chloride 97, BUN 18, creatinine 1.93, calcium 8.0, albumin 3.0, AST 60, LDH 344, ferritin 711, CRP 9.6, procalcitonin 0.3 lactic acid 1.8, white blood cell 7.5, hemoglobin 13, d-dimer 0.9, fibrinogen 626. Chest x-ray:Worsened lung aeration with an increase in bilateral hazy airspace opacities consistent with multifocal pneumonia, pattern consistent with the diagnosis of COVID-19.   Assessment & Plan:   Active Problems:   Lower respiratory tract infection due to COVID-19 virus  1-Acute hypoxic respiratory failure secondary to COVID-19 pneumonia Patient was diagnosed with Covid 19, [facility] Currently requiring heated high flow at 45 L/min, saw pt as he went from prone to supine.  Sats in 70's, gradually improving to 80's (mosly low to mid).   Continue decadron Continue remdesivir He received actemra on 8/1 PM Prone as tolerated Lovenox BID -> he had mild hemoptysis last night per nurse (he doesn't recall).  Hb stable and d dimer rising, will plan to start 40 mg BID and monitor closely.  Appreciate pulmonology consultation I/O, daily weights Follow daily  inflammatory labs - some improvement in ferritin, CRP.  D dimer rising. Attempting transfer to [facility] today.  Addendum: Discussed with MD's at [facility], recommended giving 2nd dose of actemra.  Discussed with patient and daughter.  Discussed unknown benefit.  # Hemoptysis: small volume.  Sounds like some noted by nursing last night.  Will continue to monitor closely on BID lovenox.  # Hypertension:  BP's fluctuate widely, but most elevated, many significantly so.  He's got hydral and labetalol prn for SBP > 180.  Start amlodipine 5 mg daily and HCTZ 12.5 mg daily, titrate and add medications as needed  2-Elevated LFT's:  - mild, continue to monitor - follow acute hepatitis panel - likely 2/2 above  4-history of prostate cancer: Status post surgery 5 years ago.  # Elevated Blood Sugars: likely related to steroids.  Add sensitive SSI TIDAC and follow A1c.  DVT prophylaxis: lovenox Code Status: full  Family Communication: daughter/wife 8/3.  Will discuss again on today. Disposition Plan: transfer to [facility] when bed available - maybe difficult due to high O2 needs   Consultants:   PCCM  Procedures:   none  Antimicrobials:  Anti-infectives (From admission, onward)   Start     Dose/Rate Route Frequency Ordered Stop   02/18/19 1800  remdesivir 100 mg in sodium chloride 0.9 % 250 mL IVPB     100 mg 500 mL/hr over 30 Minutes Intravenous Every 24 hours 02/17/19 1355 02/22/19 1759   02/17/19 1530  remdesivir 200 mg in sodium chloride 0.9 % 250 mL IVPB     200 mg  500 mL/hr over 30 Minutes Intravenous Once 02/17/19 1336 02/17/19 1808      Subjective: Notes some SOB. Needed to catch his breath after going from prone to supine. Asking about [facility].    Objective: Vitals:   02/20/19 0500 02/20/19 0615 02/20/19 0800 02/20/19 0834  BP:  (!) 156/92 (!) 164/108   Pulse: 77 82 87 95  Resp:    (!) 28  Temp:      TempSrc:      SpO2: 92% 92% (!) 86% (!) 85%  Weight:       Height:        Intake/Output Summary (Last 24 hours) at 02/20/2019 0853 Last data filed at 02/19/2019 2300 Gross per 24 hour  Intake 511.43 ml  Output 1800 ml  Net -1288.57 ml   Filed Weights   02/17/19 1058 02/17/19 1124 02/19/19 0500  Weight: 93 kg 93 kg 97.5 kg    Examination:  General: No acute distress. Cardiovascular: Heart sounds show Ronald Arnold regular rate, and rhythm Lungs: increased wob on HFNC Abdomen: Soft, nontender, nondistended Neurological: Alert and oriented 3. Moves all extremities 4. Cranial nerves II through XII grossly intact. Skin: Warm and dry. No rashes or lesions. Extremities: No clubbing or cyanosis. No edema.    Data Reviewed: I have personally reviewed following labs and imaging studies  CBC: Recent Labs  Lab 02/15/19 1141 02/17/19 1112 02/18/19 0502 02/19/19 0151 02/20/19 0225  WBC 7.9 7.5 7.8 7.1 7.0  NEUTROABS 6.4 6.6  --  6.4 6.0  HGB 15.2 13.8 15.7 15.9 15.1  HCT 46.6 40.5 49.0 48.6 45.5  MCV 94.0 92.9 95.1 93.6 93.4  PLT 167 160 208 258 785   Basic Metabolic Panel: Recent Labs  Lab 02/15/19 1357 02/17/19 1112 02/18/19 0502 02/19/19 0151 02/20/19 0225  NA 135 133* 138 141 138  K 4.0 3.6 4.0 4.2 4.0  CL 100 97* 103 102 101  CO2 23 24 24 26 26   GLUCOSE 129* 179* 133* 137* 150*  BUN 20 18 18 23  27*  CREATININE 0.98 0.93 0.85 0.85 0.86  CALCIUM 8.0* 8.0* 8.1* 8.2* 8.1*  MG  --   --   --  2.1 2.0   GFR: Estimated Creatinine Clearance: 105.6 mL/min (by C-G formula based on SCr of 0.86 mg/dL). Liver Function Tests: Recent Labs  Lab 02/15/19 1357 02/17/19 1112 02/18/19 0502 02/19/19 0151 02/20/19 0225  AST 68* 60* 67* 68* 68*  ALT 46* 42 47* 51* 60*  ALKPHOS 43 34* 42 45 44  BILITOT 0.5 0.6 0.6 0.4 0.7  PROT 7.3 6.5 7.0 6.8 6.3*  ALBUMIN 3.7 3.0* 3.0* 3.0* 2.8*   No results for input(s): LIPASE, AMYLASE in the last 168 hours. No results for input(s): AMMONIA in the last 168 hours. Coagulation Profile: No results for  input(s): INR, PROTIME in the last 168 hours. Cardiac Enzymes: No results for input(s): CKTOTAL, CKMB, CKMBINDEX, TROPONINI in the last 168 hours. BNP (last 3 results) No results for input(s): PROBNP in the last 8760 hours. HbA1C: No results for input(s): HGBA1C in the last 72 hours. CBG: No results for input(s): GLUCAP in the last 168 hours. Lipid Profile: Recent Labs    02/19/19 0151 02/20/19 0225  TRIG 104 113   Thyroid Function Tests: No results for input(s): TSH, T4TOTAL, FREET4, T3FREE, THYROIDAB in the last 72 hours. Anemia Panel: Recent Labs    02/19/19 0151 02/20/19 0225  FERRITIN 955* 775*   Sepsis Labs: Recent Labs  Lab  02/17/19 1112 02/17/19 1714  PROCALCITON 0.33  --   LATICACIDVEN 1.8 2.1*    Recent Results (from the past 240 hour(s))  Blood Culture (routine x 2)     Status: None (Preliminary result)   Collection Time: 02/17/19 12:30 PM   Specimen: BLOOD RIGHT HAND  Result Value Ref Range Status   Specimen Description   Final    BLOOD RIGHT HAND Performed at [facility] [address] [place], [place] [address]    Special Requests   Final    BOTTLES DRAWN AEROBIC AND ANAEROBIC Blood Culture adequate volume Performed at [facility] [address] [place], [place] [address]    Culture   Final    NO GROWTH 2 DAYS Performed at [facility] [address], [place], [place] [address]    Report Status PENDING  Incomplete  Blood Culture (routine x 2)     Status: None (Preliminary result)   Collection Time: 02/17/19  5:14 PM   Specimen: BLOOD LEFT ARM  Result Value Ref Range Status   Specimen Description   Final    BLOOD LEFT ARM Performed at [facility], [place] [address] [place], [place] [address]    Special Requests   Final    BOTTLES DRAWN AEROBIC AND ANAEROBIC Blood Culture results may not be optimal due to an excessive volume of blood received in culture bottles Performed at [facility] [address], [place], [place] [address]    Culture   Final    NO GROWTH 2 DAYS Performed at [facility] [address] [place], [place] [address]    Report Status PENDING  Incomplete  MRSA PCR Screening     Status: None   Collection Time: 02/18/19  1:05 PM   Specimen: Nasal Mucosa; Nasopharyngeal  Result Value Ref Range Status   MRSA by PCR NEGATIVE NEGATIVE Final    Comment:        The GeneXpert MRSA Assay (FDA approved for NASAL specimens only), is one component of Baylynn Shifflett comprehensive MRSA colonization surveillance program. It is not intended to diagnose MRSA infection nor to guide or monitor treatment for MRSA infections. Performed at [facility], [place] [address], [place], [place] [address]          Radiology Studies: No results found.      Scheduled Meds: . chlorhexidine  15 mL Mouth Rinse BID  . Chlorhexidine Gluconate Cloth  6 each Topical Q0600  . cholecalciferol  2,000 Units Oral Daily  . dexamethasone  6 mg Oral Daily  . enoxaparin (LOVENOX) injection  40 mg Subcutaneous Q12H  . fluticasone  1 spray Each Nare Daily  . loratadine  10 mg Oral Daily  . mouth rinse  15 mL Mouth Rinse q12n4p  . multivitamin with minerals  1 tablet Oral Daily  . pantoprazole  40 mg Oral BID AC  . vitamin C  1,000 mg Oral Daily   Continuous Infusions: . remdesivir 100 mg in NS 250 mL Stopped (02/19/19 2224)     LOS: 3 days    Time spent: over 30 min 40 min critical care with AHRF 2/2 COVID 19 pneumonia   Fayrene Helper, MD [facility] Pager AMION  If 7PM-7AM, please contact night-coverage [email or web address] Password [facility] 02/20/2019, 8:53 AM

## 2019-02-20 NOTE — Progress Notes (Signed)
73 yowm never smoker with remote h/o prostate ca and dvt 2015 admitted 8/1 on day 10 of covid infection with worsening sob and gradually needing higher flow 02 since then despite rx with dex/remdesivir so PCCM consulted pm 8/2 for ideas to help him oxygenate better - note having intermittent hemoptysis also on lovenox since admit.   He is able to oxygenate in the low 90s on nonrebreather and plan is to transfer him to [facility] today. He has been started back on full dose Lovenox, had trace hemoptysis yesterday Dexamethasone and Remdesevir to continue  Kara Mead MD. FCCP. [facility] Pager [phone] If no response call [phone]   02/20/2019

## 2019-02-20 NOTE — Progress Notes (Signed)
Placed patient on nonrebreather at 15L to determine if he was safe to transport to [facility]. Patient's oxygen saturations were in the low 80s on 45L on the heated HFNC. After placing the NRB his saturations improved to low 90s and he reported that it felt easier to breathe.

## 2019-02-21 DIAGNOSIS — J1282 Pneumonia due to coronavirus disease 2019: Secondary | ICD-10-CM | POA: Diagnosis present

## 2019-02-21 DIAGNOSIS — E785 Hyperlipidemia, unspecified: Secondary | ICD-10-CM | POA: Diagnosis present

## 2019-02-21 DIAGNOSIS — U071 COVID-19: Secondary | ICD-10-CM | POA: Diagnosis present

## 2019-02-21 DIAGNOSIS — J9601 Acute respiratory failure with hypoxia: Secondary | ICD-10-CM | POA: Diagnosis present

## 2019-02-21 DIAGNOSIS — R0902 Hypoxemia: Secondary | ICD-10-CM | POA: Diagnosis present

## 2019-02-21 DIAGNOSIS — I10 Essential (primary) hypertension: Secondary | ICD-10-CM

## 2019-02-21 DIAGNOSIS — R7303 Prediabetes: Secondary | ICD-10-CM | POA: Diagnosis present

## 2019-02-21 LAB — CBC WITH DIFFERENTIAL/PLATELET
Abs Immature Granulocytes: 0.14 10*3/uL — ABNORMAL HIGH (ref 0.00–0.07)
Basophils Absolute: 0 10*3/uL (ref 0.0–0.1)
Basophils Relative: 0 %
Eosinophils Absolute: 0 10*3/uL (ref 0.0–0.5)
Eosinophils Relative: 0 %
HCT: 47.9 % (ref 39.0–52.0)
Hemoglobin: 15.9 g/dL (ref 13.0–17.0)
Immature Granulocytes: 2 %
Lymphocytes Relative: 12 %
Lymphs Abs: 0.8 10*3/uL (ref 0.7–4.0)
MCH: 30.9 pg (ref 26.0–34.0)
MCHC: 33.2 g/dL (ref 30.0–36.0)
MCV: 93 fL (ref 80.0–100.0)
Monocytes Absolute: 0.2 10*3/uL (ref 0.1–1.0)
Monocytes Relative: 3 %
Neutro Abs: 5.7 10*3/uL (ref 1.7–7.7)
Neutrophils Relative %: 83 %
Platelets: 247 10*3/uL (ref 150–400)
RBC: 5.15 MIL/uL (ref 4.22–5.81)
RDW: 12.8 % (ref 11.5–15.5)
WBC: 6.8 10*3/uL (ref 4.0–10.5)
nRBC: 0 % (ref 0.0–0.2)

## 2019-02-21 LAB — COMPREHENSIVE METABOLIC PANEL
ALT: 72 U/L — ABNORMAL HIGH (ref 0–44)
AST: 73 U/L — ABNORMAL HIGH (ref 15–41)
Albumin: 3 g/dL — ABNORMAL LOW (ref 3.5–5.0)
Alkaline Phosphatase: 48 U/L (ref 38–126)
Anion gap: 10 (ref 5–15)
BUN: 29 mg/dL — ABNORMAL HIGH (ref 8–23)
CO2: 26 mmol/L (ref 22–32)
Calcium: 8.4 mg/dL — ABNORMAL LOW (ref 8.9–10.3)
Chloride: 100 mmol/L (ref 98–111)
Creatinine, Ser: 0.9 mg/dL (ref 0.61–1.24)
GFR calc Af Amer: >60 mL/min (ref >60)
GFR calc non Af Amer: >60 mL/min (ref >60)
Glucose, Bld: 143 mg/dL — ABNORMAL HIGH (ref 70–99)
Potassium: 4.2 mmol/L (ref 3.5–5.1)
Sodium: 136 mmol/L (ref 135–145)
Total Bilirubin: 0.6 mg/dL (ref 0.3–1.2)
Total Protein: 6.7 g/dL (ref 6.5–8.1)

## 2019-02-21 LAB — FERRITIN: Ferritin: 1032 ng/mL — ABNORMAL HIGH (ref 24–336)

## 2019-02-21 LAB — C-REACTIVE PROTEIN: CRP: 2.7 mg/dL — ABNORMAL HIGH (ref <1.0)

## 2019-02-21 LAB — STREP PNEUMONIAE URINARY ANTIGEN: Strep Pneumo Urinary Antigen: NEGATIVE

## 2019-02-21 LAB — GLUCOSE, CAPILLARY
Glucose-Capillary: 107 mg/dL — ABNORMAL HIGH (ref 70–99)
Glucose-Capillary: 119 mg/dL — ABNORMAL HIGH (ref 70–99)
Glucose-Capillary: 157 mg/dL — ABNORMAL HIGH (ref 70–99)

## 2019-02-21 LAB — MAGNESIUM: Magnesium: 2.2 mg/dL (ref 1.7–2.4)

## 2019-02-21 LAB — TRIGLYCERIDES: Triglycerides: 151 mg/dL — ABNORMAL HIGH (ref <150)

## 2019-02-21 LAB — ABO/RH: ABO/RH(D): O POS

## 2019-02-21 LAB — D-DIMER, QUANTITATIVE: D-Dimer, Quant: 15.44 ug/mL-FEU — ABNORMAL HIGH (ref 0.00–0.50)

## 2019-02-21 MED ORDER — IPRATROPIUM BROMIDE HFA 17 MCG/ACT IN AERS
2.0000 | INHALATION_SPRAY | Freq: Four times a day (QID) | RESPIRATORY_TRACT | Status: DC
  Administered 2019-02-22 – 2019-03-04 (×40): 2 via RESPIRATORY_TRACT
  Filled 2019-02-21: qty 12.9

## 2019-02-21 MED ORDER — IPRATROPIUM BROMIDE HFA 17 MCG/ACT IN AERS
2.0000 | INHALATION_SPRAY | Freq: Three times a day (TID) | RESPIRATORY_TRACT | Status: DC
  Administered 2019-02-21 (×2): 2 via RESPIRATORY_TRACT
  Filled 2019-02-21 (×2): qty 12.9

## 2019-02-21 MED ORDER — ZINC SULFATE 220 (50 ZN) MG PO CAPS
220.0000 mg | ORAL_CAPSULE | Freq: Every day | ORAL | Status: DC
  Administered 2019-02-21 – 2019-03-04 (×12): 220 mg via ORAL
  Filled 2019-02-21 (×12): qty 1

## 2019-02-21 MED ORDER — METFORMIN HCL 500 MG PO TABS
500.0000 mg | ORAL_TABLET | Freq: Every day | ORAL | Status: DC
  Administered 2019-02-22 – 2019-02-27 (×6): 500 mg via ORAL
  Filled 2019-02-21 (×8): qty 1

## 2019-02-21 MED ORDER — METHYLPREDNISOLONE SODIUM SUCC 125 MG IJ SOLR
60.0000 mg | Freq: Three times a day (TID) | INTRAMUSCULAR | Status: DC
  Administered 2019-02-21 – 2019-02-22 (×4): 60 mg via INTRAVENOUS
  Filled 2019-02-21 (×4): qty 2

## 2019-02-21 MED ORDER — LEVALBUTEROL TARTRATE 45 MCG/ACT IN AERO
2.0000 | INHALATION_SPRAY | Freq: Four times a day (QID) | RESPIRATORY_TRACT | Status: DC
  Administered 2019-02-22 – 2019-03-04 (×40): 2 via RESPIRATORY_TRACT
  Filled 2019-02-21: qty 15

## 2019-02-21 MED ORDER — LEVALBUTEROL TARTRATE 45 MCG/ACT IN AERO
2.0000 | INHALATION_SPRAY | Freq: Three times a day (TID) | RESPIRATORY_TRACT | Status: DC
  Administered 2019-02-21 (×2): 2 via RESPIRATORY_TRACT
  Filled 2019-02-21 (×2): qty 15

## 2019-02-21 NOTE — Progress Notes (Signed)
NAME:  Ronald Arnold, MRN:  [id number], DOB:  19-Sep-1955, LOS: 4 ADMISSION DATE:  02/17/2019, CONSULTATION DATE:  02/18/19 REFERRING MD:  Florene Glen, CHIEF COMPLAINT:  Dyspnea  Brief History   63 y/o male admitted for COVID 19 pneumonia causing acute respiratory failure with hypxoemia on 8/1.  Initially admitted on [facility], transferred to [facility] on 8/5 when a bed was available.     Past Medical History  Prostate cancer s/p prostatectomy 2015 DVT 2015  Significant Hospital Events   8/1 admission 8/2 placed in ICU, heated high flow, prone positioning 8/5 moved to [facility] ICU  Consults:  PCCM  Procedures:    Significant Diagnostic Tests:    Micro Data:  7/27 SARS COV2 positive BC x 2 8/1 >> ng  MRSA screen 8/2 neg  Antimicrobials/COVID treatment:  8/1 decadron >  8/1 tocilizumab >  8/1 remdesivir >   Interim history/subjective:   Moved to the [facility] ICU today, feeling somewhat better but still has sinus congestion  Objective   Blood pressure 123/85, pulse 96, temperature 98.9 F (37.2 C), temperature source Axillary, resp. rate 19, height 5\' 10"  (1.778 m), weight 97.5 kg, SpO2 (!) 87 %.    FiO2 (%):  [100 %] 100 %   Intake/Output Summary (Last 24 hours) at 02/21/2019 1130 Last data filed at 02/21/2019 0930 Gross per 24 hour  Intake 350 ml  Output 2100 ml  Net -1750 ml   Filed Weights   02/17/19 1058 02/17/19 1124 02/19/19 0500  Weight: 93 kg 93 kg 97.5 kg    Examination:  General:  Resting comfortably in bed HENT: NCAT OP clear PULM: Crackles bases B, normal effort CV: RRR, no mgr GI: BS+, soft, nontender MSK: normal bulk and tone Neuro: awake, alert, no distress, MAEW  August 4 chest x-ray shows diffuse bilateral airspace disease right greater than left, progressive compared to July 28 study  Resolved Hospital Problem list     Assessment & Plan:  COVID-19 pneumonia causing ARDS: Oxygenation stable on heated high flow Plan decadron 10 days Plan  remdesivir 5 days Admit to [facility] ICU for close monitoring, high risk for intubation Tolerate periods of hypoxemia, goal at rest is greater than 85% SaO2, with movement ideally above 75% Decision for intubation should be based on a change in mental status or physical evidence of ventilatory failure such as nasal flaring, accessory muscle use, paradoxical breathing Out of bed to chair as able Incentive spirometry is important, use every hour Prone positioning while in bed   Best practice:  Diet: regular diet Pain/Anxiety/Delirium protocol (if indicated): n/a VAP protocol (if indicated): n/a DVT prophylaxis: lovenox bid per COVID protocol GI prophylaxis: n/a Glucose control: monitor, per TRH Mobility: out of bed to chair today Code Status: full Family Communication: none bedside, will discuss with TRH Disposition: remain in ICU  Labs   CBC: Recent Labs  Lab 02/15/19 1141 02/17/19 1112 02/18/19 0502 02/19/19 0151 02/20/19 0225 02/21/19 0228  WBC 7.9 7.5 7.8 7.1 7.0 6.8  NEUTROABS 6.4 6.6  --  6.4 6.0 5.7  HGB 15.2 13.8 15.7 15.9 15.1 15.9  HCT 46.6 40.5 49.0 48.6 45.5 47.9  MCV 94.0 92.9 95.1 93.6 93.4 93.0  PLT 167 160 208 258 239 448    Basic Metabolic Panel: Recent Labs  Lab 02/17/19 1112 02/18/19 0502 02/19/19 0151 02/20/19 0225 02/21/19 0228  NA 133* 138 141 138 136  K 3.6 4.0 4.2 4.0 4.2  CL 97* 103 102 101 100  CO2 24 24 26 26 26   GLUCOSE 179* 133* 137* 150* 143*  BUN 18 18 23  27* 29*  CREATININE 0.93 0.85 0.85 0.86 0.90  CALCIUM 8.0* 8.1* 8.2* 8.1* 8.4*  MG  --   --  2.1 2.0 2.2   GFR: Estimated Creatinine Clearance: 100.9 mL/min (by C-G formula based on SCr of 0.9 mg/dL). Recent Labs  Lab 02/17/19 1112 02/17/19 1714 02/18/19 0502 02/19/19 0151 02/20/19 0225 02/21/19 0228  PROCALCITON 0.33  --   --   --   --   --   WBC 7.5  --  7.8 7.1 7.0 6.8  LATICACIDVEN 1.8 2.1*  --   --   --   --     Liver Function Tests: Recent Labs  Lab 02/17/19 1112  02/18/19 0502 02/19/19 0151 02/20/19 0225 02/21/19 0228  AST 60* 67* 68* 68* 73*  ALT 42 47* 51* 60* 72*  ALKPHOS 34* 42 45 44 48  BILITOT 0.6 0.6 0.4 0.7 0.6  PROT 6.5 7.0 6.8 6.3* 6.7  ALBUMIN 3.0* 3.0* 3.0* 2.8* 3.0*   No results for input(s): LIPASE, AMYLASE in the last 168 hours. No results for input(s): AMMONIA in the last 168 hours.  ABG    Component Value Date/Time   PHART 7.467 (H) 02/17/2019 2340   PCO2ART 34.8 02/17/2019 2340   PO2ART 78.8 (L) 02/17/2019 2340   HCO3 24.1 02/17/2019 2340   TCO2 26 07/04/2014 1527   O2SAT 94.0 02/17/2019 2340     Coagulation Profile: No results for input(s): INR, PROTIME in the last 168 hours.  Cardiac Enzymes: No results for input(s): CKTOTAL, CKMB, CKMBINDEX, TROPONINI in the last 168 hours.  HbA1C: Hgb A1c MFr Bld  Date/Time Value Ref Range Status  02/20/2019 02:25 AM 6.2 (H) 4.8 - 5.6 % Final    Comment:    (NOTE) Pre diabetes:          5.7%-6.4% Diabetes:              >6.4% Glycemic control for   <7.0% adults with diabetes     CBG: Recent Labs  Lab 02/20/19 1324 02/20/19 1745 02/20/19 2122 02/21/19 0754  GLUCAP 136* 164* 210* 107*     Critical care time: 31 minutes    Roselie Awkward, MD [facility] Pager: [phone] Cell: [phone] If no response, call [phone]

## 2019-02-21 NOTE — Progress Notes (Signed)
Spoke with daughter Belenda Cruise. Gave update on transfer. Appreciates the care he is receiving.

## 2019-02-21 NOTE — Progress Notes (Addendum)
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  Presents complaining of worsening shortness of breath. Patient currently day 10 of symptoms for COVID-19. He was diagnosed with cough with on Monday (7/27), at [facility] clinic.He presents complaining of worsening shortness of breath, cough. His oxygen dropped yesterday. He was able to be on 3 L of oxygen at home, his PCP for help arrange oxygen at home.  He denies chest pain, abdominal pain, diarrhea.   Evaluation in the ED: Patient temperature 101, oxygen saturation 92 on 2 L.Sodium 133 chloride 97, BUN 18, creatinine 1.93, calcium 8.0, albumin 3.0, AST 60, LDH 344, ferritin 711, CRP 9.6, procalcitonin 0.3 lactic acid 1.8, white blood cell 7.5, hemoglobin 13,d-dimer 0.9, fibrinogen 626. Chest x-ray:Worsened lung aeration with an increase in bilateral hazy airspace opacities consistent with multifocal pneumonia, pattern consistent with the diagnosis of COVID-19.   Subjective: A/O x 4 positive SOB, Positicve Cough, negative N/V.States went out for [employer] with family and now Wife and  Daughter COVID positive (did not require hospitalization), Son COVID negative.   Assessment & Plan:   Principal Problem:   Pneumonia due to COVID-19 virus Active Problems:   Lower respiratory tract infection due to COVID-19 virus   HTN (hypertension)   HLD (hyperlipidemia)   Acute respiratory failure with hypoxia (HCC)   Acute Respiratory Failure with Hypoxia, COVID Pneumonia -Dx positive COVID 19 at your outpatient clinic. - Continue HHFNC.  Titrate to maintain SPO2> 90%.  However will tolerate permissive hypoxia when patient is active and as long as patient does not have AMS. - Solu-Medrol 60 mg TID -  Xopenex 3 times daily -Ipratropium 3 times daily - Remdesivir  per pharmacy protocol - 8/4 Actemra  - Vitamins per COVID protocol. - Currently patient doing well on HHFNC if O2 requirements increase prone patient for minimum of 2 -3 hours every 12 hours.  If patient can tolerate prone for 16 hours/day. - Daily COVID inflammatory markers pending  Essential HTN - Per patient at home no need for BP medication. - Continue amlodipine 5 mg daily - Hydralazine PRN - Hydrochlorothiazide 12.5 mg daily  Elevated LFTs - Monitor closely Recent Labs  Lab 02/17/19 1112 02/18/19 0502 02/19/19 0151 02/20/19 0225 02/21/19 0228  AST 60* 67* 68* 68* 73*   Recent Labs  Lab 02/17/19 1112 02/18/19 0502 02/19/19 0151 02/20/19 0225 02/21/19 0228  ALT 42 47* 51* 60* 72*   Prediabetes -8/4 hemoglobin A1c= 6.4 - Most likely exacerbated by steroids -8/5 metformin 500 mg daily - Sensitive SSI - Lipid panel pending  Hemoptysis -Per previous note patient was noted to have hemoptysis.  Monitor closely -Monitor H/H closely Recent Labs  Lab 02/17/19 1112 02/18/19 0502 02/19/19 0151 02/20/19 0225 02/21/19 0228  HGB 13.8 15.7 15.9 15.1 15.9  -Hemoglobin stable   Hx prostate cancer -S/p surgery 5 years ago.    DVT prophylaxis: Lovenox BID code Status:  Family Communication: Spoke with Curt Bears and brought her up-to-date on plan of care for her father. Disposition Plan: TBD   Consultants:  PCCM  Procedures/Significant Events:     I have personally reviewed and interpreted all radiology studies and my findings are as above.  VENTILATOR SETTINGS: HHFNC    Cultures 8/1 blood RIGHT hand  NGTD 8/1 blood left arm NGTD 8/1 acute hepatitis panel negative 8/1 HIV negative 8/2 MRSA by PCR negative   Antimicrobials: Anti-infectives (From admission, onward)   Start     Stop   02/18/19 1800  remdesivir 100 mg in sodium chloride 0.9 % 250 mL IVPB     02/22/19 1759   02/17/19  1530  remdesivir 200 mg in sodium chloride 0.9 % 250 mL IVPB     02/17/19 1808       Devices    LINES / TUBES:      Continuous Infusions: . remdesivir 100 mg in NS 250 mL Stopped (02/20/19 1858)     Objective: Vitals:   02/21/19 0800 02/21/19 0920 02/21/19 0959 02/21/19 1000  BP: 137/78 137/65 (!) 143/88 123/85  Pulse: 87 (!) 101 (!) 106 96  Resp:  (!) 33 (!) 34 19  Temp:      TempSrc:      SpO2: 96% 97% (!) 79% (!) 87%  Weight:      Height:        Intake/Output Summary (Last 24 hours) at 02/21/2019 1052 Last data filed at 02/21/2019 0930 Gross per 24 hour  Intake 350 ml  Output 2100 ml  Net -1750 ml   Filed Weights   02/17/19 1058 02/17/19 1124 02/19/19 0500  Weight: 93 kg 93 kg 97.5 kg    Examination:  General: A/O x4, positive acute respiratory distress Eyes: negative scleral hemorrhage, negative anisocoria, negative icterus ENT: Negative Runny nose, negative gingival bleeding, Neck:  Negative scars, masses, torticollis, lymphadenopathy, JVD Lungs: Tachypneic, decreased breath sounds bilateral, without wheezes or crackles Cardiovascular: Tachycardic, without murmur gallop or rub normal S1 and S2 Abdomen: negative abdominal pain, nondistended, positive soft, bowel sounds, no rebound, no ascites, no appreciable mass Extremities: No significant cyanosis, clubbing, or edema bilateral lower extremities Skin: Negative rashes, lesions, ulcers Psychiatric:  Negative depression, negative anxiety, negative fatigue, negative mania  Central nervous system:  Cranial nerves II through XII intact, tongue/uvula midline, all extremities muscle strength 5/5, sensation intact throughout,  negative dysarthria, negative expressive aphasia, negative receptive aphasia.  .     Data Reviewed: Care during the described time interval was provided by me .  I have reviewed this patient's available data, including medical history, events of note, physical examination, and all test  results as part of my evaluation.   CBC: Recent Labs  Lab 02/15/19 1141 02/17/19 1112 02/18/19 0502 02/19/19 0151 02/20/19 0225 02/21/19 0228  WBC 7.9 7.5 7.8 7.1 7.0 6.8  NEUTROABS 6.4 6.6  --  6.4 6.0 5.7  HGB 15.2 13.8 15.7 15.9 15.1 15.9  HCT 46.6 40.5 49.0 48.6 45.5 47.9  MCV 94.0 92.9 95.1 93.6 93.4 93.0  PLT 167 160 208 258 239 892   Basic Metabolic Panel: Recent Labs  Lab 02/17/19 1112 02/18/19 0502 02/19/19 0151 02/20/19 0225 02/21/19 0228  NA 133* 138 141 138 136  K 3.6 4.0 4.2 4.0 4.2  CL 97* 103 102 101 100  CO2 24 24 26 26 26   GLUCOSE 179* 133* 137* 150* 143*  BUN 18 18 23  27* 29*  CREATININE 0.93 0.85 0.85 0.86 0.90  CALCIUM 8.0* 8.1* 8.2* 8.1* 8.4*  MG  --   --  2.1 2.0 2.2   GFR: Estimated Creatinine Clearance: 100.9 mL/min (by C-G formula based on SCr of 0.9 mg/dL). Liver Function Tests: Recent Labs  Lab 02/17/19 1112 02/18/19 0502 02/19/19 0151 02/20/19 0225 02/21/19 0228  AST 60* 67* 68*  68* 73*  ALT 42 47* 51* 60* 72*  ALKPHOS 34* 42 45 44 48  BILITOT 0.6 0.6 0.4 0.7 0.6  PROT 6.5 7.0 6.8 6.3* 6.7  ALBUMIN 3.0* 3.0* 3.0* 2.8* 3.0*   No results for input(s): LIPASE, AMYLASE in the last 168 hours. No results for input(s): AMMONIA in the last 168 hours. Coagulation Profile: No results for input(s): INR, PROTIME in the last 168 hours. Cardiac Enzymes: No results for input(s): CKTOTAL, CKMB, CKMBINDEX, TROPONINI in the last 168 hours. BNP (last 3 results) No results for input(s): PROBNP in the last 8760 hours. HbA1C: Recent Labs    02/20/19 0225  HGBA1C 6.2*   CBG: Recent Labs  Lab 02/20/19 1324 02/20/19 1745 02/20/19 2122  GLUCAP 136* 164* 210*   Lipid Profile: Recent Labs    02/20/19 0225 02/21/19 0228  TRIG 113 151*   Thyroid Function Tests: No results for input(s): TSH, T4TOTAL, FREET4, T3FREE, THYROIDAB in the last 72 hours. Anemia Panel: Recent Labs    02/20/19 0225 02/21/19 0228  FERRITIN 775* 1,032*    Urine analysis: No results found for: COLORURINE, APPEARANCEUR, LABSPEC, PHURINE, GLUCOSEU, HGBUR, BILIRUBINUR, KETONESUR, PROTEINUR, UROBILINOGEN, NITRITE, LEUKOCYTESUR Sepsis Labs: @LABRCNTIP (procalcitonin:4,lacticidven:4)  ) Recent Results (from the past 240 hour(s))  Blood Culture (routine x 2)     Status: None (Preliminary result)   Collection Time: 02/17/19 12:30 PM   Specimen: BLOOD RIGHT HAND  Result Value Ref Range Status   Specimen Description   Final    BLOOD RIGHT HAND Performed at [facility] [address], [place], [place] [address]    Special Requests   Final    BOTTLES DRAWN AEROBIC AND ANAEROBIC Blood Culture adequate volume Performed at [facility] [address] [place], [place] [address]    Culture   Final    NO GROWTH 3 DAYS Performed at [facility], [place] [address] [place], [place] [address]    Report Status PENDING  Incomplete  Blood Culture (routine x 2)     Status: None (Preliminary result)   Collection Time: 02/17/19  5:14 PM   Specimen: BLOOD LEFT ARM  Result Value Ref Range Status   Specimen Description   Final    BLOOD LEFT ARM Performed at [facility], [place] [address] [place], [place] [address]    Special Requests   Final    BOTTLES DRAWN AEROBIC AND ANAEROBIC Blood Culture results may not be optimal due to an excessive volume of blood received in culture bottles Performed at [facility] [address], [place], [place] [address]    Culture   Final    NO GROWTH 3 DAYS Performed at [facility], [place] [address], [place], [place] [address]    Report Status PENDING  Incomplete  MRSA PCR Screening     Status: None   Collection Time: 02/18/19  1:05 PM   Specimen: Nasal Mucosa; Nasopharyngeal  Result Value Ref Range Status   MRSA by PCR NEGATIVE NEGATIVE Final    Comment:        The GeneXpert MRSA Assay (FDA approved for NASAL specimens only), is one component  of a comprehensive MRSA colonization surveillance program. It is not intended to diagnose MRSA infection nor to guide or monitor treatment for MRSA infections. Performed at [facility], [place] [address] [place], [place] [address]          Radiology Studies: Dg Chest [facility]  Result Date: 02/20/2019 CLINICAL DATA:  Worsening shortness of breath, COVID-19 positive, history prostate cancer EXAM: PORTABLE CHEST 1 VIEW COMPARISON:  Portable exam 1046 hours compared to 02/17/2019 FINDINGS: Stable heart size mediastinal contours. Extensive BILATERAL hours space infiltrates progressive since previous study, least severe in LEFT upper lobe. No pleural effusion or pneumothorax. IMPRESSION: Progressive BILATERAL airspace infiltrates consistent with COVID-19. Electronically Signed   By: Lavonia Dana M.D.   On: 02/20/2019 11:16        Scheduled Meds: . amLODipine  5 mg Oral Daily  . chlorhexidine  15 mL Mouth Rinse BID  . Chlorhexidine Gluconate Cloth  6 each Topical Q0600  . cholecalciferol  2,000 Units Oral Daily  . enoxaparin (LOVENOX) injection  40 mg Subcutaneous Q12H  . feeding supplement  1 Container Oral TID WC  . fluticasone  1 spray Each Nare Daily  . hydrochlorothiazide  12.5 mg Oral Daily  . insulin aspart  0-9 Units Subcutaneous TID WC  . ipratropium  2 puff Inhalation Q8H  . levalbuterol  2 puff Inhalation Q8H  . loratadine  10 mg Oral Daily  . mouth rinse  15 mL Mouth Rinse q12n4p  . methylPREDNISolone (SOLU-MEDROL) injection  60 mg Intravenous TID  . multivitamin with minerals  1 tablet Oral Daily  . pantoprazole  40 mg Oral BID AC  . vitamin C  1,000 mg Oral Daily  . zinc sulfate  220 mg Oral Daily   Continuous Infusions: . remdesivir 100 mg in NS 250 mL Stopped (02/20/19 1858)     LOS: 4 days   The patient is critically ill with multiple organ systems failure and requires high complexity decision making for assessment and support, frequent  evaluation and titration of therapies, application of advanced monitoring technologies and extensive interpretation of multiple databases. Critical Care Time devoted to patient care services described in this note  Time spent: 40 minutes     Macaila Tahir, Geraldo Docker, MD [facility] Pager [phone]  If 7PM-7AM, please contact night-coverage [email or web address] Password [email or web address] 02/21/2019, 10:52 AM

## 2019-02-21 NOTE — Progress Notes (Signed)
PROGRESS NOTE  Ronald Arnold DQQ:[id number] DOB: Feb 04, 1956 DOA: 02/17/2019 PCP: Lawerance Cruel, MD  HPI/Recap of past 24 hours: Ronald Arnold a 63 y.o.malewith past medical history significant for prostate cancer status post surgery 5 years ago,history of DVT 2015 treated with Xarelto who presents complaining of worsening shortness of breath. Patient currently day 10 of symptoms for COVID-19. He was diagnosed with cough with on Monday (7/27), at [facility] clinic.He presents complaining of worsening shortness of breath, cough. His oxygen dropped yesterday. He was able to be on 3 L of oxygen at home, his PCP for help arrange oxygen at home.  He denies chest pain, abdominal pain, diarrhea.   02/21/19: Patient was seen and examined at his bedside this morning.  He reports persistent cough with pinkish mucus, no frank blood.  He denies chest pain, abdominal pain, or diarrhea.  Persistent hypoxia with nonrebreather in the room.  Switched oral steroids to IV Solu-Medrol 60 mg 3 times daily and ordered stat chest CT to rule out PE in the setting of worsening d-dimer.  Assessment/Plan: Active Problems:   Lower respiratory tract infection due to COVID-19 virus   Acute COVID-19 viral pneumonia Diagnosed with COVID-19 outpatient at [facility] outpatient clinic per medical record Monday, 02/12/2019. Independently reviewed chest x-ray which showed bilateral patchy infiltrates consistent with COVID 19. Currently on Remdesivir, has received Actemra Switched oral steroids to IV Solu-Medrol 60 mg 3 times daily Start bronchodilators Xopenex inhaler 2 puffs 3 times daily and ipratropium inhaler 2 puffs 3 times daily Continue vitamin D3, vitamin C Add zinc Maintain O2 saturation greater than 92% Currently requiring nonrebreather, continue Pronate while awake as tolerated Avoid IV fluid hydration, keep volume status euvolemic  Acute hypoxic respiratory failure likely secondary to acute COVID-19  viral pneumonia Management as stated above Continue to monitor inflammatory markers D-dimer trending up to 15 this morning Stat CTA PE ordered to rule out pulmonary embolism Continue DVT prophylaxis, on subcu Lovenox 40 mg twice daily Maintain O2 saturation greater than 92%  Worsening elevated transaminases in the setting of COVID-19 infection Continue to monitor markers while on Remdesivir Acute hepatitis panel negative  Steroid-induced hyperglycemia Not on anti-glycemic's at home, no history of diabetes. Hemoglobin A1c 6.2 on 02/20/2019 Continue coverage with insulin sliding scale, sensitive Avoid hypoglycemia  Intermittent hemoptysis likely secondary to persistent cough versus others CTA PE pending Appears to be clearing out H&H is stable Continue to closely monitor  Essential hypertension Not on antihypertensives at home Blood pressure is at goal Currently on Norvasc 5 mg daily and HCTZ 12.5 mg daily, started in the hospital Continue to monitor vital signs  History of prostate cancer Status post surgery 5 years ago   DVT prophylaxis: lovenox Code Status: full  Family Communication:  Will call family if okay with the patient. Disposition Plan:  Transfer to [facility] on 02/20/2021 continue current management.  Consultants:   PCCM  Procedures:   none     Objective: Vitals:   02/21/19 0600 02/21/19 0714 02/21/19 0716 02/21/19 0742  BP: (!) 154/77  137/80   Pulse: 86  89 84  Resp: (!) 34  (!) 31 (!) 28  Temp:  98.9 F (37.2 C)    TempSrc:  Axillary    SpO2: 93%  92% 93%  Weight:      Height:        Intake/Output Summary (Last 24 hours) at 02/21/2019 0811 Last data filed at 02/21/2019 0100 Gross per 24 hour  Intake 350  ml  Output 1300 ml  Net -950 ml   Filed Weights   02/17/19 1058 02/17/19 1124 02/19/19 0500  Weight: 93 kg 93 kg 97.5 kg    Exam:  . General: 63 y.o. year-old male well developed well nourished in no acute distress.  Alert and oriented  x3. . Cardiovascular: Regular rate and rhythm with no rubs or gallops.  No thyromegaly or JVD noted.   Marland Kitchen Respiratory: Mild diffuse rales bilaterally.  Poor inspiratory effort.   . Abdomen: Soft nontender nondistended with normal bowel sounds x4 quadrants. . Musculoskeletal: Trace lower extremity edema. 2/4 pulses in all 4 extremities. Marland Kitchen Psychiatry: Mood is appropriate for condition and setting   Data Reviewed: CBC: Recent Labs  Lab 02/15/19 1141 02/17/19 1112 02/18/19 0502 02/19/19 0151 02/20/19 0225 02/21/19 0228  WBC 7.9 7.5 7.8 7.1 7.0 6.8  NEUTROABS 6.4 6.6  --  6.4 6.0 5.7  HGB 15.2 13.8 15.7 15.9 15.1 15.9  HCT 46.6 40.5 49.0 48.6 45.5 47.9  MCV 94.0 92.9 95.1 93.6 93.4 93.0  PLT 167 160 208 258 239 353   Basic Metabolic Panel: Recent Labs  Lab 02/17/19 1112 02/18/19 0502 02/19/19 0151 02/20/19 0225 02/21/19 0228  NA 133* 138 141 138 136  K 3.6 4.0 4.2 4.0 4.2  CL 97* 103 102 101 100  CO2 24 24 26 26 26   GLUCOSE 179* 133* 137* 150* 143*  BUN 18 18 23  27* 29*  CREATININE 0.93 0.85 0.85 0.86 0.90  CALCIUM 8.0* 8.1* 8.2* 8.1* 8.4*  MG  --   --  2.1 2.0 2.2   GFR: Estimated Creatinine Clearance: 100.9 mL/min (by C-G formula based on SCr of 0.9 mg/dL). Liver Function Tests: Recent Labs  Lab 02/17/19 1112 02/18/19 0502 02/19/19 0151 02/20/19 0225 02/21/19 0228  AST 60* 67* 68* 68* 73*  ALT 42 47* 51* 60* 72*  ALKPHOS 34* 42 45 44 48  BILITOT 0.6 0.6 0.4 0.7 0.6  PROT 6.5 7.0 6.8 6.3* 6.7  ALBUMIN 3.0* 3.0* 3.0* 2.8* 3.0*   No results for input(s): LIPASE, AMYLASE in the last 168 hours. No results for input(s): AMMONIA in the last 168 hours. Coagulation Profile: No results for input(s): INR, PROTIME in the last 168 hours. Cardiac Enzymes: No results for input(s): CKTOTAL, CKMB, CKMBINDEX, TROPONINI in the last 168 hours. BNP (last 3 results) No results for input(s): PROBNP in the last 8760 hours. HbA1C: Recent Labs    02/20/19 0225  HGBA1C 6.2*    CBG: Recent Labs  Lab 02/20/19 1324 02/20/19 1745 02/20/19 2122  GLUCAP 136* 164* 210*   Lipid Profile: Recent Labs    02/20/19 0225 02/21/19 0228  TRIG 113 151*   Thyroid Function Tests: No results for input(s): TSH, T4TOTAL, FREET4, T3FREE, THYROIDAB in the last 72 hours. Anemia Panel: Recent Labs    02/20/19 0225 02/21/19 0228  FERRITIN 775* 1,032*   Urine analysis: No results found for: COLORURINE, APPEARANCEUR, LABSPEC, PHURINE, GLUCOSEU, HGBUR, BILIRUBINUR, KETONESUR, PROTEINUR, UROBILINOGEN, NITRITE, LEUKOCYTESUR Sepsis Labs: @LABRCNTIP (procalcitonin:4,lacticidven:4)  ) Recent Results (from the past 240 hour(s))  Blood Culture (routine x 2)     Status: None (Preliminary result)   Collection Time: 02/17/19 12:30 PM   Specimen: BLOOD RIGHT HAND  Result Value Ref Range Status   Specimen Description   Final    BLOOD RIGHT HAND Performed at [facility] [address], [place], [place] [address]    Special Requests   Final    BOTTLES DRAWN  AEROBIC AND ANAEROBIC Blood Culture adequate volume Performed at [facility] [address], [place], [place] [address]    Culture   Final    NO GROWTH 3 DAYS Performed at [facility], [place] [address], [place], [place] [address]    Report Status PENDING  Incomplete  Blood Culture (routine x 2)     Status: None (Preliminary result)   Collection Time: 02/17/19  5:14 PM   Specimen: BLOOD LEFT ARM  Result Value Ref Range Status   Specimen Description   Final    BLOOD LEFT ARM Performed at [facility], [place] [address] [place], [place] [address]    Special Requests   Final    BOTTLES DRAWN AEROBIC AND ANAEROBIC Blood Culture results may not be optimal due to an excessive volume of blood received in culture bottles Performed at [facility] [address], [place], [place] [address]    Culture   Final    NO GROWTH 3 DAYS Performed at [facility], [place] [address] [place], [place] [address]    Report Status PENDING  Incomplete  MRSA PCR Screening     Status: None   Collection Time: 02/18/19  1:05 PM   Specimen: Nasal Mucosa; Nasopharyngeal  Result Value Ref Range Status   MRSA by PCR NEGATIVE NEGATIVE Final    Comment:        The GeneXpert MRSA Assay (FDA approved for NASAL specimens only), is one component of a comprehensive MRSA colonization surveillance program. It is not intended to diagnose MRSA infection nor to guide or monitor treatment for MRSA infections. Performed at [facility], [place] [address], [place], [place] [address]       Studies: Dg Chest Port 1 View  Result Date: 02/20/2019 CLINICAL DATA:  Worsening shortness of breath, COVID-19 positive, history prostate cancer EXAM: PORTABLE CHEST 1 VIEW COMPARISON:  Portable exam 1046 hours compared to 02/17/2019 FINDINGS: Stable heart size mediastinal contours. Extensive BILATERAL hours space infiltrates progressive since previous study, least severe in LEFT upper lobe. No pleural effusion or pneumothorax. IMPRESSION: Progressive BILATERAL airspace infiltrates consistent with COVID-19. Electronically Signed   By: Lavonia Dana M.D.   On: 02/20/2019 11:16    Scheduled Meds: . amLODipine  5 mg Oral Daily  . chlorhexidine  15 mL Mouth Rinse BID  . Chlorhexidine Gluconate Cloth  6 each Topical Q0600  . cholecalciferol  2,000 Units Oral Daily  . enoxaparin (LOVENOX) injection  40 mg Subcutaneous Q12H  . feeding supplement  1 Container Oral TID WC  . fluticasone  1 spray Each Nare Daily  . hydrochlorothiazide  12.5 mg Oral Daily  . insulin aspart  0-9 Units Subcutaneous TID WC  . loratadine  10 mg Oral Daily  . mouth rinse  15 mL Mouth Rinse q12n4p  . methylPREDNISolone (SOLU-MEDROL) injection  60 mg Intravenous TID  . multivitamin with minerals  1 tablet Oral Daily  . pantoprazole  40 mg Oral BID AC  . vitamin C  1,000 mg Oral Daily     Continuous Infusions: . remdesivir 100 mg in NS 250 mL Stopped (02/20/19 1858)     LOS: 4 days     Kayleen Memos, MD [facility] Pager [phone]  If 7PM-7AM, please contact night-coverage [email or web address] Password [email or web address] 02/21/2019, 8:11 AM

## 2019-02-21 NOTE — Progress Notes (Signed)
Patient to transfer to [facility] 9202-03 report given to receiving nurse, all questions answered at this time.  Pt. VSS with no s/s of distress noted.  Pt. Will transfer on non-re breather.  Pt. Stable for transfer.  Belongings sent with transport.

## 2019-02-22 DIAGNOSIS — E7800 Pure hypercholesterolemia, unspecified: Secondary | ICD-10-CM

## 2019-02-22 LAB — CULTURE, BLOOD (ROUTINE X 2)
Culture: NO GROWTH
Culture: NO GROWTH
Special Requests: ADEQUATE

## 2019-02-22 LAB — CBC WITH DIFFERENTIAL/PLATELET
Abs Immature Granulocytes: 0.13 10*3/uL — ABNORMAL HIGH (ref 0.00–0.07)
Basophils Absolute: 0 10*3/uL (ref 0.0–0.1)
Basophils Relative: 1 %
Eosinophils Absolute: 0 10*3/uL (ref 0.0–0.5)
Eosinophils Relative: 0 %
HCT: 50.1 % (ref 39.0–52.0)
Hemoglobin: 16.6 g/dL (ref 13.0–17.0)
Immature Granulocytes: 2 %
Lymphocytes Relative: 12 %
Lymphs Abs: 0.7 10*3/uL (ref 0.7–4.0)
MCH: 30.1 pg (ref 26.0–34.0)
MCHC: 33.1 g/dL (ref 30.0–36.0)
MCV: 90.9 fL (ref 80.0–100.0)
Monocytes Absolute: 0.2 10*3/uL (ref 0.1–1.0)
Monocytes Relative: 3 %
Neutro Abs: 4.7 10*3/uL (ref 1.7–7.7)
Neutrophils Relative %: 82 %
Platelets: 352 10*3/uL (ref 150–400)
RBC: 5.51 MIL/uL (ref 4.22–5.81)
RDW: 12.6 % (ref 11.5–15.5)
WBC: 5.8 10*3/uL (ref 4.0–10.5)
nRBC: 0 % (ref 0.0–0.2)

## 2019-02-22 LAB — GLUCOSE, CAPILLARY
Glucose-Capillary: 162 mg/dL — ABNORMAL HIGH (ref 70–99)
Glucose-Capillary: 239 mg/dL — ABNORMAL HIGH (ref 70–99)
Glucose-Capillary: 184 mg/dL — ABNORMAL HIGH (ref 70–99)
Glucose-Capillary: 268 mg/dL — ABNORMAL HIGH (ref 70–99)

## 2019-02-22 LAB — PHOSPHORUS: Phosphorus: 3.6 mg/dL (ref 2.5–4.6)

## 2019-02-22 LAB — COMPREHENSIVE METABOLIC PANEL
ALT: 72 U/L — ABNORMAL HIGH (ref 0–44)
AST: 56 U/L — ABNORMAL HIGH (ref 15–41)
Albumin: 3.3 g/dL — ABNORMAL LOW (ref 3.5–5.0)
Alkaline Phosphatase: 51 U/L (ref 38–126)
Anion gap: 12 (ref 5–15)
BUN: 29 mg/dL — ABNORMAL HIGH (ref 8–23)
CO2: 27 mmol/L (ref 22–32)
Calcium: 8.4 mg/dL — ABNORMAL LOW (ref 8.9–10.3)
Chloride: 96 mmol/L — ABNORMAL LOW (ref 98–111)
Creatinine, Ser: 0.87 mg/dL (ref 0.61–1.24)
GFR calc Af Amer: >60 mL/min (ref >60)
GFR calc non Af Amer: >60 mL/min (ref >60)
Glucose, Bld: 161 mg/dL — ABNORMAL HIGH (ref 70–99)
Potassium: 3.8 mmol/L (ref 3.5–5.1)
Sodium: 135 mmol/L (ref 135–145)
Total Bilirubin: 1 mg/dL (ref 0.3–1.2)
Total Protein: 6.9 g/dL (ref 6.5–8.1)

## 2019-02-22 LAB — HEPATITIS PANEL, ACUTE
HCV Ab: <0.1 s/co ratio (ref 0.0–0.9)
Hep A IgM: NEGATIVE
Hep B C IgM: NEGATIVE
Hepatitis B Surface Ag: NEGATIVE

## 2019-02-22 LAB — LIPID PANEL
Cholesterol: 150 mg/dL (ref 0–200)
HDL: 24 mg/dL — ABNORMAL LOW (ref >40)
LDL Cholesterol: 95 mg/dL (ref 0–99)
Total CHOL/HDL Ratio: 6.3 RATIO
Triglycerides: 155 mg/dL — ABNORMAL HIGH (ref <150)
VLDL: 31 mg/dL (ref 0–40)

## 2019-02-22 LAB — LEGIONELLA PNEUMOPHILA SEROGP 1 UR AG: L. pneumophila Serogp 1 Ur Ag: NEGATIVE

## 2019-02-22 LAB — FERRITIN: Ferritin: 1021 ng/mL — ABNORMAL HIGH (ref 24–336)

## 2019-02-22 LAB — D-DIMER, QUANTITATIVE: D-Dimer, Quant: 14.83 ug/mL-FEU — ABNORMAL HIGH (ref 0.00–0.50)

## 2019-02-22 LAB — C-REACTIVE PROTEIN: CRP: 1.3 mg/dL — ABNORMAL HIGH (ref <1.0)

## 2019-02-22 LAB — MAGNESIUM: Magnesium: 2.1 mg/dL (ref 1.7–2.4)

## 2019-02-22 MED ORDER — ATORVASTATIN CALCIUM 10 MG PO TABS
20.0000 mg | ORAL_TABLET | Freq: Every day | ORAL | Status: DC
  Administered 2019-02-22 – 2019-03-03 (×9): 20 mg via ORAL
  Filled 2019-02-22 (×9): qty 2

## 2019-02-22 MED ORDER — TRAZODONE HCL 50 MG PO TABS
50.0000 mg | ORAL_TABLET | Freq: Every day | ORAL | Status: DC
  Administered 2019-02-22 – 2019-03-03 (×10): 50 mg via ORAL
  Filled 2019-02-22 (×10): qty 1

## 2019-02-22 MED ORDER — OXYMETAZOLINE HCL 0.05 % NA SOLN
1.0000 | Freq: Two times a day (BID) | NASAL | Status: AC
  Administered 2019-02-22 – 2019-02-24 (×6): 1 via NASAL
  Filled 2019-02-22: qty 15

## 2019-02-22 MED ORDER — ORAL CARE MOUTH RINSE
15.0000 mL | Freq: Two times a day (BID) | OROMUCOSAL | Status: DC
  Administered 2019-02-22 – 2019-03-04 (×21): 15 mL via OROMUCOSAL

## 2019-02-22 MED ORDER — INSULIN ASPART 100 UNIT/ML ~~LOC~~ SOLN
0.0000 [IU] | SUBCUTANEOUS | Status: DC
  Administered 2019-02-22: 8 [IU] via SUBCUTANEOUS
  Administered 2019-02-22: 3 [IU] via SUBCUTANEOUS
  Administered 2019-02-23: 8 [IU] via SUBCUTANEOUS
  Administered 2019-02-23: 2 [IU] via SUBCUTANEOUS
  Administered 2019-02-23: 3 [IU] via SUBCUTANEOUS
  Administered 2019-02-23: 5 [IU] via SUBCUTANEOUS
  Administered 2019-02-24: 2 [IU] via SUBCUTANEOUS
  Administered 2019-02-24 (×2): 3 [IU] via SUBCUTANEOUS
  Administered 2019-02-25: 5 [IU] via SUBCUTANEOUS
  Administered 2019-02-25: 8 [IU] via SUBCUTANEOUS
  Administered 2019-02-25 (×3): 3 [IU] via SUBCUTANEOUS
  Administered 2019-02-26: 2 [IU] via SUBCUTANEOUS
  Administered 2019-02-26 – 2019-02-27 (×4): 5 [IU] via SUBCUTANEOUS
  Administered 2019-02-27 – 2019-02-28 (×3): 3 [IU] via SUBCUTANEOUS
  Administered 2019-02-28 (×2): 5 [IU] via SUBCUTANEOUS
  Administered 2019-03-01: 3 [IU] via SUBCUTANEOUS

## 2019-02-22 MED ORDER — METHYLPREDNISOLONE SODIUM SUCC 40 MG IJ SOLR
40.0000 mg | INTRAMUSCULAR | Status: DC
  Administered 2019-02-23 – 2019-02-24 (×2): 40 mg via INTRAVENOUS
  Filled 2019-02-22 (×2): qty 1

## 2019-02-22 NOTE — Progress Notes (Signed)
[facility] PCCM  Spoke to the patient's family at length, son, daughter, and wife by phone.  Questions answered.   Roselie Awkward, MD [facility] Pager: [phone] Cell: [phone] If no response, call [phone]

## 2019-02-22 NOTE — Progress Notes (Signed)
PROGRESS NOTE    Ronald Arnold  EYC:[id number] DOB: 09-Feb-1956 DOA: 02/17/2019 PCP: Lawerance Cruel, MD   Brief Narrative:  Ronald Pizzo Simkinsis a 63 y.o.WM PMHx prostate cancer status post surgery 5 years ago,history of DVT 2015 treated with Xarelto, HTN controlled wo meds, HLD.  Presents complaining of worsening shortness of breath. Patient currently day 10 of symptoms for COVID-19. He was diagnosed with cough with on Monday (7/27), at [facility] clinic.He presents complaining of worsening shortness of breath, cough. His oxygen dropped yesterday. He was able to be on 3 L of oxygen at home, his PCP for help arrange oxygen at home.  He denies chest pain, abdominal pain, diarrhea.   Evaluation in the ED: Patient temperature 101, oxygen saturation 92 on 2 L.Sodium 133 chloride 97, BUN 18, creatinine 1.93, calcium 8.0, albumin 3.0, AST 60, LDH 344, ferritin 711, CRP 9.6, procalcitonin 0.3 lactic acid 1.8, white blood cell 7.5, hemoglobin 13,d-dimer 0.9, fibrinogen 626. Chest x-ray:Worsened lung aeration with an increase in bilateral hazy airspace opacities consistent with multifocal pneumonia, pattern consistent with the diagnosis of COVID-19.   Subjective: 8/6 A/O x4, positive S OB, negative N/V.  States did not sleep well last night.   Assessment & Plan:   Principal Problem:   Pneumonia due to COVID-19 virus Active Problems:   Lower respiratory tract infection due to COVID-19 virus   HTN (hypertension)   HLD (hyperlipidemia)   Acute respiratory failure with hypoxia (HCC)   Hypoxia   Prediabetes   Acute Respiratory Failure with Hypoxia, COVID Pneumonia -Dx positive COVID 19 at your outpatient clinic. - Continue HHFNC.  Titrate to maintain SPO2> 90%.  However will tolerate permissive hypoxia when patient is active and as long as patient does not have AMS. - Solu-Medrol 60 mg TID - Xopenex 3 times daily -Ipratropium 3 times daily - Remdesivir  per pharmacy protocol - 8/4  Actemra  - Vitamins per COVID protocol. - Currently patient doing well on HHFNC if O2 requirements increase prone patient for minimum of 2 -3 hours every 12 hours.  If patient can tolerate prone for 16 hours/day. - Daily COVID inflammatory markers pending Recent Labs  Lab 02/18/19 0502 02/19/19 0151 02/20/19 0225 02/21/19 0228 02/22/19 0710  CRP 14.8* 9.8* 4.4* 2.7* 1.3*   Recent Labs  Lab 02/18/19 0502 02/19/19 0151 02/20/19 0225 02/21/19 0228 02/22/19 0710  DDIMER 1.15* 2.11* 10.69* 15.44* 14.83*   Essential HTN - Per patient at home no need for BP medication. - Continue amlodipine 5 mg daily - Hydralazine PRN - Hydrochlorothiazide 12.5 mg daily  Elevated LFTs - Monitor closely Recent Labs  Lab 02/17/19 1112 02/18/19 0502 02/19/19 0151 02/20/19 0225 02/21/19 0228  AST 60* 67* 68* 68* 73*   Recent Labs  Lab 02/17/19 1112 02/18/19 0502 02/19/19 0151 02/20/19 0225 02/21/19 0228  ALT 42 47* 51* 60* 72*   Prediabetes -8/4 hemoglobin A1c= 6.4 - Most likely exacerbated by steroids -8/5 metformin 500 mg daily - 8/6 increase moderate SSI   HLD - 8/6 lipid panel not within ADA/AHA guidelines - LDL goal<70 - Lipitor 20 mg daily  Hemoptysis -Per previous note patient was noted to have hemoptysis.  Monitor closely -Monitor H/H closely Recent Labs  Lab 02/17/19 1112 02/18/19 0502 02/19/19 0151 02/20/19 0225 02/21/19 0228  HGB 13.8 15.7 15.9 15.1 15.9  -Hemoglobin stable  Hx prostate cancer -S/p surgery 5 years ago.    DVT prophylaxis: Lovenox BID code Status:  Family Communication: Spoke with Curt Bears and  brought her up-to-date on plan of care for her father. Disposition Plan: TBD   Consultants:  PCCM  Procedures/Significant Events:     I have personally reviewed and interpreted all radiology studies and my findings are as above.  VENTILATOR SETTINGS: HHFNC O2 flow rate: 25 L/min FiO2: 80% SPO2: 90%   Cultures 8/1 blood RIGHT hand  NGTD 8/1 blood left arm NGTD 8/1 acute hepatitis panel negative 8/1 HIV negative 8/2 MRSA by PCR negative    Antimicrobials: Anti-infectives (From admission, onward)   Start     Stop   02/18/19 1800  remdesivir 100 mg in sodium chloride 0.9 % 250 mL IVPB     02/22/19 1759   02/17/19 1530  remdesivir 200 mg in sodium chloride 0.9 % 250 mL IVPB     02/17/19 1808       Devices    LINES / TUBES:      Continuous Infusions:    Objective: Vitals:   02/22/19 0500 02/22/19 0600 02/22/19 0700 02/22/19 0750  BP: (!) 143/91 (!) 123/108 (!) 141/93 (!) 141/93  Pulse: 99 (!) 103 (!) 103 97  Resp: (!) 32 (!) 23 (!) 26 (!) 22  Temp:    97.8 F (36.6 C)  TempSrc:    Oral  SpO2: (!) 89% 92% 93% 99%  Weight:      Height:        Intake/Output Summary (Last 24 hours) at 02/22/2019 1610 Last data filed at 02/21/2019 2300 Gross per 24 hour  Intake 490.17 ml  Output 2200 ml  Net -1709.83 ml   Filed Weights   02/17/19 1058 02/17/19 1124 02/19/19 0500  Weight: 93 kg 93 kg 97.5 kg   Physical Exam:  General: A/O x4, positive acute respiratory distress Eyes: negative scleral hemorrhage, negative anisocoria, negative icterus ENT: Negative Runny nose, negative gingival bleeding, Neck:  Negative scars, masses, torticollis, lymphadenopathy, JVD Lungs: Tachypneic, clear to auscultation bilaterally without wheezes or crackles Cardiovascular: Tachycardic, without murmur gallop or rub normal S1 and S2 Abdomen: negative abdominal pain, nondistended, positive soft, bowel sounds, no rebound, no ascites, no appreciable mass Extremities: No significant cyanosis, clubbing, or edema bilateral lower extremities Skin: Negative rashes, lesions, ulcers Psychiatric:  Negative depression, negative anxiety, negative fatigue, negative mania  Central nervous system:  Cranial nerves II through XII intact, tongue/uvula midline, all extremities muscle strength 5/5, sensation intact throughout, negative  dysarthria, negative expressive aphasia, negative receptive aphasia.        Data Reviewed: Care during the described time interval was provided by me .  I have reviewed this patient's available data, including medical history, events of note, physical examination, and all test results as part of my evaluation.   CBC: Recent Labs  Lab 02/15/19 1141 02/17/19 1112 02/18/19 0502 02/19/19 0151 02/20/19 0225 02/21/19 0228  WBC 7.9 7.5 7.8 7.1 7.0 6.8  NEUTROABS 6.4 6.6  --  6.4 6.0 5.7  HGB 15.2 13.8 15.7 15.9 15.1 15.9  HCT 46.6 40.5 49.0 48.6 45.5 47.9  MCV 94.0 92.9 95.1 93.6 93.4 93.0  PLT 167 160 208 258 239 960   Basic Metabolic Panel: Recent Labs  Lab 02/17/19 1112 02/18/19 0502 02/19/19 0151 02/20/19 0225 02/21/19 0228  NA 133* 138 141 138 136  K 3.6 4.0 4.2 4.0 4.2  CL 97* 103 102 101 100  CO2 24 24 26 26 26   GLUCOSE 179* 133* 137* 150* 143*  BUN 18 18 23  27* 29*  CREATININE 0.93 0.85 0.85 0.86 0.90  CALCIUM 8.0* 8.1* 8.2* 8.1* 8.4*  MG  --   --  2.1 2.0 2.2   GFR: Estimated Creatinine Clearance: 100.9 mL/min (by C-G formula based on SCr of 0.9 mg/dL). Liver Function Tests: Recent Labs  Lab 02/17/19 1112 02/18/19 0502 02/19/19 0151 02/20/19 0225 02/21/19 0228  AST 60* 67* 68* 68* 73*  ALT 42 47* 51* 60* 72*  ALKPHOS 34* 42 45 44 48  BILITOT 0.6 0.6 0.4 0.7 0.6  PROT 6.5 7.0 6.8 6.3* 6.7  ALBUMIN 3.0* 3.0* 3.0* 2.8* 3.0*   No results for input(s): LIPASE, AMYLASE in the last 168 hours. No results for input(s): AMMONIA in the last 168 hours. Coagulation Profile: No results for input(s): INR, PROTIME in the last 168 hours. Cardiac Enzymes: No results for input(s): CKTOTAL, CKMB, CKMBINDEX, TROPONINI in the last 168 hours. BNP (last 3 results) No results for input(s): PROBNP in the last 8760 hours. HbA1C: Recent Labs    02/20/19 0225  HGBA1C 6.2*   CBG: Recent Labs  Lab 02/20/19 2122 02/21/19 0754 02/21/19 1131 02/21/19 1650 02/22/19  0745  GLUCAP 210* 107* 119* 157* 162*   Lipid Profile: Recent Labs    02/20/19 0225 02/21/19 0228  TRIG 113 151*   Thyroid Function Tests: No results for input(s): TSH, T4TOTAL, FREET4, T3FREE, THYROIDAB in the last 72 hours. Anemia Panel: Recent Labs    02/20/19 0225 02/21/19 0228  FERRITIN 775* 1,032*   Urine analysis: No results found for: COLORURINE, APPEARANCEUR, LABSPEC, PHURINE, GLUCOSEU, HGBUR, BILIRUBINUR, KETONESUR, PROTEINUR, UROBILINOGEN, NITRITE, LEUKOCYTESUR Sepsis Labs: @LABRCNTIP (procalcitonin:4,lacticidven:4)  ) Recent Results (from the past 240 hour(s))  Blood Culture (routine x 2)     Status: None (Preliminary result)   Collection Time: 02/17/19 12:30 PM   Specimen: BLOOD RIGHT HAND  Result Value Ref Range Status   Specimen Description   Final    BLOOD RIGHT HAND Performed at [facility] [address], [place], [place] [address]    Special Requests   Final    BOTTLES DRAWN AEROBIC AND ANAEROBIC Blood Culture adequate volume Performed at [facility] [address] [place], [place] [address]    Culture   Final    NO GROWTH 4 DAYS Performed at [facility], [place] [address], [place], [place] [address]    Report Status PENDING  Incomplete  Blood Culture (routine x 2)     Status: None (Preliminary result)   Collection Time: 02/17/19  5:14 PM   Specimen: BLOOD LEFT ARM  Result Value Ref Range Status   Specimen Description   Final    BLOOD LEFT ARM Performed at [facility], [place] [address] [place], [place] [address]    Special Requests   Final    BOTTLES DRAWN AEROBIC AND ANAEROBIC Blood Culture results may not be optimal due to an excessive volume of blood received in culture bottles Performed at [facility] [address], [place], [place] [address]    Culture   Final    NO GROWTH 4 DAYS Performed at [facility], [place] [address] [place], [place] [address]     Report Status PENDING  Incomplete  MRSA PCR Screening     Status: None   Collection Time: 02/18/19  1:05 PM   Specimen: Nasal Mucosa; Nasopharyngeal  Result Value Ref Range Status   MRSA by PCR NEGATIVE NEGATIVE Final    Comment:        The GeneXpert MRSA Assay (  FDA approved for NASAL specimens only), is one component of a comprehensive MRSA colonization surveillance program. It is not intended to diagnose MRSA infection nor to guide or monitor treatment for MRSA infections. Performed at [facility], [place] [address] [place],  [address]          Radiology Studies: Dg Chest Port 1 View  Result Date: 02/20/2019 CLINICAL DATA:  Worsening shortness of breath, COVID-19 positive, history prostate cancer EXAM: PORTABLE CHEST 1 VIEW COMPARISON:  Portable exam 1046 hours compared to 02/17/2019 FINDINGS: Stable heart size mediastinal contours. Extensive BILATERAL hours space infiltrates progressive since previous study, least severe in LEFT upper lobe. No pleural effusion or pneumothorax. IMPRESSION: Progressive BILATERAL airspace infiltrates consistent with COVID-19. Electronically Signed   By: Lavonia Dana M.D.   On: 02/20/2019 11:16        Scheduled Meds: . amLODipine  5 mg Oral Daily  . chlorhexidine  15 mL Mouth Rinse BID  . Chlorhexidine Gluconate Cloth  6 each Topical Q0600  . cholecalciferol  2,000 Units Oral Daily  . enoxaparin (LOVENOX) injection  40 mg Subcutaneous Q12H  . feeding supplement  1 Container Oral TID WC  . fluticasone  1 spray Each Nare Daily  . hydrochlorothiazide  12.5 mg Oral Daily  . insulin aspart  0-9 Units Subcutaneous TID WC  . ipratropium  2 puff Inhalation Q6H  . levalbuterol  2 puff Inhalation Q6H  . loratadine  10 mg Oral Daily  . mouth rinse  15 mL Mouth Rinse q12n4p  . metFORMIN  500 mg Oral Q breakfast  . methylPREDNISolone (SOLU-MEDROL) injection  60 mg Intravenous TID  . multivitamin with minerals  1 tablet Oral  Daily  . pantoprazole  40 mg Oral BID AC  . vitamin C  1,000 mg Oral Daily  . zinc sulfate  220 mg Oral Daily   Continuous Infusions:    LOS: 5 days   The patient is critically ill with multiple organ systems failure and requires high complexity decision making for assessment and support, frequent evaluation and titration of therapies, application of advanced monitoring technologies and extensive interpretation of multiple databases. Critical Care Time devoted to patient care services described in this note  Time spent: 40 minutes     Darcus Edds, Geraldo Docker, MD [facility] Pager [phone]  If 7PM-7AM, please contact night-coverage [email or web address] Password [email or web address] 02/22/2019, 8:07 AM

## 2019-02-22 NOTE — Progress Notes (Signed)
Patient's daughter Belenda Cruise brought items from home to keep patient occupied - including an ipad, an ipad keyboard and a Games developer, 3 books and multiple magazines. They were given to the patient in his room 202 bed 3.

## 2019-02-22 NOTE — Progress Notes (Signed)
Patients daughter updated via telephone.  

## 2019-02-22 NOTE — Progress Notes (Signed)
NAME:  Ronald Arnold, MRN:  [id number], DOB:  [birth year], LOS: 5 ADMISSION DATE:  02/17/2019, CONSULTATION DATE:  02/18/19 REFERRING MD:  Florene Glen, CHIEF COMPLAINT:  Dyspnea  Brief History   63 y/o male admitted for COVID 19 pneumonia causing acute respiratory failure with hypxoemia on 8/1.  Initially admitted on [facility], transferred to [facility] on 8/5 when a bed was available.    Past Medical History  Prostate cancer s/p prostatectomy 2015 DVT 2015  Significant Hospital Events   8/1 admission 8/2 placed in ICU, heated high flow, prone positioning 8/5 moved to [facility] ICU  Consults:  PCCM  Procedures:    Significant Diagnostic Tests:    Micro Data:  7/27 SARS COV2 positive BC x 2 8/1 >> ng  MRSA screen 8/2 neg  Antimicrobials/COVID treatment:  8/1 decadron >  8/1 tocilizumab >  8/1 remdesivir > 8/5  Interim history/subjective:   Diuresed Feels better  Objective   Blood pressure (!) 150/94, pulse (!) 111, temperature 98.2 F (36.8 C), temperature source Oral, resp. rate 19, height 5\' 10"  (1.778 m), weight 97.5 kg, SpO2 (!) 85 %.    FiO2 (%):  [100 %] 100 %   Intake/Output Summary (Last 24 hours) at 02/22/2019 0726 Last data filed at 02/21/2019 2300 Gross per 24 hour  Intake 490.17 ml  Output 2200 ml  Net -1709.83 ml   Filed Weights   02/17/19 1058 02/17/19 1124 02/19/19 0500  Weight: 93 kg 93 kg 97.5 kg    Examination:  General:  Resting comfortably in bed HENT: NCAT OP clear PULM: Crackles, wheezes bases B, normal effort CV: RRR, no mgr GI: BS+, soft, nontender MSK: normal bulk and tone Neuro: awake, alert, no distress, MAEW  August 4 chest x-ray shows diffuse bilateral airspace disease right greater than left, progressive compared to July 28 study  Resolved Hospital Problem list     Assessment & Plan:  COVID-19 pneumonia causing ARDS: Continue to wean off heated high flow Continue to monitor in ICU closely as high risk for intubation/respiratory failure Plan  decadron 10 days Plan remdesivir 5 days Tolerate periods of hypoxemia, goal at rest is greater than 85% SaO2, with movement ideally above 75% Decision for intubation should be based on a change in mental status or physical evidence of ventilatory failure such as nasal flaring, accessory muscle use, paradoxical breathing Out of bed to chair as able Incentive spirometry is important, use every hour Prone positioning while in bed   Best practice:  Diet: regular diet Pain/Anxiety/Delirium protocol (if indicated): n/a VAP protocol (if indicated): n/a DVT prophylaxis: lovenox bid per COVID protocol GI prophylaxis: n/a Glucose control: monitor, per TRH Mobility: out of bed to chair today Code Status: full Family Communication: none bedside, will discuss with TRH Disposition: remain in ICU  Labs   CBC: Recent Labs  Lab 02/15/19 1141 02/17/19 1112 02/18/19 0502 02/19/19 0151 02/20/19 0225 02/21/19 0228  WBC 7.9 7.5 7.8 7.1 7.0 6.8  NEUTROABS 6.4 6.6  --  6.4 6.0 5.7  HGB 15.2 13.8 15.7 15.9 15.1 15.9  HCT 46.6 40.5 49.0 48.6 45.5 47.9  MCV 94.0 92.9 95.1 93.6 93.4 93.0  PLT 167 160 208 258 239 938    Basic Metabolic Panel: Recent Labs  Lab 02/17/19 1112 02/18/19 0502 02/19/19 0151 02/20/19 0225 02/21/19 0228  NA 133* 138 141 138 136  K 3.6 4.0 4.2 4.0 4.2  CL 97* 103 102 101 100  CO2 24 24 26 26 26   GLUCOSE  179* 133* 137* 150* 143*  BUN 18 18 23  27* 29*  CREATININE 0.93 0.85 0.85 0.86 0.90  CALCIUM 8.0* 8.1* 8.2* 8.1* 8.4*  MG  --   --  2.1 2.0 2.2   GFR: Estimated Creatinine Clearance: 100.9 mL/min (by C-G formula based on SCr of 0.9 mg/dL). Recent Labs  Lab 02/17/19 1112 02/17/19 1714 02/18/19 0502 02/19/19 0151 02/20/19 0225 02/21/19 0228  PROCALCITON 0.33  --   --   --   --   --   WBC 7.5  --  7.8 7.1 7.0 6.8  LATICACIDVEN 1.8 2.1*  --   --   --   --     Liver Function Tests: Recent Labs  Lab 02/17/19 1112 02/18/19 0502 02/19/19 0151 02/20/19  0225 02/21/19 0228  AST 60* 67* 68* 68* 73*  ALT 42 47* 51* 60* 72*  ALKPHOS 34* 42 45 44 48  BILITOT 0.6 0.6 0.4 0.7 0.6  PROT 6.5 7.0 6.8 6.3* 6.7  ALBUMIN 3.0* 3.0* 3.0* 2.8* 3.0*   No results for input(s): LIPASE, AMYLASE in the last 168 hours. No results for input(s): AMMONIA in the last 168 hours.  ABG    Component Value Date/Time   PHART 7.467 (H) 02/17/2019 2340   PCO2ART 34.8 02/17/2019 2340   PO2ART 78.8 (L) 02/17/2019 2340   HCO3 24.1 02/17/2019 2340   TCO2 26 07/04/2014 1527   O2SAT 94.0 02/17/2019 2340     Coagulation Profile: No results for input(s): INR, PROTIME in the last 168 hours.  Cardiac Enzymes: No results for input(s): CKTOTAL, CKMB, CKMBINDEX, TROPONINI in the last 168 hours.  HbA1C: Hgb A1c MFr Bld  Date/Time Value Ref Range Status  02/20/2019 02:25 AM 6.2 (H) 4.8 - 5.6 % Final    Comment:    (NOTE) Pre diabetes:          5.7%-6.4% Diabetes:              >6.4% Glycemic control for   <7.0% adults with diabetes     CBG: Recent Labs  Lab 02/20/19 1745 02/20/19 2122 02/21/19 0754 02/21/19 1131 02/21/19 1650  GLUCAP 164* 210* 107* 119* 157*     Critical care time: 30 minutes    Roselie Awkward, MD [facility] Pager: [phone] Cell: [phone] If no response, call [phone]

## 2019-02-22 NOTE — Progress Notes (Signed)
There is a brown paper bag of belongings that were brought from patient's family. Inside the bag were a computer, charger cable and three books.

## 2019-02-23 DIAGNOSIS — R945 Abnormal results of liver function studies: Secondary | ICD-10-CM

## 2019-02-23 LAB — GLUCOSE, CAPILLARY
Glucose-Capillary: 146 mg/dL — ABNORMAL HIGH (ref 70–99)
Glucose-Capillary: 120 mg/dL — ABNORMAL HIGH (ref 70–99)
Glucose-Capillary: 114 mg/dL — ABNORMAL HIGH (ref 70–99)
Glucose-Capillary: 189 mg/dL — ABNORMAL HIGH (ref 70–99)
Glucose-Capillary: 272 mg/dL — ABNORMAL HIGH (ref 70–99)
Glucose-Capillary: 224 mg/dL — ABNORMAL HIGH (ref 70–99)

## 2019-02-23 LAB — COMPREHENSIVE METABOLIC PANEL
ALT: 71 U/L — ABNORMAL HIGH (ref 0–44)
AST: 51 U/L — ABNORMAL HIGH (ref 15–41)
Albumin: 3.2 g/dL — ABNORMAL LOW (ref 3.5–5.0)
Alkaline Phosphatase: 48 U/L (ref 38–126)
Anion gap: 10 (ref 5–15)
BUN: 34 mg/dL — ABNORMAL HIGH (ref 8–23)
CO2: 25 mmol/L (ref 22–32)
Calcium: 8.3 mg/dL — ABNORMAL LOW (ref 8.9–10.3)
Chloride: 99 mmol/L (ref 98–111)
Creatinine, Ser: 0.91 mg/dL (ref 0.61–1.24)
GFR calc Af Amer: >60 mL/min (ref >60)
GFR calc non Af Amer: >60 mL/min (ref >60)
Glucose, Bld: 164 mg/dL — ABNORMAL HIGH (ref 70–99)
Potassium: 4.1 mmol/L (ref 3.5–5.1)
Sodium: 134 mmol/L — ABNORMAL LOW (ref 135–145)
Total Bilirubin: 0.9 mg/dL (ref 0.3–1.2)
Total Protein: 6.4 g/dL — ABNORMAL LOW (ref 6.5–8.1)

## 2019-02-23 LAB — CBC WITH DIFFERENTIAL/PLATELET
Abs Immature Granulocytes: 0.38 10*3/uL — ABNORMAL HIGH (ref 0.00–0.07)
Basophils Absolute: 0.1 10*3/uL (ref 0.0–0.1)
Basophils Relative: 1 %
Eosinophils Absolute: 0 10*3/uL (ref 0.0–0.5)
Eosinophils Relative: 0 %
HCT: 45.2 % (ref 39.0–52.0)
Hemoglobin: 15.3 g/dL (ref 13.0–17.0)
Immature Granulocytes: 5 %
Lymphocytes Relative: 10 %
Lymphs Abs: 0.8 10*3/uL (ref 0.7–4.0)
MCH: 30.8 pg (ref 26.0–34.0)
MCHC: 33.8 g/dL (ref 30.0–36.0)
MCV: 90.9 fL (ref 80.0–100.0)
Monocytes Absolute: 0.4 10*3/uL (ref 0.1–1.0)
Monocytes Relative: 5 %
Neutro Abs: 6.8 10*3/uL (ref 1.7–7.7)
Neutrophils Relative %: 79 %
Platelets: 343 10*3/uL (ref 150–400)
RBC: 4.97 MIL/uL (ref 4.22–5.81)
RDW: 12.7 % (ref 11.5–15.5)
WBC: 8.4 10*3/uL (ref 4.0–10.5)
nRBC: 0 % (ref 0.0–0.2)

## 2019-02-23 LAB — MAGNESIUM: Magnesium: 2.2 mg/dL (ref 1.7–2.4)

## 2019-02-23 LAB — C-REACTIVE PROTEIN: CRP: 1.2 mg/dL — ABNORMAL HIGH (ref <1.0)

## 2019-02-23 LAB — PHOSPHORUS: Phosphorus: 3.8 mg/dL (ref 2.5–4.6)

## 2019-02-23 LAB — D-DIMER, QUANTITATIVE: D-Dimer, Quant: 11.52 ug/mL-FEU — ABNORMAL HIGH (ref 0.00–0.50)

## 2019-02-23 MED ORDER — FUROSEMIDE 10 MG/ML IJ SOLN
20.0000 mg | Freq: Once | INTRAMUSCULAR | Status: AC
  Administered 2019-02-23: 20 mg via INTRAVENOUS
  Filled 2019-02-23: qty 2

## 2019-02-23 NOTE — Progress Notes (Addendum)
NAME:  Ronald Arnold, MRN:  [id number], DOB:  Nov 28, 1955, LOS: 6 ADMISSION DATE:  02/17/2019, CONSULTATION DATE:  02/18/19 REFERRING MD:  Florene Glen, CHIEF COMPLAINT:  Dyspnea  Brief History   63 y/o male admitted for COVID 19 pneumonia causing acute respiratory failure with hypxoemia on 8/1.  Initially admitted on [facility], transferred to [facility] on 8/5 when a bed was available.    Past Medical History  Prostate cancer s/p prostatectomy 2015 DVT 2015  Significant Hospital Events   8/1 admission 8/2 placed in ICU, heated high flow, prone positioning 8/5 moved to [facility] ICU  Consults:  PCCM  Procedures:    Significant Diagnostic Tests:    Micro Data:  7/27 SARS COV2 positive BC x 2 8/1 >> ng  MRSA screen 8/2 neg  Antimicrobials/COVID treatment:  8/1 decadron >  8/1, 8/4 tocilizumab  8/1>8/5 remdesivir   Interim history/subjective:   Overnight, self proned. Remains severely hypoxemic with activity. Increased FIO2 to 100% and 50L. After rest, weaned to 90% and 30L   Objective   Blood pressure 133/83, pulse 77, temperature 98.1 F (36.7 C), temperature source Oral, resp. rate (!) 26, height 5\' 10"  (1.778 m), weight 97.5 kg, SpO2 (!) 89 %.    FiO2 (%):  [80 %-100 %] 100 %   Intake/Output Summary (Last 24 hours) at 02/23/2019 0750 Last data filed at 02/23/2019 0102 Gross per 24 hour  Intake 170 ml  Output 2350 ml  Net -2180 ml   Filed Weights   02/17/19 1058 02/17/19 1124 02/19/19 0500  Weight: 93 kg 93 kg 97.5 kg    Physical Exam: General: Well-appearing, no acute distress, on heated high flow HENT: [place], AT, OP clear, MMM Eyes: EOMI, no scleral icterus Respiratory: Diminished breath sounds bilaterally No crackles, wheezing or rales Cardiovascular: RRR, -M/R/G, no JVD GI: BS+, soft, nontender Extremities:-Edema,-tenderness Neuro: AAO x4, CNII-XII grossly intact Skin: Intact, no rashes or bruising Psych: Normal mood, normal affect  Resolved Hospital Problem list      Assessment & Plan:  Acute hypoxemic respiratory failure secondary to COVID-19: High risk for intubation Wean off heated high flow Maintain O2 saturations for goal SpO2 >85% Monitor closely for signs of ventilatory failure such as nasal flaring, accessory muscle use, abdominal paradoxical movements. Self-proning while in bed Continue Decadron 6 mg IV/PO daily  x 10 days Pulmonary hygiene including bronchodilator, IS and flutter valve. AVOID manual percussion. Mobilize as tolerated  Best practice:  Diet: regular diet Pain/Anxiety/Delirium protocol (if indicated): n/a VAP protocol (if indicated): n/a DVT prophylaxis: lovenox bid per COVID protocol GI prophylaxis: n/a Glucose control: monitor, per TRH Mobility: out of bed to chair today Code Status: full Family Communication: Updated wife, daughter via telephone on 8/7 Disposition: remain in ICU  Labs   CBC: Recent Labs  Lab 02/19/19 0151 02/20/19 0225 02/21/19 0228 02/22/19 0710 02/23/19 0038  WBC 7.1 7.0 6.8 5.8 8.4  NEUTROABS 6.4 6.0 5.7 4.7 6.8  HGB 15.9 15.1 15.9 16.6 15.3  HCT 48.6 45.5 47.9 50.1 45.2  MCV 93.6 93.4 93.0 90.9 90.9  PLT 258 239 247 352 725    Basic Metabolic Panel: Recent Labs  Lab 02/19/19 0151 02/20/19 0225 02/21/19 0228 02/22/19 0710 02/23/19 0038  NA 141 138 136 135 134*  K 4.2 4.0 4.2 3.8 4.1  CL 102 101 100 96* 99  CO2 26 26 26 27 25   GLUCOSE 137* 150* 143* 161* 164*  BUN 23 27* 29* 29* 34*  CREATININE 0.85 0.86 0.90  0.87 0.91  CALCIUM 8.2* 8.1* 8.4* 8.4* 8.3*  MG 2.1 2.0 2.2 2.1 2.2  PHOS  --   --   --  3.6 3.8   GFR: Estimated Creatinine Clearance: 99.8 mL/min (by C-G formula based on SCr of 0.91 mg/dL). Recent Labs  Lab 02/17/19 1112 02/17/19 1714  02/20/19 0225 02/21/19 0228 02/22/19 0710 02/23/19 0038  PROCALCITON 0.33  --   --   --   --   --   --   WBC 7.5  --    < > 7.0 6.8 5.8 8.4  LATICACIDVEN 1.8 2.1*  --   --   --   --   --    < > = values in this interval not  displayed.    Liver Function Tests: Recent Labs  Lab 02/19/19 0151 02/20/19 0225 02/21/19 0228 02/22/19 0710 02/23/19 0038  AST 68* 68* 73* 56* 51*  ALT 51* 60* 72* 72* 71*  ALKPHOS 45 44 48 51 48  BILITOT 0.4 0.7 0.6 1.0 0.9  PROT 6.8 6.3* 6.7 6.9 6.4*  ALBUMIN 3.0* 2.8* 3.0* 3.3* 3.2*   No results for input(s): LIPASE, AMYLASE in the last 168 hours. No results for input(s): AMMONIA in the last 168 hours.  ABG    Component Value Date/Time   PHART 7.467 (H) 02/17/2019 2340   PCO2ART 34.8 02/17/2019 2340   PO2ART 78.8 (L) 02/17/2019 2340   HCO3 24.1 02/17/2019 2340   TCO2 26 07/04/2014 1527   O2SAT 94.0 02/17/2019 2340     Coagulation Profile: No results for input(s): INR, PROTIME in the last 168 hours.  Cardiac Enzymes: No results for input(s): CKTOTAL, CKMB, CKMBINDEX, TROPONINI in the last 168 hours.  HbA1C: Hgb A1c MFr Bld  Date/Time Value Ref Range Status  02/20/2019 02:25 AM 6.2 (H) 4.8 - 5.6 % Final    Comment:    (NOTE) Pre diabetes:          5.7%-6.4% Diabetes:              >6.4% Glycemic control for   <7.0% adults with diabetes     CBG: Recent Labs  Lab 02/22/19 1529 02/22/19 1942 02/23/19 0102 02/23/19 0405 02/23/19 0730  GLUCAP 184* 268* 146* 120* 114*     Critical care time: 31 minutes    The patient is critically ill with multiple organ systems failure and requires high complexity decision making for assessment and support, frequent evaluation and titration of therapies, application of advanced monitoring technologies and extensive interpretation of multiple databases.   Discussed and co-managed patient care with PCCM-Hospitalist. Coordinated care with RT, RN and pharmacist.  Rodman Pickle, M.D. [facility] Pulmonary/Critical Care Medicine 02/23/2019 7:50 AM  Pager: [phone] After hours pager: [phone]

## 2019-02-23 NOTE — Progress Notes (Signed)
PROGRESS NOTE    Dorsel Flinn Bodi  GYF:[id number] DOB: 10-18-1955 DOA: 02/17/2019 PCP: Lawerance Cruel, MD   Brief Narrative:  Artavious Trebilcock Simkinsis a 63 y.o.WM PMHx prostate cancer status post surgery 5 years ago,history of DVT 2015 treated with Xarelto, HTN controlled wo meds, HLD.  Presents complaining of worsening shortness of breath. Patient currently day 10 of symptoms for COVID-19. He was diagnosed with cough with on Monday (7/27), at [facility] clinic.He presents complaining of worsening shortness of breath, cough. His oxygen dropped yesterday. He was able to be on 3 L of oxygen at home, his PCP for help arrange oxygen at home.  He denies chest pain, abdominal pain, diarrhea.   Evaluation in the ED: Patient temperature 101, oxygen saturation 92 on 2 L.Sodium 133 chloride 97, BUN 18, creatinine 1.93, calcium 8.0, albumin 3.0, AST 60, LDH 344, ferritin 711, CRP 9.6, procalcitonin 0.3 lactic acid 1.8, white blood cell 7.5, hemoglobin 13,d-dimer 0.9, fibrinogen 626. Chest x-ray:Worsened lung aeration with an increase in bilateral hazy airspace opacities consistent with multifocal pneumonia, pattern consistent with the diagnosis of COVID-19.   Subjective: 8/7 A/O x4, positive S OB, negative N/V.  States slept well last night.   Assessment & Plan:   Principal Problem:   Pneumonia due to COVID-19 virus Active Problems:   Lower respiratory tract infection due to COVID-19 virus   HTN (hypertension)   HLD (hyperlipidemia)   Acute respiratory failure with hypoxia (HCC)   Hypoxia   Prediabetes   Acute Respiratory Failure with Hypoxia, COVID Pneumonia -Dx positive COVID 19 at your outpatient clinic. - Continue HHFNC.  Titrate to maintain SPO2> 90%.  However will tolerate permissive hypoxia when patient is active and as long as patient does not have AMS. - Solu-Medrol 60 mg TID - Xopenex 3 times daily -Ipratropium 3 times daily - Remdesivir  per pharmacy protocol - 8/4 Actemra   - Vitamins per COVID protocol. - Currently patient doing well on HHFNC if O2 requirements increase prone patient for minimum of 2 -3 hours every 12 hours.  If patient can tolerate prone for 16 hours/day. - Daily COVID inflammatory markers pending Recent Labs  Lab 02/19/19 0151 02/20/19 0225 02/21/19 0228 02/22/19 0710 02/23/19 0038  CRP 9.8* 4.4* 2.7* 1.3* 1.2*   Recent Labs  Lab 02/19/19 0151 02/20/19 0225 02/21/19 0228 02/22/19 0710 02/23/19 0038  DDIMER 2.11* 10.69* 15.44* 14.83* 11.52*  -Inflammatory markers decreasing -Continue to wean O2 as patient tolerates.  SPO2 goal> 89%  Essential HTN - Per patient at home no need for BP medication. - Continue amlodipine 5 mg daily - Hydralazine PRN - Hydrochlorothiazide 12.5 mg daily  Elevated LFTs - Monitor closely Recent Labs  Lab 02/19/19 0151 02/20/19 0225 02/21/19 0228 02/22/19 0710 02/23/19 0038  AST 68* 68* 73* 56* 51*   Recent Labs  Lab 02/19/19 0151 02/20/19 0225 02/21/19 0228 02/22/19 0710 02/23/19 0038  ALT 51* 60* 72* 72* 71*  -Continue to improve  Prediabetes -8/4 hemoglobin A1c= 6.4 - Most likely exacerbated by steroids -8/5 metformin 500 mg daily - 8/6 increase moderate SSI   HLD - 8/6 lipid panel not within ADA/AHA guidelines - LDL goal<70 - Lipitor 20 mg daily  Hemoptysis -Per previous note patient was noted to have hemoptysis.  Monitor closely -Monitor H/H closely Recent Labs  Lab 02/19/19 0151 02/20/19 0225 02/21/19 0228 02/22/19 0710 02/23/19 0038  HGB 15.9 15.1 15.9 16.6 15.3  -Hemoglobin stable  Hx prostate cancer -S/p surgery 5 years ago.  DVT prophylaxis: Lovenox BID code Status:  Family Communication: . Disposition Plan: TBD   Consultants:  PCCM  Procedures/Significant Events:     I have personally reviewed and interpreted all radiology studies and my findings are as above.  VENTILATOR SETTINGS: HHFNC O2 flow rate: 30 L/min FiO2: 90% SPO2: 83%  (patient had just completed moving to have his bed stripped and changed)   Cultures 8/1 blood RIGHT hand negative final 8/1 blood left arm negative final 8/1 acute hepatitis panel negative 8/1 HIV negative 8/2 MRSA by PCR negative    Antimicrobials: Anti-infectives (From admission, onward)   Start     Stop   02/18/19 1800  remdesivir 100 mg in sodium chloride 0.9 % 250 mL IVPB     02/22/19 1759   02/17/19 1530  remdesivir 200 mg in sodium chloride 0.9 % 250 mL IVPB     02/17/19 1808       Devices    LINES / TUBES:      Continuous Infusions:    Objective: Vitals:   02/23/19 0256 02/23/19 0400 02/23/19 0500 02/23/19 0600  BP:  139/76  133/83  Pulse: 80 79  77  Resp: (!) 24 (!) 25  (!) 26  Temp:  99.4 F (37.4 C)    TempSrc:  Oral    SpO2: 91% 92%  (!) 89%  Weight:      Height:   5\' 10"  (1.778 m)     Intake/Output Summary (Last 24 hours) at 02/23/2019 3329 Last data filed at 02/22/2019 2300 Gross per 24 hour  Intake 170 ml  Output 1650 ml  Net -1480 ml   Filed Weights   02/17/19 1058 02/17/19 1124 02/19/19 0500  Weight: 93 kg 93 kg 97.5 kg   Physical Exam:  General: A/O x4, positive acute respiratory distress Eyes: negative scleral hemorrhage, negative anisocoria, negative icterus ENT: Negative Runny nose, negative gingival bleeding, Neck:  Negative scars, masses, torticollis, lymphadenopathy, JVD Lungs: Clear to auscultation bilaterally without wheezes or crackles Cardiovascular: Regular rate and rhythm without murmur gallop or rub normal S1 and S2 Abdomen: negative abdominal pain, nondistended, positive soft, bowel sounds, no rebound, no ascites, no appreciable mass Extremities: No significant cyanosis, clubbing, or edema bilateral lower extremities Skin: Negative rashes, lesions, ulcers Psychiatric:  Negative depression, negative anxiety, negative fatigue, negative mania  Central nervous system:  Cranial nerves II through XII intact, tongue/uvula  midline, all extremities muscle strength 5/5, sensation intact throughout,negative dysarthria, negative expressive aphasia, negative receptive aphasia.        Data Reviewed: Care during the described time interval was provided by me .  I have reviewed this patient's available data, including medical history, events of note, physical examination, and all test results as part of my evaluation.   CBC: Recent Labs  Lab 02/19/19 0151 02/20/19 0225 02/21/19 0228 02/22/19 0710 02/23/19 0038  WBC 7.1 7.0 6.8 5.8 8.4  NEUTROABS 6.4 6.0 5.7 4.7 6.8  HGB 15.9 15.1 15.9 16.6 15.3  HCT 48.6 45.5 47.9 50.1 45.2  MCV 93.6 93.4 93.0 90.9 90.9  PLT 258 239 247 352 518   Basic Metabolic Panel: Recent Labs  Lab 02/19/19 0151 02/20/19 0225 02/21/19 0228 02/22/19 0710 02/23/19 0038  NA 141 138 136 135 134*  K 4.2 4.0 4.2 3.8 4.1  CL 102 101 100 96* 99  CO2 26 26 26 27 25   GLUCOSE 137* 150* 143* 161* 164*  BUN 23 27* 29* 29* 34*  CREATININE 0.85 0.86 0.90 0.87 0.91  CALCIUM 8.2* 8.1* 8.4* 8.4* 8.3*  MG 2.1 2.0 2.2 2.1 2.2  PHOS  --   --   --  3.6 3.8   GFR: Estimated Creatinine Clearance: 99.8 mL/min (by C-G formula based on SCr of 0.91 mg/dL). Liver Function Tests: Recent Labs  Lab 02/19/19 0151 02/20/19 0225 02/21/19 0228 02/22/19 0710 02/23/19 0038  AST 68* 68* 73* 56* 51*  ALT 51* 60* 72* 72* 71*  ALKPHOS 45 44 48 51 48  BILITOT 0.4 0.7 0.6 1.0 0.9  PROT 6.8 6.3* 6.7 6.9 6.4*  ALBUMIN 3.0* 2.8* 3.0* 3.3* 3.2*   No results for input(s): LIPASE, AMYLASE in the last 168 hours. No results for input(s): AMMONIA in the last 168 hours. Coagulation Profile: No results for input(s): INR, PROTIME in the last 168 hours. Cardiac Enzymes: No results for input(s): CKTOTAL, CKMB, CKMBINDEX, TROPONINI in the last 168 hours. BNP (last 3 results) No results for input(s): PROBNP in the last 8760 hours. HbA1C: No results for input(s): HGBA1C in the last 72 hours. CBG: Recent Labs   Lab 02/22/19 1221 02/22/19 1529 02/22/19 1942 02/23/19 0102 02/23/19 0405  GLUCAP 239* 184* 268* 146* 120*   Lipid Profile: Recent Labs    02/21/19 0228 02/22/19 0710  CHOL  --  150  HDL  --  24*  LDLCALC  --  95  TRIG 151* 155*  CHOLHDL  --  6.3   Thyroid Function Tests: No results for input(s): TSH, T4TOTAL, FREET4, T3FREE, THYROIDAB in the last 72 hours. Anemia Panel: Recent Labs    02/21/19 0228 02/22/19 0710  FERRITIN 1,032* 1,021*   Urine analysis: No results found for: COLORURINE, APPEARANCEUR, LABSPEC, PHURINE, GLUCOSEU, HGBUR, BILIRUBINUR, KETONESUR, PROTEINUR, UROBILINOGEN, NITRITE, LEUKOCYTESUR Sepsis Labs: @LABRCNTIP (procalcitonin:4,lacticidven:4)  ) Recent Results (from the past 240 hour(s))  Blood Culture (routine x 2)     Status: None   Collection Time: 02/17/19 12:30 PM   Specimen: BLOOD RIGHT HAND  Result Value Ref Range Status   Specimen Description   Final    BLOOD RIGHT HAND Performed at [facility] [address], [place], [place] [address]    Special Requests   Final    BOTTLES DRAWN AEROBIC AND ANAEROBIC Blood Culture adequate volume Performed at [facility] [address], [place], [place] [address]    Culture   Final    NO GROWTH 5 DAYS Performed at [facility], [place] [address], [place], [place] [address]    Report Status 02/22/2019 FINAL  Final  Blood Culture (routine x 2)     Status: None   Collection Time: 02/17/19  5:14 PM   Specimen: BLOOD LEFT ARM  Result Value Ref Range Status   Specimen Description   Final    BLOOD LEFT ARM Performed at [facility], [place] [address] [place], [place] [address]    Special Requests   Final    BOTTLES DRAWN AEROBIC AND ANAEROBIC Blood Culture results may not be optimal due to an excessive volume of blood received in culture bottles Performed at [facility] [address] [place], [place] [address]    Culture   Final     NO GROWTH 5 DAYS Performed at [facility], [place] [address], [place], [place] [address]    Report Status 02/22/2019 FINAL  Final  MRSA PCR Screening     Status: None   Collection Time: 02/18/19  1:05 PM   Specimen: Nasal Mucosa; Nasopharyngeal  Result Value Ref Range Status   MRSA by PCR NEGATIVE NEGATIVE Final    Comment:        The GeneXpert MRSA Assay (FDA approved for NASAL specimens only), is one component of a comprehensive MRSA colonization surveillance program. It is not intended to diagnose MRSA infection nor to guide or monitor treatment for MRSA infections. Performed at [facility], [place] [address], [place], [place] [address]          Radiology Studies: No results found.      Scheduled Meds: . amLODipine  5 mg Oral Daily  . atorvastatin  20 mg Oral q1800  . Chlorhexidine Gluconate Cloth  6 each Topical Q0600  . cholecalciferol  2,000 Units Oral Daily  . enoxaparin (LOVENOX) injection  40 mg Subcutaneous Q12H  . feeding supplement  1 Container Oral TID WC  . fluticasone  1 spray Each Nare Daily  . hydrochlorothiazide  12.5 mg Oral Daily  . insulin aspart  0-15 Units Subcutaneous Q4H  . ipratropium  2 puff Inhalation Q6H  . levalbuterol  2 puff Inhalation Q6H  . loratadine  10 mg Oral Daily  . mouth rinse  15 mL Mouth Rinse BID  . metFORMIN  500 mg Oral Q breakfast  . methylPREDNISolone (SOLU-MEDROL) injection  40 mg Intravenous Q24H  . multivitamin with minerals  1 tablet Oral Daily  . oxymetazoline  1 spray Each Nare BID  . pantoprazole  40 mg Oral BID AC  . traZODone  50 mg Oral QHS  . vitamin C  1,000 mg Oral Daily  . zinc sulfate  220 mg Oral Daily   Continuous Infusions:    LOS: 6 days   The patient is critically ill with multiple organ systems failure and requires high complexity decision making for assessment and support, frequent evaluation and titration of therapies, application of advanced monitoring  technologies and extensive interpretation of multiple databases. Critical Care Time devoted to patient care services described in this note  Time spent: 40 minutes     Damante Spragg, Geraldo Docker, MD [facility] Pager [phone]  If 7PM-7AM, please contact night-coverage [email or web address] Password [email or web address] 02/23/2019, 7:18 AM

## 2019-02-23 NOTE — Progress Notes (Signed)
Belenda Cruise (daughter) called with pt updates.  All questions answered at this time.

## 2019-02-23 NOTE — Progress Notes (Signed)
0615 Pt sleeping prone since 2330. Patient ready for bath, patient turned over, sitting up in bed O2 sats in the 70s, pt not in acute distress. Pt laid back and rolled to change soiled linen from under him and O2 sats dropped in 60s. After about 5 minutes, oxygen increase from 30L and 90% to 40L & 90%. Pt tachypneic and coughing. Still slow to recover after about another 5 minutes of O2 sats being in 60s-70s, oxygen increased to 50L & 100%. Oxygen sats in the low 80s. Respiratory therapy called for patient. Pt's earlobes and fingertips becoming cyanotic.   [phone] patient now low 80s on 50L and 100%, respiratory at bedside, stated it will take longer for patient to recover but O2 sats moving in right direction. Respiratory stated they will wean his oxygen back down throughout the day, but let him rest on the current settings. Pt resting in bed, WCTM.

## 2019-02-24 LAB — COMPREHENSIVE METABOLIC PANEL
ALT: 85 U/L — ABNORMAL HIGH (ref 0–44)
AST: 52 U/L — ABNORMAL HIGH (ref 15–41)
Albumin: 3.5 g/dL (ref 3.5–5.0)
Alkaline Phosphatase: 51 U/L (ref 38–126)
Anion gap: 14 (ref 5–15)
BUN: 33 mg/dL — ABNORMAL HIGH (ref 8–23)
CO2: 26 mmol/L (ref 22–32)
Calcium: 8.7 mg/dL — ABNORMAL LOW (ref 8.9–10.3)
Chloride: 93 mmol/L — ABNORMAL LOW (ref 98–111)
Creatinine, Ser: 0.99 mg/dL (ref 0.61–1.24)
GFR calc Af Amer: >60 mL/min (ref >60)
GFR calc non Af Amer: >60 mL/min (ref >60)
Glucose, Bld: 127 mg/dL — ABNORMAL HIGH (ref 70–99)
Potassium: 3.9 mmol/L (ref 3.5–5.1)
Sodium: 133 mmol/L — ABNORMAL LOW (ref 135–145)
Total Bilirubin: 1 mg/dL (ref 0.3–1.2)
Total Protein: 7 g/dL (ref 6.5–8.1)

## 2019-02-24 LAB — CBC WITH DIFFERENTIAL/PLATELET
Abs Immature Granulocytes: 0.5 10*3/uL — ABNORMAL HIGH (ref 0.00–0.07)
Basophils Absolute: 0 10*3/uL (ref 0.0–0.1)
Basophils Relative: 0 %
Eosinophils Absolute: 0 10*3/uL (ref 0.0–0.5)
Eosinophils Relative: 0 %
HCT: 49 % (ref 39.0–52.0)
Hemoglobin: 16.6 g/dL (ref 13.0–17.0)
Immature Granulocytes: 5 %
Lymphocytes Relative: 8 %
Lymphs Abs: 0.9 10*3/uL (ref 0.7–4.0)
MCH: 31 pg (ref 26.0–34.0)
MCHC: 33.9 g/dL (ref 30.0–36.0)
MCV: 91.6 fL (ref 80.0–100.0)
Monocytes Absolute: 0.3 10*3/uL (ref 0.1–1.0)
Monocytes Relative: 3 %
Neutro Abs: 9 10*3/uL — ABNORMAL HIGH (ref 1.7–7.7)
Neutrophils Relative %: 84 %
Platelets: 393 10*3/uL (ref 150–400)
RBC: 5.35 MIL/uL (ref 4.22–5.81)
RDW: 12.8 % (ref 11.5–15.5)
WBC: 10.8 10*3/uL — ABNORMAL HIGH (ref 4.0–10.5)
nRBC: 0.2 % (ref 0.0–0.2)

## 2019-02-24 LAB — MAGNESIUM: Magnesium: 2.1 mg/dL (ref 1.7–2.4)

## 2019-02-24 LAB — GLUCOSE, CAPILLARY
Glucose-Capillary: 128 mg/dL — ABNORMAL HIGH (ref 70–99)
Glucose-Capillary: 171 mg/dL — ABNORMAL HIGH (ref 70–99)
Glucose-Capillary: 179 mg/dL — ABNORMAL HIGH (ref 70–99)
Glucose-Capillary: 215 mg/dL — ABNORMAL HIGH (ref 70–99)
Glucose-Capillary: 82 mg/dL (ref 70–99)
Glucose-Capillary: 99 mg/dL (ref 70–99)

## 2019-02-24 LAB — D-DIMER, QUANTITATIVE: D-Dimer, Quant: 13.6 ug/mL-FEU — ABNORMAL HIGH (ref 0.00–0.50)

## 2019-02-24 LAB — C-REACTIVE PROTEIN: CRP: 1 mg/dL — ABNORMAL HIGH (ref <1.0)

## 2019-02-24 LAB — PHOSPHORUS: Phosphorus: 3.8 mg/dL (ref 2.5–4.6)

## 2019-02-24 MED ORDER — DEXAMETHASONE 6 MG PO TABS
6.0000 mg | ORAL_TABLET | Freq: Every day | ORAL | Status: AC
  Administered 2019-02-24 – 2019-03-02 (×7): 6 mg via ORAL
  Filled 2019-02-24 (×8): qty 1

## 2019-02-24 MED ORDER — FUROSEMIDE 40 MG PO TABS
60.0000 mg | ORAL_TABLET | Freq: Once | ORAL | Status: AC
  Administered 2019-02-24: 60 mg via ORAL
  Filled 2019-02-24: qty 2

## 2019-02-24 NOTE — Progress Notes (Signed)
Pt A&Ox4, VSS except Desats on HFNC with minimal movement in bed. Pt has given permission for nurse to speak to daughter Belenda Cruise for updates. Belenda Cruise updated on pt current status and POC. No additional needs at this time. See new Md. Orders. Will continue to monitor.

## 2019-02-24 NOTE — Progress Notes (Signed)
[facility]  Updated daughter Belenda Cruise on patient's clinical status. Remains on heated high flow. Stay in ICU. Addressed questions.  Rodman Pickle, M.D. [facility] Pulmonary/Critical Care Medicine 02/24/2019 2:26 PM

## 2019-02-24 NOTE — Plan of Care (Signed)
Pt remains on HFNC/ oxymizer, with increased O2 needs with minimal movement.

## 2019-02-24 NOTE — Progress Notes (Signed)
NAME:  Ronald Arnold, MRN:  [id number], DOB:  05-27-1956, LOS: 7 ADMISSION DATE:  02/17/2019, CONSULTATION DATE:  02/18/19 REFERRING MD:  Florene Glen, CHIEF COMPLAINT:  Dyspnea  Brief History   63 y/o male admitted for COVID 19 pneumonia causing acute respiratory failure with hypxoemia on 8/1.  Initially admitted on [facility], transferred to [facility] on 8/5 when a bed was available.    Past Medical History  Prostate cancer s/p prostatectomy 2015 DVT 2015  Significant Hospital Events   8/1 admission 8/2 placed in ICU, heated high flow, prone positioning 8/5 moved to [facility] ICU  Consults:  PCCM  Procedures:    Significant Diagnostic Tests:    Micro Data:  7/27 SARS COV2 positive BC x 2 8/1 >> ng  MRSA screen 8/2 neg  Antimicrobials/COVID treatment:  8/1 decadron >  8/1, 8/4 tocilizumab  8/1>8/5 remdesivir   Interim history/subjective:   Desats with activity. Weaned to FIO2 60% however desatted this morning. Takes 30-35 minutes to recovery saturations. Currently on 90% and 30L.  Objective   Blood pressure 105/78, pulse (!) 105, temperature 98.3 F (36.8 C), temperature source Oral, resp. rate (!) 23, height 5\' 10"  (1.778 m), weight 93.2 kg, SpO2 (!) 84 %.    FiO2 (%):  [60 %-90 %] 90 %   Intake/Output Summary (Last 24 hours) at 02/24/2019 1227 Last data filed at 02/24/2019 1000 Gross per 24 hour  Intake 730 ml  Output 1775 ml  Net -1045 ml   Filed Weights   02/17/19 1124 02/19/19 0500 02/24/19 0447  Weight: 93 kg 97.5 kg 93.2 kg   Physical Exam: General: Well-appearing, no acute distress, on heated high flow HENT: [place], AT, OP clear, MMM Eyes: EOMI, no scleral icterus Respiratory: Diminished breath sounds bilaterally.  No crackles, wheezing or rales Cardiovascular: RRR, -M/R/G, no JVD GI: BS+, soft, nontender Extremities:-Edema,-tenderness Neuro: AAO x4, CNII-XII grossly intact Skin: Intact, no rashes or bruising Psych: Normal mood, normal affect  Resolved Hospital Problem  list     Assessment & Plan:  Acute hypoxemic respiratory failure secondary to COVID-19: High risk for intubation Wean off heated high flow Maintain O2 saturations for goal SpO2 >85% Monitor closely for signs of ventilatory failure such as nasal flaring, accessory muscle use, abdominal paradoxical movements. Self-proning while in bed Continue Decadron 6 mg IV/PO daily  x 10 days  Pulmonary hygiene including bronchodilator, IS and flutter valve. AVOID manual percussion. Mobilize as tolerated PO lasix 60 mg now Hold anti-hypertensive agents to optimize volume removal  Best practice:  Diet: regular diet Pain/Anxiety/Delirium protocol (if indicated): n/a VAP protocol (if indicated): n/a DVT prophylaxis: lovenox bid per COVID protocol GI prophylaxis: n/a Glucose control: monitor, per TRH Mobility: out of bed to chair today Code Status: full Family Communication: Updated wife, daughter via telephone on 8/7. No answer on 8/8. Disposition: remain in ICU  Labs   CBC: Recent Labs  Lab 02/20/19 0225 02/21/19 0228 02/22/19 0710 02/23/19 0038 02/24/19 0055  WBC 7.0 6.8 5.8 8.4 10.8*  NEUTROABS 6.0 5.7 4.7 6.8 9.0*  HGB 15.1 15.9 16.6 15.3 16.6  HCT 45.5 47.9 50.1 45.2 49.0  MCV 93.4 93.0 90.9 90.9 91.6  PLT 239 247 352 343 245    Basic Metabolic Panel: Recent Labs  Lab 02/20/19 0225 02/21/19 0228 02/22/19 0710 02/23/19 0038 02/24/19 0055  NA 138 136 135 134* 133*  K 4.0 4.2 3.8 4.1 3.9  CL 101 100 96* 99 93*  CO2 26 26 27 25  26  GLUCOSE 150* 143* 161* 164* 127*  BUN 27* 29* 29* 34* 33*  CREATININE 0.86 0.90 0.87 0.91 0.99  CALCIUM 8.1* 8.4* 8.4* 8.3* 8.7*  MG 2.0 2.2 2.1 2.2 2.1  PHOS  --   --  3.6 3.8 3.8   GFR: Estimated Creatinine Clearance: 89.9 mL/min (by C-G formula based on SCr of 0.99 mg/dL). Recent Labs  Lab 02/17/19 1714  02/21/19 0228 02/22/19 0710 02/23/19 0038 02/24/19 0055  WBC  --    < > 6.8 5.8 8.4 10.8*  LATICACIDVEN 2.1*  --   --   --   --    --    < > = values in this interval not displayed.    Liver Function Tests: Recent Labs  Lab 02/20/19 0225 02/21/19 0228 02/22/19 0710 02/23/19 0038 02/24/19 0055  AST 68* 73* 56* 51* 52*  ALT 60* 72* 72* 71* 85*  ALKPHOS 44 48 51 48 51  BILITOT 0.7 0.6 1.0 0.9 1.0  PROT 6.3* 6.7 6.9 6.4* 7.0  ALBUMIN 2.8* 3.0* 3.3* 3.2* 3.5   No results for input(s): LIPASE, AMYLASE in the last 168 hours. No results for input(s): AMMONIA in the last 168 hours.  ABG    Component Value Date/Time   PHART 7.467 (H) 02/17/2019 2340   PCO2ART 34.8 02/17/2019 2340   PO2ART 78.8 (L) 02/17/2019 2340   HCO3 24.1 02/17/2019 2340   TCO2 26 07/04/2014 1527   O2SAT 94.0 02/17/2019 2340     Coagulation Profile: No results for input(s): INR, PROTIME in the last 168 hours.  Cardiac Enzymes: No results for input(s): CKTOTAL, CKMB, CKMBINDEX, TROPONINI in the last 168 hours.  HbA1C: Hgb A1c MFr Bld  Date/Time Value Ref Range Status  02/20/2019 02:25 AM 6.2 (H) 4.8 - 5.6 % Final    Comment:    (NOTE) Pre diabetes:          5.7%-6.4% Diabetes:              >6.4% Glycemic control for   <7.0% adults with diabetes     CBG: Recent Labs  Lab 02/23/19 1550 02/23/19 1946 02/24/19 0014 02/24/19 0436 02/24/19 0850  GLUCAP 272* 224* 128* 99 82     Critical care time: 33 minutes    The patient is critically ill with multiple organ systems failure and requires high complexity decision making for assessment and support, frequent evaluation and titration of therapies, application of advanced monitoring technologies and extensive interpretation of multiple databases.   Discussed and co-managed patient care with PCCM-Hospitalist. Coordinated care with RT, RN and pharmacist.  Rodman Pickle, M.D. [facility] Pulmonary/Critical Care Medicine 02/24/2019 12:27 PM  Pager: [phone] After hours pager: [phone]

## 2019-02-24 NOTE — Progress Notes (Signed)
Assisted patient to facetime daughter and family. Answered questions from family members.

## 2019-02-24 NOTE — Progress Notes (Signed)
PROGRESS NOTE    Ronald Arnold  KCL:[id number] DOB: 08/04/1955 DOA: 02/17/2019 PCP: Lawerance Cruel, MD   Brief Narrative:  Ronald Rachel Simkinsis a 63 y.o.WM PMHx prostate cancer status post surgery 5 years ago,history of DVT 2015 treated with Xarelto, HTN controlled wo meds, HLD.  Presents complaining of worsening shortness of breath. Patient currently day 10 of symptoms for COVID-19. He was diagnosed with cough with on Monday (7/27), at [facility] clinic.He presents complaining of worsening shortness of breath, cough. His oxygen dropped yesterday. He was able to be on 3 L of oxygen at home, his PCP for help arrange oxygen at home.  He denies chest pain, abdominal pain, diarrhea.   Evaluation in the ED: Patient temperature 101, oxygen saturation 92 on 2 L.Sodium 133 chloride 97, BUN 18, creatinine 1.93, calcium 8.0, albumin 3.0, AST 60, LDH 344, ferritin 711, CRP 9.6, procalcitonin 0.3 lactic acid 1.8, white blood cell 7.5, hemoglobin 13,d-dimer 0.9, fibrinogen 626. Chest x-ray:Worsened lung aeration with an increase in bilateral hazy airspace opacities consistent with multifocal pneumonia, pattern consistent with the diagnosis of COVID-19.   Subjective: 8/8 A/O x4, positive S OB, patient continues to do well when sitting still, after exertion has extended recovery period.  Negative N/V.    Assessment & Plan:   Principal Problem:   Pneumonia due to COVID-19 virus Active Problems:   Lower respiratory tract infection due to COVID-19 virus   HTN (hypertension)   HLD (hyperlipidemia)   Acute respiratory failure with hypoxia (HCC)   Hypoxia   Prediabetes   Acute Respiratory Failure with Hypoxia, COVID Pneumonia -Dx positive COVID 19 at your outpatient clinic. - Continue HHFNC.  Titrate to maintain SPO2> 90%.  However will tolerate permissive hypoxia when patient is active and as long as patient does not have AMS. - 8/8 Solu-Medrol 60 mg TID--> Decadron 6 mg daily - Xopenex 3  times daily -Ipratropium 3 times daily - Remdesivir  per pharmacy protocol - 8/4 Actemra  - Vitamins per COVID protocol. - Currently patient doing well on HHFNC if O2 requirements increase prone patient for minimum of 2 -3 hours every 12 hours.  If patient can tolerate prone for 16 hours/day. - Daily COVID inflammatory markers pending Recent Labs  Lab 02/20/19 0225 02/21/19 0228 02/22/19 0710 02/23/19 0038 02/24/19 0055  CRP 4.4* 2.7* 1.3* 1.2* 1.0*   Recent Labs  Lab 02/20/19 0225 02/21/19 0228 02/22/19 0710 02/23/19 0038 02/24/19 0055  DDIMER 10.69* 15.44* 14.83* 11.52* 13.60*  -Inflammatory markers decreasing -Continue to wean O2 as patient tolerates.  SPO2 goal> 89%  Essential HTN - Per patient at home no need for BP medication. - Continue amlodipine 5 mg daily(HOLD) - Hydralazine PRN(HOLD) - Hydrochlorothiazide 12.5 mg daily(HOLD) - Strict in and out -10.9 L -Daily weight Filed Weights   02/17/19 1124 02/19/19 0500 02/24/19 0447  Weight: 93 kg 97.5 kg 93.2 kg  -Continue diuresing patient.  Monitor electrolytes and renal function closely - Lasix p.o. 60 mg  Elevated LFTs - Monitor closely Recent Labs  Lab 02/20/19 0225 02/21/19 0228 02/22/19 0710 02/23/19 0038 02/24/19 0055  AST 68* 73* 56* 51* 52*   Recent Labs  Lab 02/20/19 0225 02/21/19 0228 02/22/19 0710 02/23/19 0038 02/24/19 0055  ALT 60* 72* 72* 71* 85*  -Continue to improve  Prediabetes -8/4 hemoglobin A1c= 6.4 - Most likely exacerbated by steroids -8/5 metformin 500 mg daily - 8/6 increase moderate SSI   HLD - 8/6 lipid panel not within ADA/AHA guidelines -  LDL goal<70 - Lipitor 20 mg daily  Hemoptysis -Per previous note patient was noted to have hemoptysis.  Monitor closely -Monitor H/H closely Recent Labs  Lab 02/20/19 0225 02/21/19 0228 02/22/19 0710 02/23/19 0038 02/24/19 0055  HGB 15.1 15.9 16.6 15.3 16.6  -Hemoglobin stable  Hx prostate cancer -S/p surgery 5  years ago.    DVT prophylaxis: Lovenox BID code Status: Full Family Communication: . Disposition Plan: TBD   Consultants:  PCCM  Procedures/Significant Events:     I have personally reviewed and interpreted all radiology studies and my findings are as above.  VENTILATOR SETTINGS: HFNC O2 flow rate: 30 L/min FiO2: 90% SPO2:    Cultures 8/1 blood RIGHT hand negative final 8/1 blood left arm negative final 8/1 acute hepatitis panel negative 8/1 HIV negative 8/2 MRSA by PCR negative    Antimicrobials: Anti-infectives (From admission, onward)   Start     Stop   02/18/19 1800  remdesivir 100 mg in sodium chloride 0.9 % 250 mL IVPB     02/22/19 1759   02/17/19 1530  remdesivir 200 mg in sodium chloride 0.9 % 250 mL IVPB     02/17/19 1808       Devices    LINES / TUBES:      Continuous Infusions:    Objective: Vitals:   02/24/19 0258 02/24/19 0400 02/24/19 0447 02/24/19 0558  BP:  96/85    Pulse: 91 87  89  Resp: (!) 21 (!) 27  (!) 23  Temp:  98.4 F (36.9 C)    TempSrc:  Oral    SpO2: 93% 91%  92%  Weight:   93.2 kg   Height:        Intake/Output Summary (Last 24 hours) at 02/24/2019 0962 Last data filed at 02/24/2019 0000 Gross per 24 hour  Intake 250 ml  Output 3250 ml  Net -3000 ml   Filed Weights   02/17/19 1124 02/19/19 0500 02/24/19 0447  Weight: 93 kg 97.5 kg 93.2 kg   Physical Exam:  General: A/O x4, positive acute respiratory distress Eyes: negative scleral hemorrhage, negative anisocoria, negative icterus ENT: Negative Runny nose, negative gingival bleeding, Neck:  Negative scars, masses, torticollis, lymphadenopathy, JVD Lungs: Clear to auscultation bilaterally without wheezes or crackles Cardiovascular: Regular rate and rhythm without murmur gallop or rub normal S1 and S2 Abdomen: negative abdominal pain, nondistended, positive soft, bowel sounds, no rebound, no ascites, no appreciable mass Extremities: No significant  cyanosis, clubbing, or edema bilateral lower extremities Skin: Negative rashes, lesions, ulcers Psychiatric:  Negative depression, negative anxiety, negative fatigue, negative mania  Central nervous system:  Cranial nerves II through XII intact, tongue/uvula midline, all extremities muscle strength 5/5, sensation intact throughout, , negative dysarthria, negative expressive aphasia, negative receptive aphasia.        Data Reviewed: Care during the described time interval was provided by me .  I have reviewed this patient's available data, including medical history, events of note, physical examination, and all test results as part of my evaluation.   CBC: Recent Labs  Lab 02/20/19 0225 02/21/19 0228 02/22/19 0710 02/23/19 0038 02/24/19 0055  WBC 7.0 6.8 5.8 8.4 10.8*  NEUTROABS 6.0 5.7 4.7 6.8 9.0*  HGB 15.1 15.9 16.6 15.3 16.6  HCT 45.5 47.9 50.1 45.2 49.0  MCV 93.4 93.0 90.9 90.9 91.6  PLT 239 247 352 343 836   Basic Metabolic Panel: Recent Labs  Lab 02/20/19 0225 02/21/19 0228 02/22/19 0710 02/23/19 0038 02/24/19 0055  NA  138 136 135 134* 133*  K 4.0 4.2 3.8 4.1 3.9  CL 101 100 96* 99 93*  CO2 26 26 27 25 26   GLUCOSE 150* 143* 161* 164* 127*  BUN 27* 29* 29* 34* 33*  CREATININE 0.86 0.90 0.87 0.91 0.99  CALCIUM 8.1* 8.4* 8.4* 8.3* 8.7*  MG 2.0 2.2 2.1 2.2 2.1  PHOS  --   --  3.6 3.8 3.8   GFR: Estimated Creatinine Clearance: 89.9 mL/min (by C-G formula based on SCr of 0.99 mg/dL). Liver Function Tests: Recent Labs  Lab 02/20/19 0225 02/21/19 0228 02/22/19 0710 02/23/19 0038 02/24/19 0055  AST 68* 73* 56* 51* 52*  ALT 60* 72* 72* 71* 85*  ALKPHOS 44 48 51 48 51  BILITOT 0.7 0.6 1.0 0.9 1.0  PROT 6.3* 6.7 6.9 6.4* 7.0  ALBUMIN 2.8* 3.0* 3.3* 3.2* 3.5   No results for input(s): LIPASE, AMYLASE in the last 168 hours. No results for input(s): AMMONIA in the last 168 hours. Coagulation Profile: No results for input(s): INR, PROTIME in the last 168 hours.  Cardiac Enzymes: No results for input(s): CKTOTAL, CKMB, CKMBINDEX, TROPONINI in the last 168 hours. BNP (last 3 results) No results for input(s): PROBNP in the last 8760 hours. HbA1C: No results for input(s): HGBA1C in the last 72 hours. CBG: Recent Labs  Lab 02/23/19 1101 02/23/19 1550 02/23/19 1946 02/24/19 0014 02/24/19 0436  GLUCAP 189* 272* 224* 128* 99   Lipid Profile: Recent Labs    02/22/19 0710  CHOL 150  HDL 24*  LDLCALC 95  TRIG 155*  CHOLHDL 6.3   Thyroid Function Tests: No results for input(s): TSH, T4TOTAL, FREET4, T3FREE, THYROIDAB in the last 72 hours. Anemia Panel: Recent Labs    02/22/19 0710  FERRITIN 1,021*   Urine analysis: No results found for: COLORURINE, APPEARANCEUR, LABSPEC, PHURINE, GLUCOSEU, HGBUR, BILIRUBINUR, KETONESUR, PROTEINUR, UROBILINOGEN, NITRITE, LEUKOCYTESUR Sepsis Labs: @LABRCNTIP (procalcitonin:4,lacticidven:4)  ) Recent Results (from the past 240 hour(s))  Blood Culture (routine x 2)     Status: None   Collection Time: 02/17/19 12:30 PM   Specimen: BLOOD RIGHT HAND  Result Value Ref Range Status   Specimen Description   Final    BLOOD RIGHT HAND Performed at [facility] [address] [place], [place] [address]    Special Requests   Final    BOTTLES DRAWN AEROBIC AND ANAEROBIC Blood Culture adequate volume Performed at [facility] [address], [place], [place] [address]    Culture   Final    NO GROWTH 5 DAYS Performed at [facility], [place] [address], [place], [place] [address]    Report Status 02/22/2019 FINAL  Final  Blood Culture (routine x 2)     Status: None   Collection Time: 02/17/19  5:14 PM   Specimen: BLOOD LEFT ARM  Result Value Ref Range Status   Specimen Description   Final    BLOOD LEFT ARM Performed at [facility], [place] [address], [place], [place] [address]    Special Requests   Final    BOTTLES DRAWN AEROBIC AND ANAEROBIC Blood Culture  results may not be optimal due to an excessive volume of blood received in culture bottles Performed at [facility] [address] [place], [place] [address]    Culture   Final    NO GROWTH 5 DAYS Performed at [facility], [place] [address], [place], [place] [address]    Report Status  02/22/2019 FINAL  Final  MRSA PCR Screening     Status: None   Collection Time: 02/18/19  1:05 PM   Specimen: Nasal Mucosa; Nasopharyngeal  Result Value Ref Range Status   MRSA by PCR NEGATIVE NEGATIVE Final    Comment:        The GeneXpert MRSA Assay (FDA approved for NASAL specimens only), is one component of a comprehensive MRSA colonization surveillance program. It is not intended to diagnose MRSA infection nor to guide or monitor treatment for MRSA infections. Performed at [facility], [place] [address], [place], [place] [address]          Radiology Studies: No results found.      Scheduled Meds: . amLODipine  5 mg Oral Daily  . atorvastatin  20 mg Oral q1800  . Chlorhexidine Gluconate Cloth  6 each Topical Q0600  . cholecalciferol  2,000 Units Oral Daily  . enoxaparin (LOVENOX) injection  40 mg Subcutaneous Q12H  . feeding supplement  1 Container Oral TID WC  . fluticasone  1 spray Each Nare Daily  . hydrochlorothiazide  12.5 mg Oral Daily  . insulin aspart  0-15 Units Subcutaneous Q4H  . ipratropium  2 puff Inhalation Q6H  . levalbuterol  2 puff Inhalation Q6H  . loratadine  10 mg Oral Daily  . mouth rinse  15 mL Mouth Rinse BID  . metFORMIN  500 mg Oral Q breakfast  . methylPREDNISolone (SOLU-MEDROL) injection  40 mg Intravenous Q24H  . multivitamin with minerals  1 tablet Oral Daily  . oxymetazoline  1 spray Each Nare BID  . pantoprazole  40 mg Oral BID AC  . traZODone  50 mg Oral QHS  . vitamin C  1,000 mg Oral Daily  . zinc sulfate  220 mg Oral Daily   Continuous Infusions:    LOS: 7 days   The patient is critically  ill with multiple organ systems failure and requires high complexity decision making for assessment and support, frequent evaluation and titration of therapies, application of advanced monitoring technologies and extensive interpretation of multiple databases. Critical Care Time devoted to patient care services described in this note  Time spent: 40 minutes     Kortnie Stovall, Geraldo Docker, MD [facility] Pager [phone]  If 7PM-7AM, please contact night-coverage [email or web address] Password [email or web address] 02/24/2019, 7:21 AM

## 2019-02-25 LAB — CBC WITH DIFFERENTIAL/PLATELET
Abs Immature Granulocytes: 0.31 10*3/uL — ABNORMAL HIGH (ref 0.00–0.07)
Basophils Absolute: 0 10*3/uL (ref 0.0–0.1)
Basophils Relative: 0 %
Eosinophils Absolute: 0 10*3/uL (ref 0.0–0.5)
Eosinophils Relative: 0 %
HCT: 46.8 % (ref 39.0–52.0)
Hemoglobin: 16.3 g/dL (ref 13.0–17.0)
Immature Granulocytes: 3 %
Lymphocytes Relative: 4 %
Lymphs Abs: 0.5 10*3/uL — ABNORMAL LOW (ref 0.7–4.0)
MCH: 31.3 pg (ref 26.0–34.0)
MCHC: 34.8 g/dL (ref 30.0–36.0)
MCV: 90 fL (ref 80.0–100.0)
Monocytes Absolute: 0.1 10*3/uL (ref 0.1–1.0)
Monocytes Relative: 1 %
Neutro Abs: 10.4 10*3/uL — ABNORMAL HIGH (ref 1.7–7.7)
Neutrophils Relative %: 92 %
Platelets: 374 10*3/uL (ref 150–400)
RBC: 5.2 MIL/uL (ref 4.22–5.81)
RDW: 12.8 % (ref 11.5–15.5)
WBC: 11.4 10*3/uL — ABNORMAL HIGH (ref 4.0–10.5)
nRBC: 0 % (ref 0.0–0.2)

## 2019-02-25 LAB — GLUCOSE, CAPILLARY
Glucose-Capillary: 117 mg/dL — ABNORMAL HIGH (ref 70–99)
Glucose-Capillary: 152 mg/dL — ABNORMAL HIGH (ref 70–99)
Glucose-Capillary: 162 mg/dL — ABNORMAL HIGH (ref 70–99)
Glucose-Capillary: 164 mg/dL — ABNORMAL HIGH (ref 70–99)
Glucose-Capillary: 213 mg/dL — ABNORMAL HIGH (ref 70–99)
Glucose-Capillary: 267 mg/dL — ABNORMAL HIGH (ref 70–99)

## 2019-02-25 LAB — D-DIMER, QUANTITATIVE: D-Dimer, Quant: 9.93 ug/mL-FEU — ABNORMAL HIGH (ref 0.00–0.50)

## 2019-02-25 LAB — COMPREHENSIVE METABOLIC PANEL
ALT: 80 U/L — ABNORMAL HIGH (ref 0–44)
AST: 47 U/L — ABNORMAL HIGH (ref 15–41)
Albumin: 3.1 g/dL — ABNORMAL LOW (ref 3.5–5.0)
Alkaline Phosphatase: 47 U/L (ref 38–126)
Anion gap: 15 (ref 5–15)
BUN: 34 mg/dL — ABNORMAL HIGH (ref 8–23)
CO2: 27 mmol/L (ref 22–32)
Calcium: 8.7 mg/dL — ABNORMAL LOW (ref 8.9–10.3)
Chloride: 93 mmol/L — ABNORMAL LOW (ref 98–111)
Creatinine, Ser: 1.05 mg/dL (ref 0.61–1.24)
GFR calc Af Amer: >60 mL/min (ref >60)
GFR calc non Af Amer: >60 mL/min (ref >60)
Glucose, Bld: 163 mg/dL — ABNORMAL HIGH (ref 70–99)
Potassium: 3.7 mmol/L (ref 3.5–5.1)
Sodium: 135 mmol/L (ref 135–145)
Total Bilirubin: 0.8 mg/dL (ref 0.3–1.2)
Total Protein: 6.4 g/dL — ABNORMAL LOW (ref 6.5–8.1)

## 2019-02-25 LAB — C-REACTIVE PROTEIN: CRP: <0.8 mg/dL (ref <1.0)

## 2019-02-25 LAB — PHOSPHORUS: Phosphorus: 5.1 mg/dL — ABNORMAL HIGH (ref 2.5–4.6)

## 2019-02-25 LAB — MAGNESIUM: Magnesium: 2 mg/dL (ref 1.7–2.4)

## 2019-02-25 MED ORDER — FUROSEMIDE 10 MG/ML IJ SOLN
40.0000 mg | Freq: Once | INTRAMUSCULAR | Status: AC
  Administered 2019-02-25: 09:00:00 40 mg via INTRAVENOUS
  Filled 2019-02-25: qty 4

## 2019-02-25 NOTE — Progress Notes (Signed)
Updated patient's daughter via phone call.

## 2019-02-25 NOTE — Plan of Care (Signed)
Progressing on all goals at this time.  

## 2019-02-25 NOTE — Progress Notes (Signed)
Called Belenda Cruise- pts daughter and asked if she had any questions regarding patients care. She denies having any questions at this time.

## 2019-02-25 NOTE — Progress Notes (Signed)
PROGRESS NOTE    Ronald Arnold  GYK:[id number] DOB: [birth year] DOA: 02/17/2019 PCP: Lawerance Cruel, MD   Brief Narrative:  Ronald Arnold a 63 y.o.WM PMHx prostate cancer status post surgery 5 years ago,history of DVT 2015 treated with Xarelto, HTN controlled wo meds, HLD.  Presents complaining of worsening shortness of breath. Patient currently day 10 of symptoms for COVID-19. He was diagnosed with cough with on Monday (7/27), at [facility] clinic.He presents complaining of worsening shortness of breath, cough. His oxygen dropped yesterday. He was able to be on 3 L of oxygen at home, his PCP for help arrange oxygen at home.  He denies chest pain, abdominal pain, diarrhea.   Evaluation in the ED: Patient temperature 101, oxygen saturation 92 on 2 L.Sodium 133 chloride 97, BUN 18, creatinine 1.93, calcium 8.0, albumin 3.0, AST 60, LDH 344, ferritin 711, CRP 9.6, procalcitonin 0.3 lactic acid 1.8, white blood cell 7.5, hemoglobin 13,d-dimer 0.9, fibrinogen 626. Chest x-ray:Worsened lung aeration with an increase in bilateral hazy airspace opacities consistent with multifocal pneumonia, pattern consistent with the diagnosis of COVID-19.   Subjective: 8/9 A/O x4, positive S OB.  Patient says slept well overnight.  Negative N/V   Assessment & Plan:   Principal Problem:   Pneumonia due to COVID-19 virus Active Problems:   Lower respiratory tract infection due to COVID-19 virus   HTN (hypertension)   HLD (hyperlipidemia)   Acute respiratory failure with hypoxia (HCC)   Hypoxia   Prediabetes   Acute Respiratory Failure with Hypoxia, COVID Pneumonia -Dx positive COVID 19 at your outpatient clinic. - Continue HHFNC.  Titrate to maintain SPO2> 90%.  However will tolerate permissive hypoxia when patient is active and as long as patient does not have AMS. - 8/8 Solu-Medrol 60 mg TID--> Decadron 6 mg daily - Xopenex 3 times daily -Ipratropium 3 times daily - Remdesivir  per  pharmacy protocol - 8/4 Actemra  - Vitamins per COVID protocol. - Currently patient doing well on HHFNC if O2 requirements increase prone patient for minimum of 2 -3 hours every 12 hours.  If patient can tolerate prone for 16 hours/day. - Daily COVID inflammatory markers pending Recent Labs  Lab 02/21/19 0228 02/22/19 0710 02/23/19 0038 02/24/19 0055 02/25/19 0018  CRP 2.7* 1.3* 1.2* 1.0* <0.8   Recent Labs  Lab 02/21/19 0228 02/22/19 0710 02/23/19 0038 02/24/19 0055 02/25/19 0018  DDIMER 15.44* 14.83* 11.52* 13.60* 9.93*  -Inflammatory markers decreasing -Continue to wean O2 as patient tolerates.  SPO2 goal> 89%  Essential HTN - Per patient at home no need for BP medication. - Continue amlodipine 5 mg daily(HOLD) - Hydralazine PRN(HOLD) - Hydrochlorothiazide 12.5 mg daily(HOLD) - Strict in and out -14.0 L -Daily weight Filed Weights   02/19/19 0500 02/24/19 0447 02/25/19 0500  Weight: 97.5 kg 93.2 kg 90.9 kg  -Continue diuresing patient.  Monitor electrolytes and renal function closely - Lasix 40 mg x 1  Elevated LFTs - Monitor closely Recent Labs  Lab 02/21/19 0228 02/22/19 0710 02/23/19 0038 02/24/19 0055 02/25/19 0018  AST 73* 56* 51* 52* 47*   Recent Labs  Lab 02/21/19 0228 02/22/19 0710 02/23/19 0038 02/24/19 0055 02/25/19 0018  ALT 72* 72* 71* 85* 80*  -Continue to improve  Prediabetes -8/4 hemoglobin A1c= 6.4 - Most likely exacerbated by steroids -8/5 metformin 500 mg daily - 8/6 increase moderate SSI   HLD - 8/6 lipid panel not within ADA/AHA guidelines - LDL goal<70 - Lipitor 20 mg daily  Hemoptysis -Per previous note patient was noted to have hemoptysis.  Monitor closely -Monitor H/H closely Recent Labs  Lab 02/21/19 0228 02/22/19 0710 02/23/19 0038 02/24/19 0055 02/25/19 0018  HGB 15.9 16.6 15.3 16.6 16.3  -Hemoglobin stable  Hx prostate cancer -S/p surgery 5 years ago.    DVT prophylaxis: Lovenox BID code Status:  Full Family Communication: .  Spoke with Ronald Arnold (daughter) went over plan of care answered all questions Disposition Plan: TBD   Consultants:  PCCM  Procedures/Significant Events:     I have personally reviewed and interpreted all radiology studies and my findings are as above.  VENTILATOR SETTINGS: HFNC O2 flow rate: 30 L/min FiO2: 60% SPO2: 100% (No change)    Cultures 8/1 blood RIGHT hand negative final 8/1 blood left arm negative final 8/1 acute hepatitis panel negative 8/1 HIV negative 8/2 MRSA by PCR negative    Antimicrobials: Anti-infectives (From admission, onward)   Start     Stop   02/18/19 1800  remdesivir 100 mg in sodium chloride 0.9 % 250 mL IVPB     02/22/19 1759   02/17/19 1530  remdesivir 200 mg in sodium chloride 0.9 % 250 mL IVPB     02/17/19 1808       Devices    LINES / TUBES:      Continuous Infusions:    Objective: Vitals:   02/25/19 0500 02/25/19 0615 02/25/19 0650 02/25/19 0700  BP:  99/62  118/76  Pulse: 93 87 98 87  Resp: (!) 24 (!) 21 19 (!) 24  Temp:      TempSrc:      SpO2: 98% 98% 93% 100%  Weight: 90.9 kg     Height:        Intake/Output Summary (Last 24 hours) at 02/25/2019 0737 Last data filed at 02/24/2019 2300 Gross per 24 hour  Intake 1200 ml  Output 3700 ml  Net -2500 ml   Filed Weights   02/19/19 0500 02/24/19 0447 02/25/19 0500  Weight: 97.5 kg 93.2 kg 90.9 kg   Physical Exam:  General: A/O x4, positive acute respiratory distress Eyes: negative scleral hemorrhage, negative anisocoria, negative icterus ENT: Negative Runny nose, negative gingival bleeding, Neck:  Negative scars, masses, torticollis, lymphadenopathy, JVD Lungs: Clear to auscultation bilaterally without wheezes or crackles Cardiovascular: Regular rate and rhythm without murmur gallop or rub normal S1 and S2 Abdomen: negative abdominal pain, nondistended, positive soft, bowel sounds, no rebound, no ascites, no appreciable  mass Extremities: No significant cyanosis, clubbing, or edema bilateral lower extremities Skin: Negative rashes, lesions, ulcers Psychiatric:  Negative depression, negative anxiety, negative fatigue, negative mania  Central nervous system:  Cranial nerves II through XII intact, tongue/uvula midline, all extremities muscle strength 5/5, sensation intact throughout, negative dysarthria, negative expressive aphasia, negative receptive aphasia.         Data Reviewed: Care during the described time interval was provided by me .  I have reviewed this patient's available data, including medical history, events of note, physical examination, and all test results as part of my evaluation.   CBC: Recent Labs  Lab 02/21/19 0228 02/22/19 0710 02/23/19 0038 02/24/19 0055 02/25/19 0018  WBC 6.8 5.8 8.4 10.8* 11.4*  NEUTROABS 5.7 4.7 6.8 9.0* 10.4*  HGB 15.9 16.6 15.3 16.6 16.3  HCT 47.9 50.1 45.2 49.0 46.8  MCV 93.0 90.9 90.9 91.6 90.0  PLT 247 352 343 393 233   Basic Metabolic Panel: Recent Labs  Lab 02/21/19 0228 02/22/19 0710 02/23/19 0038  02/24/19 0055 02/25/19 0018  NA 136 135 134* 133* 135  K 4.2 3.8 4.1 3.9 3.7  CL 100 96* 99 93* 93*  CO2 26 27 25 26 27   GLUCOSE 143* 161* 164* 127* 163*  BUN 29* 29* 34* 33* 34*  CREATININE 0.90 0.87 0.91 0.99 1.05  CALCIUM 8.4* 8.4* 8.3* 8.7* 8.7*  MG 2.2 2.1 2.2 2.1 2.0  PHOS  --  3.6 3.8 3.8 5.1*   GFR: Estimated Creatinine Clearance: 83.8 mL/min (by C-G formula based on SCr of 1.05 mg/dL). Liver Function Tests: Recent Labs  Lab 02/21/19 0228 02/22/19 0710 02/23/19 0038 02/24/19 0055 02/25/19 0018  AST 73* 56* 51* 52* 47*  ALT 72* 72* 71* 85* 80*  ALKPHOS 48 51 48 51 47  BILITOT 0.6 1.0 0.9 1.0 0.8  PROT 6.7 6.9 6.4* 7.0 6.4*  ALBUMIN 3.0* 3.3* 3.2* 3.5 3.1*   No results for input(s): LIPASE, AMYLASE in the last 168 hours. No results for input(s): AMMONIA in the last 168 hours. Coagulation Profile: No results for  input(s): INR, PROTIME in the last 168 hours. Cardiac Enzymes: No results for input(s): CKTOTAL, CKMB, CKMBINDEX, TROPONINI in the last 168 hours. BNP (last 3 results) No results for input(s): PROBNP in the last 8760 hours. HbA1C: No results for input(s): HGBA1C in the last 72 hours. CBG: Recent Labs  Lab 02/24/19 0850 02/24/19 1308 02/24/19 1638 02/24/19 2323 02/25/19 0444  GLUCAP 82 179* 215* 171* 152*   Lipid Profile: No results for input(s): CHOL, HDL, LDLCALC, TRIG, CHOLHDL, LDLDIRECT in the last 72 hours. Thyroid Function Tests: No results for input(s): TSH, T4TOTAL, FREET4, T3FREE, THYROIDAB in the last 72 hours. Anemia Panel: No results for input(s): VITAMINB12, FOLATE, FERRITIN, TIBC, IRON, RETICCTPCT in the last 72 hours. Urine analysis: No results found for: COLORURINE, APPEARANCEUR, LABSPEC, PHURINE, GLUCOSEU, HGBUR, BILIRUBINUR, KETONESUR, PROTEINUR, UROBILINOGEN, NITRITE, LEUKOCYTESUR Sepsis Labs: @LABRCNTIP (procalcitonin:4,lacticidven:4)  ) Recent Results (from the past 240 hour(s))  Blood Culture (routine x 2)     Status: None   Collection Time: 02/17/19 12:30 PM   Specimen: BLOOD RIGHT HAND  Result Value Ref Range Status   Specimen Description   Final    BLOOD RIGHT HAND Performed at [place] [address], [place], [place] [address]    Special Requests   Final    BOTTLES DRAWN AEROBIC AND ANAEROBIC Blood Culture adequate volume Performed at [place] [address] [place], [place] [address]    Culture   Final    NO GROWTH 5 DAYS Performed at [facility], [place] [address], [place], [place] [address]    Report Status 02/22/2019 FINAL  Final  Blood Culture (routine x 2)     Status: None   Collection Time: 02/17/19  5:14 PM   Specimen: BLOOD LEFT ARM  Result Value Ref Range Status   Specimen Description   Final    BLOOD LEFT ARM Performed at [facility], [place] [address], [place],  [place] [address]    Special Requests   Final    BOTTLES DRAWN AEROBIC AND ANAEROBIC Blood Culture results may not be optimal due to an excessive volume of blood received in culture bottles Performed at [place] [address] [place], [place] [address]    Culture   Final    NO GROWTH 5 DAYS Performed at [facility], [place] [address], [place], [place] [address]    Report Status 02/22/2019 FINAL  Final  MRSA PCR Screening     Status: None   Collection Time: 02/18/19  1:05 PM   Specimen: Nasal Mucosa; Nasopharyngeal  Result Value Ref Range Status   MRSA by PCR NEGATIVE NEGATIVE Final    Comment:        The GeneXpert MRSA Assay (FDA approved for NASAL specimens only), is one component of a comprehensive MRSA colonization surveillance program. It is not intended to diagnose MRSA infection nor to guide or monitor treatment for MRSA infections. Performed at [facility], [place] [address] [place], [place] [address]          Radiology Studies: No results found.      Scheduled Meds: . atorvastatin  20 mg Oral q1800  . Chlorhexidine Gluconate Cloth  6 each Topical Q0600  . cholecalciferol  2,000 Units Oral Daily  . dexamethasone  6 mg Oral Daily  . enoxaparin (LOVENOX) injection  40 mg Subcutaneous Q12H  . feeding supplement  1 Container Oral TID WC  . fluticasone  1 spray Each Nare Daily  . insulin aspart  0-15 Units Subcutaneous Q4H  . ipratropium  2 puff Inhalation Q6H  . levalbuterol  2 puff Inhalation Q6H  . loratadine  10 mg Oral Daily  . mouth rinse  15 mL Mouth Rinse BID  . metFORMIN  500 mg Oral Q breakfast  . multivitamin with minerals  1 tablet Oral Daily  . pantoprazole  40 mg Oral BID AC  . traZODone  50 mg Oral QHS  . vitamin C  1,000 mg Oral Daily  . zinc sulfate  220 mg Oral Daily   Continuous Infusions:    LOS: 8 days   The patient is critically ill with multiple organ systems failure and requires high  complexity decision making for assessment and support, frequent evaluation and titration of therapies, application of advanced monitoring technologies and extensive interpretation of multiple databases. Critical Care Time devoted to patient care services described in this note  Time spent: 40 minutes     Shakayla Hickox, Geraldo Docker, MD [facility] Pager [phone]  If 7PM-7AM, please contact night-coverage [email or web address] Password [facility] 02/25/2019, 7:37 AM

## 2019-02-25 NOTE — Progress Notes (Signed)
NAME:  Ronald Arnold, MRN:  [id number], DOB:  28-Mar-1956, LOS: 8 ADMISSION DATE:  02/17/2019, CONSULTATION DATE:  02/18/19 REFERRING MD:  Florene Glen, CHIEF COMPLAINT:  Dyspnea  Brief History   63 y/o male admitted for COVID 19 pneumonia causing acute respiratory failure with hypxoemia on 8/1.  Initially admitted on [facility] transferred to [facility] on 8/5 when a bed was available.    Past Medical History  Prostate cancer s/p prostatectomy 2015 DVT 2015  Significant Hospital Events   8/1 admission 8/2 placed in ICU, heated high flow, prone positioning 8/5 moved to [facility] ICU  Consults:  PCCM  Procedures:    Significant Diagnostic Tests:    Micro Data:  7/27 SARS COV2 positive BC x 2 8/1 >> ng  MRSA screen 8/2 neg  Antimicrobials/COVID treatment:  8/1 decadron >  8/1, 8/4 tocilizumab  8/1>8/5 remdesivir   Interim history/subjective:   Sats remained overall stable overnight. Eating breakfast. Oxygenation sats 88-96%  Objective   Blood pressure 121/89, pulse (!) 108, temperature 97.6 F (36.4 C), temperature source Oral, resp. rate (!) 22, height 5\' 10"  (1.778 m), weight 90.9 kg, SpO2 91 %.    FiO2 (%):  [70 %-100 %] 70 %   Intake/Output Summary (Last 24 hours) at 02/25/2019 1718 Last data filed at 02/25/2019 1400 Gross per 24 hour  Intake 1090 ml  Output 2825 ml  Net -1735 ml   Filed Weights   02/19/19 0500 02/24/19 0447 02/25/19 0500  Weight: 97.5 kg 93.2 kg 90.9 kg   Physical Exam: General: Well-appearing, no acute distress, on heated high flow HENT: [place], AT, OP clear, MMM Eyes: EOMI, no scleral icterus Respiratory: Diminished breath sounds bilaterally. No crackles, wheezing or rales Cardiovascular: RRR, -M/R/G, no JVD GI: BS+, soft, nontender Extremities:-Edema,-tenderness Neuro: AAO x4, CNII-XII grossly intact Skin: Intact, no rashes or bruising Psych: Normal mood, normal affect  Resolved Hospital Problem list     Assessment & Plan:   Acute hypoxemic respiratory  failure secondary to COVID-19: High risk for intubation Wean heated high flow Maintain O2 saturations for goal SpO2 >85% Monitor closely for signs of ventilatory failure such as nasal flaring, accessory muscle use, abdominal paradoxical movements. Self-proning as tolerated  Decadron 6 mg IV/PO daily  x 10 days Pulmonary hygiene including bronchodilator and flutter valve. AVOID manual percussion. Mobilize as tolerated Diurese  Best practice:  Diet: regular diet Pain/Anxiety/Delirium protocol (if indicated): n/a VAP protocol (if indicated): n/a DVT prophylaxis: lovenox bid per COVID protocol GI prophylaxis: n/a Glucose control: monitor, per TRH Mobility: out of bed to chair today Code Status: full Family Communication: Updated wife, daughter via telephone on 8/7. No answer on 8/8. Disposition: remain in ICU  Labs   CBC: Recent Labs  Lab 02/21/19 0228 02/22/19 0710 02/23/19 0038 02/24/19 0055 02/25/19 0018  WBC 6.8 5.8 8.4 10.8* 11.4*  NEUTROABS 5.7 4.7 6.8 9.0* 10.4*  HGB 15.9 16.6 15.3 16.6 16.3  HCT 47.9 50.1 45.2 49.0 46.8  MCV 93.0 90.9 90.9 91.6 90.0  PLT 247 352 343 393 315    Basic Metabolic Panel: Recent Labs  Lab 02/21/19 0228 02/22/19 0710 02/23/19 0038 02/24/19 0055 02/25/19 0018  NA 136 135 134* 133* 135  K 4.2 3.8 4.1 3.9 3.7  CL 100 96* 99 93* 93*  CO2 26 27 25 26 27   GLUCOSE 143* 161* 164* 127* 163*  BUN 29* 29* 34* 33* 34*  CREATININE 0.90 0.87 0.91 0.99 1.05  CALCIUM 8.4* 8.4* 8.3* 8.7* 8.7*  MG 2.2 2.1 2.2 2.1 2.0  PHOS  --  3.6 3.8 3.8 5.1*   GFR: Estimated Creatinine Clearance: 83.8 mL/min (by C-G formula based on SCr of 1.05 mg/dL). Recent Labs  Lab 02/22/19 0710 02/23/19 0038 02/24/19 0055 02/25/19 0018  WBC 5.8 8.4 10.8* 11.4*    Liver Function Tests: Recent Labs  Lab 02/21/19 0228 02/22/19 0710 02/23/19 0038 02/24/19 0055 02/25/19 0018  AST 73* 56* 51* 52* 47*  ALT 72* 72* 71* 85* 80*  ALKPHOS 48 51 48 51 47   BILITOT 0.6 1.0 0.9 1.0 0.8  PROT 6.7 6.9 6.4* 7.0 6.4*  ALBUMIN 3.0* 3.3* 3.2* 3.5 3.1*   No results for input(s): LIPASE, AMYLASE in the last 168 hours. No results for input(s): AMMONIA in the last 168 hours.  ABG    Component Value Date/Time   PHART 7.467 (H) 02/17/2019 2340   PCO2ART 34.8 02/17/2019 2340   PO2ART 78.8 (L) 02/17/2019 2340   HCO3 24.1 02/17/2019 2340   TCO2 26 07/04/2014 1527   O2SAT 94.0 02/17/2019 2340     Coagulation Profile: No results for input(s): INR, PROTIME in the last 168 hours.  Cardiac Enzymes: No results for input(s): CKTOTAL, CKMB, CKMBINDEX, TROPONINI in the last 168 hours.  HbA1C: Hgb A1c MFr Bld  Date/Time Value Ref Range Status  02/20/2019 02:25 AM 6.2 (H) 4.8 - 5.6 % Final    Comment:    (NOTE) Pre diabetes:          5.7%-6.4% Diabetes:              >6.4% Glycemic control for   <7.0% adults with diabetes     CBG: Recent Labs  Lab 02/24/19 2323 02/25/19 0444 02/25/19 0730 02/25/19 1134 02/25/19 1652  GLUCAP 171* 152* 117* 164* 213*     Critical care time: 36 minutes    The patient is critically ill with multiple organ systems failure and requires high complexity decision making for assessment and support, frequent evaluation and titration of therapies, application of advanced monitoring technologies and extensive interpretation of multiple databases.   Discussed and co-managed patient care with PCCM-Hospitalist. Coordinated care with RT, RN and pharmacist.  Rodman Pickle, M.D. [facility] Pulmonary/Critical Care Medicine 02/25/2019 5:18 PM  Pager: [phone] After hours pager: [phone]

## 2019-02-26 LAB — CBC WITH DIFFERENTIAL/PLATELET
Abs Immature Granulocytes: 0.26 10*3/uL — ABNORMAL HIGH (ref 0.00–0.07)
Basophils Absolute: 0.1 10*3/uL (ref 0.0–0.1)
Basophils Relative: 0 %
Eosinophils Absolute: 0 10*3/uL (ref 0.0–0.5)
Eosinophils Relative: 0 %
HCT: 44.7 % (ref 39.0–52.0)
Hemoglobin: 15.6 g/dL (ref 13.0–17.0)
Immature Granulocytes: 1 %
Lymphocytes Relative: 4 %
Lymphs Abs: 0.8 10*3/uL (ref 0.7–4.0)
MCH: 31.5 pg (ref 26.0–34.0)
MCHC: 34.9 g/dL (ref 30.0–36.0)
MCV: 90.1 fL (ref 80.0–100.0)
Monocytes Absolute: 0.5 10*3/uL (ref 0.1–1.0)
Monocytes Relative: 3 %
Neutro Abs: 17.4 10*3/uL — ABNORMAL HIGH (ref 1.7–7.7)
Neutrophils Relative %: 92 %
Platelets: 356 10*3/uL (ref 150–400)
RBC: 4.96 MIL/uL (ref 4.22–5.81)
RDW: 12.6 % (ref 11.5–15.5)
WBC: 19 10*3/uL — ABNORMAL HIGH (ref 4.0–10.5)
nRBC: 0 % (ref 0.0–0.2)

## 2019-02-26 LAB — COMPREHENSIVE METABOLIC PANEL
ALT: 68 U/L — ABNORMAL HIGH (ref 0–44)
AST: 41 U/L (ref 15–41)
Albumin: 2.9 g/dL — ABNORMAL LOW (ref 3.5–5.0)
Alkaline Phosphatase: 46 U/L (ref 38–126)
Anion gap: 11 (ref 5–15)
BUN: 34 mg/dL — ABNORMAL HIGH (ref 8–23)
CO2: 28 mmol/L (ref 22–32)
Calcium: 8.4 mg/dL — ABNORMAL LOW (ref 8.9–10.3)
Chloride: 93 mmol/L — ABNORMAL LOW (ref 98–111)
Creatinine, Ser: 0.93 mg/dL (ref 0.61–1.24)
GFR calc Af Amer: >60 mL/min (ref >60)
GFR calc non Af Amer: >60 mL/min (ref >60)
Glucose, Bld: 114 mg/dL — ABNORMAL HIGH (ref 70–99)
Potassium: 4 mmol/L (ref 3.5–5.1)
Sodium: 132 mmol/L — ABNORMAL LOW (ref 135–145)
Total Bilirubin: 0.8 mg/dL (ref 0.3–1.2)
Total Protein: 5.9 g/dL — ABNORMAL LOW (ref 6.5–8.1)

## 2019-02-26 LAB — C-REACTIVE PROTEIN: CRP: <0.8 mg/dL (ref <1.0)

## 2019-02-26 LAB — GLUCOSE, CAPILLARY
Glucose-Capillary: 102 mg/dL — ABNORMAL HIGH (ref 70–99)
Glucose-Capillary: 108 mg/dL — ABNORMAL HIGH (ref 70–99)
Glucose-Capillary: 141 mg/dL — ABNORMAL HIGH (ref 70–99)
Glucose-Capillary: 204 mg/dL — ABNORMAL HIGH (ref 70–99)
Glucose-Capillary: 240 mg/dL — ABNORMAL HIGH (ref 70–99)
Glucose-Capillary: 88 mg/dL (ref 70–99)

## 2019-02-26 LAB — D-DIMER, QUANTITATIVE: D-Dimer, Quant: 6.45 ug/mL-FEU — ABNORMAL HIGH (ref 0.00–0.50)

## 2019-02-26 LAB — MAGNESIUM: Magnesium: 2.1 mg/dL (ref 1.7–2.4)

## 2019-02-26 LAB — PHOSPHORUS: Phosphorus: 4.4 mg/dL (ref 2.5–4.6)

## 2019-02-26 MED ORDER — SODIUM CHLORIDE 0.9 % IV SOLN
2.0000 g | INTRAVENOUS | Status: AC
  Administered 2019-02-26 – 2019-03-02 (×5): 2 g via INTRAVENOUS
  Filled 2019-02-26 (×5): qty 20

## 2019-02-26 MED ORDER — FUROSEMIDE 10 MG/ML IJ SOLN
20.0000 mg | Freq: Once | INTRAMUSCULAR | Status: AC
  Administered 2019-02-26: 20 mg via INTRAVENOUS
  Filled 2019-02-26: qty 2

## 2019-02-26 NOTE — Progress Notes (Signed)
Pt found to have small amount of blood coming out of nose. No active bleeding. This RN notified RT. Will continue to monitor.

## 2019-02-26 NOTE — Plan of Care (Signed)
Pt stated that his daughter has been sufficiently updated and "is good for the night". The pt bathed with assistance and tolerated well. Denies pain and SOB. Productive cough observed. Pt refused to sit in the chair for the bath.

## 2019-02-26 NOTE — Progress Notes (Signed)
NAME:  Ronald Arnold, MRN:  [id number], DOB:  [birth year], LOS: 9 ADMISSION DATE:  02/17/2019, CONSULTATION DATE:  02/18/19 REFERRING MD:  Florene Glen, CHIEF COMPLAINT:  Dyspnea  Brief History   63 y/o male admitted for COVID 19 pneumonia causing acute respiratory failure with hypxoemia on 8/1.  Initially admitted on [facility], transferred to [facility] on 8/5 when a bed was available.    Past Medical History  Prostate cancer s/p prostatectomy 2015 DVT 2015  Significant Hospital Events   8/1 admission 8/2 placed in ICU, heated high flow, prone positioning 8/5 moved to [facility] ICU  Consults:  PCCM  Procedures:    Significant Diagnostic Tests:    Micro Data:  7/27 SARS COV2 positive BC x 2 8/1 >> ng  MRSA screen 8/2 neg  Antimicrobials/COVID treatment:  8/1 decadron >  8/1, 8/4 tocilizumab  8/1>8/5 remdesivir   Interim history/subjective:   Proning and sleeping this morning. Appears comfortable.  Objective   Blood pressure 116/72, pulse 74, temperature 99.1 F (37.3 C), temperature source Axillary, resp. rate 20, height 5\' 10"  (1.778 m), weight 91.3 kg, SpO2 97 %.    FiO2 (%):  [50 %-80 %] 50 %   Intake/Output Summary (Last 24 hours) at 02/26/2019 0911 Last data filed at 02/25/2019 2345 Gross per 24 hour  Intake 2170 ml  Output 1750 ml  Net 420 ml   Filed Weights   02/24/19 0447 02/25/19 0500 02/26/19 0500  Weight: 93.2 kg 90.9 kg 91.3 kg   Physical Exam: General: Well-appearing, no acute distress, on heated high flow, proned HENT: [place], AT, OP clear, MMM Eyes: EOMI, no scleral icterus Respiratory: Diminished breath sounds bilaterally. No crackles, wheezing or rales Cardiovascular: RRR, -M/R/G, no JVD GI: BS+, soft, nontender Extremities:-Edema,-tenderness Neuro: AAO x4, CNII-XII grossly intact Skin: Intact, no rashes or bruising Psych: Normal mood, normal affect  Resolved Hospital Problem list     Assessment & Plan:   Acute hypoxemic respiratory failure secondary to  COVID-19: High risk for intubation Maintain O2 saturations for goal SpO2 >85% Monitor closely for signs of ventilatory failure such as nasal flaring, accessory muscle use, abdominal paradoxical movements. Ensure facemask with HFNC Self-proning as tolerated  Solumedrol 0.5-1 mg/kg daily or Decadron 6 mg IV/PO daily  x 10 days Pulmonary hygiene including bronchodilator and flutter valve. AVOID manual percussion. Mobilize as tolerated Diurese  Best practice:  Diet: regular diet Pain/Anxiety/Delirium protocol (if indicated): n/a VAP protocol (if indicated): n/a DVT prophylaxis: lovenox bid per COVID protocol GI prophylaxis: n/a Glucose control: monitor, per TRH Mobility: out of bed to chair today Code Status: full Family Communication: Updated daughter on 8/10 Disposition: remain in ICU  Labs   CBC: Recent Labs  Lab 02/22/19 0710 02/23/19 0038 02/24/19 0055 02/25/19 0018 02/26/19 0555  WBC 5.8 8.4 10.8* 11.4* 19.0*  NEUTROABS 4.7 6.8 9.0* 10.4* 17.4*  HGB 16.6 15.3 16.6 16.3 15.6  HCT 50.1 45.2 49.0 46.8 44.7  MCV 90.9 90.9 91.6 90.0 90.1  PLT 352 343 393 374 119    Basic Metabolic Panel: Recent Labs  Lab 02/22/19 0710 02/23/19 0038 02/24/19 0055 02/25/19 0018 02/26/19 0555  NA 135 134* 133* 135 132*  K 3.8 4.1 3.9 3.7 4.0  CL 96* 99 93* 93* 93*  CO2 27 25 26 27 28   GLUCOSE 161* 164* 127* 163* 114*  BUN 29* 34* 33* 34* 34*  CREATININE 0.87 0.91 0.99 1.05 0.93  CALCIUM 8.4* 8.3* 8.7* 8.7* 8.4*  MG 2.1 2.2 2.1 2.0 2.1  PHOS 3.6 3.8 3.8 5.1* 4.4   GFR: Estimated Creatinine Clearance: 94.7 mL/min (by C-G formula based on SCr of 0.93 mg/dL). Recent Labs  Lab 02/23/19 0038 02/24/19 0055 02/25/19 0018 02/26/19 0555  WBC 8.4 10.8* 11.4* 19.0*    Liver Function Tests: Recent Labs  Lab 02/22/19 0710 02/23/19 0038 02/24/19 0055 02/25/19 0018 02/26/19 0555  AST 56* 51* 52* 47* 41  ALT 72* 71* 85* 80* 68*  ALKPHOS 51 48 51 47 46  BILITOT 1.0 0.9 1.0 0.8  0.8  PROT 6.9 6.4* 7.0 6.4* 5.9*  ALBUMIN 3.3* 3.2* 3.5 3.1* 2.9*   No results for input(s): LIPASE, AMYLASE in the last 168 hours. No results for input(s): AMMONIA in the last 168 hours.  ABG    Component Value Date/Time   PHART 7.467 (H) 02/17/2019 2340   PCO2ART 34.8 02/17/2019 2340   PO2ART 78.8 (L) 02/17/2019 2340   HCO3 24.1 02/17/2019 2340   TCO2 26 07/04/2014 1527   O2SAT 94.0 02/17/2019 2340     Coagulation Profile: No results for input(s): INR, PROTIME in the last 168 hours.  Cardiac Enzymes: No results for input(s): CKTOTAL, CKMB, CKMBINDEX, TROPONINI in the last 168 hours.  HbA1C: Hgb A1c MFr Bld  Date/Time Value Ref Range Status  02/20/2019 02:25 AM 6.2 (H) 4.8 - 5.6 % Final    Comment:    (NOTE) Pre diabetes:          5.7%-6.4% Diabetes:              >6.4% Glycemic control for   <7.0% adults with diabetes     CBG: Recent Labs  Lab 02/25/19 1652 02/25/19 1947 02/25/19 2338 02/26/19 0440 02/26/19 0836  GLUCAP 213* 267* 162* 108* 88     Critical care time: 36 minutes    The patient is critically ill with multiple organ systems failure and requires high complexity decision making for assessment and support, frequent evaluation and titration of therapies, application of advanced monitoring technologies and extensive interpretation of multiple databases.   Discussed and co-managed patient care with PCCM-Hospitalist. Coordinated care with RT, RN and pharmacist.  Rodman Pickle, M.D. [facility] Pulmonary/Critical Care Medicine 02/26/2019 9:12 AM  Pager: [phone] After hours pager: [phone]

## 2019-02-26 NOTE — Progress Notes (Signed)
PROGRESS NOTE    Ronald Arnold  ZOX:[id number] DOB: 05-23-1956 DOA: 02/17/2019 PCP: Lawerance Cruel, MD   Brief Narrative:  Ronald Sandlin Simkinsis a 63 y.o.WM PMHx prostate cancer status post surgery 5 years ago,history of DVT 2015 treated with Xarelto, HTN controlled wo meds, HLD.  Presents complaining of worsening shortness of breath. Patient currently day 10 of symptoms for COVID-19. He was diagnosed with cough with on Monday (7/27), at [facility] clinic.He presents complaining of worsening shortness of breath, cough. His oxygen dropped yesterday. He was able to be on 3 L of oxygen at home, his PCP for help arrange oxygen at home.  He denies chest pain, abdominal pain, diarrhea.   Evaluation in the ED: Patient temperature 101, oxygen saturation 92 on 2 L.Sodium 133 chloride 97, BUN 18, creatinine 1.93, calcium 8.0, albumin 3.0, AST 60, LDH 344, ferritin 711, CRP 9.6, procalcitonin 0.3 lactic acid 1.8, white blood cell 7.5, hemoglobin 13,d-dimer 0.9, fibrinogen 626. Chest x-ray:Worsened lung aeration with an increase in bilateral hazy airspace opacities consistent with multifocal pneumonia, pattern consistent with the diagnosis of COVID-19.   Subjective: 8/10 Sleepy but arousable in the prone position.  A/O x4.  States he slept well.    Assessment & Plan:   Principal Problem:   Pneumonia due to COVID-19 virus Active Problems:   Lower respiratory tract infection due to COVID-19 virus   HTN (hypertension)   HLD (hyperlipidemia)   Acute respiratory failure with hypoxia (HCC)   Hypoxia   Prediabetes   Acute Respiratory Failure with Hypoxia, COVID Pneumonia -Dx positive COVID 19 at your outpatient clinic. - Continue HHFNC.  Titrate to maintain SPO2> 90%.  However will tolerate permissive hypoxia when patient is active and as long as patient does not have AMS. - 8/8 Solu-Medrol 60 mg TID--> Decadron 6 mg daily - Xopenex 3 times daily -Ipratropium 3 times daily - Remdesivir   per pharmacy protocol - 8/4 Actemra  - Vitamins per COVID protocol. - Currently patient doing well on HHFNC. -Patient self proning.  Usually sleeps the whole night in prone position.  - Daily COVID inflammatory markers pending Recent Labs  Lab 02/22/19 0710 02/23/19 0038 02/24/19 0055 02/25/19 0018 02/26/19 0555  CRP 1.3* 1.2* 1.0* <0.8 <0.8   Recent Labs  Lab 02/21/19 0228 02/22/19 0710 02/23/19 0038 02/24/19 0055 02/25/19 0018  DDIMER 15.44* 14.83* 11.52* 13.60* 9.93*  -Inflammatory markers decreasing -Continue to wean O2 as patient tolerates.  SPO2 goal> 89%  Essential HTN - Per patient at home no need for BP medication. - Continue amlodipine 5 mg daily(HOLD) - Hydralazine PRN(HOLD) - Hydrochlorothiazide 12.5 mg daily(HOLD) - Strict in and out -13.4 L -Daily weight Filed Weights   02/24/19 0447 02/25/19 0500 02/26/19 0500  Weight: 93.2 kg 90.9 kg 91.3 kg  -Continue diuresing patient.  Monitor electrolytes and renal function closely - 8/10 Lasix IV 20 mg  Elevated LFTs - Monitor closely Recent Labs  Lab 02/22/19 0710 02/23/19 0038 02/24/19 0055 02/25/19 0018 02/26/19 0555  AST 56* 51* 52* 47* 41   Recent Labs  Lab 02/22/19 0710 02/23/19 0038 02/24/19 0055 02/25/19 0018 02/26/19 0555  ALT 72* 71* 85* 80* 68*  -Continue to improve  Prediabetes -8/4 hemoglobin A1c= 6.4 - Most likely exacerbated by steroids -8/5 metformin 500 mg daily - 8/6 increase moderate SSI   HLD - 8/6 lipid panel not within ADA/AHA guidelines - LDL goal<70 - Lipitor 20 mg daily  Hemoptysis -Per previous note patient was noted to have  hemoptysis.  Monitor closely -Monitor H/H closely Recent Labs  Lab 02/22/19 0710 02/23/19 0038 02/24/19 0055 02/25/19 0018 02/26/19 0555  HGB 16.6 15.3 16.6 16.3 15.6  -Hemoglobin stable  Hx prostate cancer -S/p surgery 5 years ago.    DVT prophylaxis: Lovenox 40 mg BID code Status: Full Family Communication: .  Spoke with  Sirr Kabel (daughter) went over plan of care answered all questions Disposition Plan: TBD   Consultants:  PCCM  Procedures/Significant Events:     I have personally reviewed and interpreted all radiology studies and my findings are as above.  VENTILATOR SETTINGS:* HFNC O2 flow rate: 30 L/min FiO2: 50% SPO2: 97% (No change)    Cultures 8/1 blood RIGHT hand negative final 8/1 blood left arm negative final 8/1 acute hepatitis panel negative 8/1 HIV negative 8/2 MRSA by PCR negative    Antimicrobials: Anti-infectives (From admission, onward)   Start     Stop   02/18/19 1800  remdesivir 100 mg in sodium chloride 0.9 % 250 mL IVPB     02/22/19 1759   02/17/19 1530  remdesivir 200 mg in sodium chloride 0.9 % 250 mL IVPB     02/17/19 1808       Devices    LINES / TUBES:      Continuous Infusions:    Objective: Vitals:   02/26/19 0400 02/26/19 0500 02/26/19 0600 02/26/19 0700  BP: 115/68 114/71 126/79 119/74  Pulse: 88 83 82 77  Resp: (!) 21 (!) 25 (!) 23 (!) 23  Temp: 97.8 F (36.6 C)     TempSrc: Axillary     SpO2: 98% 97% 99% 99%  Weight:  91.3 kg    Height:        Intake/Output Summary (Last 24 hours) at 02/26/2019 9562 Last data filed at 02/25/2019 2345 Gross per 24 hour  Intake 2170 ml  Output 2400 ml  Net -230 ml   Filed Weights   02/24/19 0447 02/25/19 0500 02/26/19 0500  Weight: 93.2 kg 90.9 kg 91.3 kg   Physical Exam:  General: Sleepy but arousable, A/O x4, positive acute respiratory distress Eyes: negative scleral hemorrhage, negative anisocoria, negative icterus ENT: Negative Runny nose, negative gingival bleeding, Neck:  Negative scars, masses, torticollis, lymphadenopathy, JVD Lungs: Clear to auscultation bilaterally without wheezes or crackles Cardiovascular: Regular rate and rhythm without murmur gallop or rub normal S1 and S2 Abdomen: negative abdominal pain, nondistended, positive soft, bowel sounds, no rebound, no  ascites, no appreciable mass Extremities: No significant cyanosis, clubbing, or edema bilateral lower extremities Skin: Negative rashes, lesions, ulcers Psychiatric:  Negative depression, negative anxiety, negative fatigue, negative mania  Central nervous system:  Cranial nerves II through XII intact, tongue/uvula midline, all extremities muscle strength 5/5, sensation intact throughout, negative dysarthria, negative expressive aphasia, negative receptive aphasia.          Data Reviewed: Care during the described time interval was provided by me .  I have reviewed this patient's available data, including medical history, events of note, physical examination, and all test results as part of my evaluation.   CBC: Recent Labs  Lab 02/22/19 0710 02/23/19 0038 02/24/19 0055 02/25/19 0018 02/26/19 0555  WBC 5.8 8.4 10.8* 11.4* 19.0*  NEUTROABS 4.7 6.8 9.0* 10.4* 17.4*  HGB 16.6 15.3 16.6 16.3 15.6  HCT 50.1 45.2 49.0 46.8 44.7  MCV 90.9 90.9 91.6 90.0 90.1  PLT 352 343 393 374 130   Basic Metabolic Panel: Recent Labs  Lab 02/22/19 0710 02/23/19 0038 02/24/19  0055 02/25/19 0018 02/26/19 0555  NA 135 134* 133* 135 132*  K 3.8 4.1 3.9 3.7 4.0  CL 96* 99 93* 93* 93*  CO2 27 25 26 27 28   GLUCOSE 161* 164* 127* 163* 114*  BUN 29* 34* 33* 34* 34*  CREATININE 0.87 0.91 0.99 1.05 0.93  CALCIUM 8.4* 8.3* 8.7* 8.7* 8.4*  MG 2.1 2.2 2.1 2.0 2.1  PHOS 3.6 3.8 3.8 5.1* 4.4   GFR: Estimated Creatinine Clearance: 94.7 mL/min (by C-G formula based on SCr of 0.93 mg/dL). Liver Function Tests: Recent Labs  Lab 02/22/19 0710 02/23/19 0038 02/24/19 0055 02/25/19 0018 02/26/19 0555  AST 56* 51* 52* 47* 41  ALT 72* 71* 85* 80* 68*  ALKPHOS 51 48 51 47 46  BILITOT 1.0 0.9 1.0 0.8 0.8  PROT 6.9 6.4* 7.0 6.4* 5.9*  ALBUMIN 3.3* 3.2* 3.5 3.1* 2.9*   No results for input(s): LIPASE, AMYLASE in the last 168 hours. No results for input(s): AMMONIA in the last 168 hours. Coagulation  Profile: No results for input(s): INR, PROTIME in the last 168 hours. Cardiac Enzymes: No results for input(s): CKTOTAL, CKMB, CKMBINDEX, TROPONINI in the last 168 hours. BNP (last 3 results) No results for input(s): PROBNP in the last 8760 hours. HbA1C: No results for input(s): HGBA1C in the last 72 hours. CBG: Recent Labs  Lab 02/25/19 1134 02/25/19 1652 02/25/19 1947 02/25/19 2338 02/26/19 0440  GLUCAP 164* 213* 267* 162* 108*   Lipid Profile: No results for input(s): CHOL, HDL, LDLCALC, TRIG, CHOLHDL, LDLDIRECT in the last 72 hours. Thyroid Function Tests: No results for input(s): TSH, T4TOTAL, FREET4, T3FREE, THYROIDAB in the last 72 hours. Anemia Panel: No results for input(s): VITAMINB12, FOLATE, FERRITIN, TIBC, IRON, RETICCTPCT in the last 72 hours. Urine analysis: No results found for: COLORURINE, APPEARANCEUR, LABSPEC, PHURINE, GLUCOSEU, HGBUR, BILIRUBINUR, KETONESUR, PROTEINUR, UROBILINOGEN, NITRITE, LEUKOCYTESUR Sepsis Labs: @LABRCNTIP (procalcitonin:4,lacticidven:4)  ) Recent Results (from the past 240 hour(s))  Blood Culture (routine x 2)     Status: None   Collection Time: 02/17/19 12:30 PM   Specimen: BLOOD RIGHT HAND  Result Value Ref Range Status   Specimen Description   Final    BLOOD RIGHT HAND Performed at [facility] [address], [place], [place] [address]    Special Requests   Final    BOTTLES DRAWN AEROBIC AND ANAEROBIC Blood Culture adequate volume Performed at [facility] [address], [place], [place] [address]    Culture   Final    NO GROWTH 5 DAYS Performed at [facility], [place] [address], [place], [place] [address]    Report Status 02/22/2019 FINAL  Final  Blood Culture (routine x 2)     Status: None   Collection Time: 02/17/19  5:14 PM   Specimen: BLOOD LEFT ARM  Result Value Ref Range Status   Specimen Description   Final    BLOOD LEFT ARM Performed at [facility],  [place] [address], [place], [place] [address]    Special Requests   Final    BOTTLES DRAWN AEROBIC AND ANAEROBIC Blood Culture results may not be optimal due to an excessive volume of blood received in culture bottles Performed at [facility] [address] [place], [place] [address]    Culture   Final    NO GROWTH 5 DAYS Performed at [facility], [place] [address] [place], [place] [address]    Report Status 02/22/2019 FINAL  Final  MRSA PCR Screening     Status: None   Collection Time: 02/18/19  1:05 PM   Specimen: Nasal Mucosa; Nasopharyngeal  Result Value Ref Range Status   MRSA by PCR NEGATIVE NEGATIVE Final    Comment:        The GeneXpert MRSA Assay (FDA approved for NASAL specimens only), is one component of a comprehensive MRSA colonization surveillance program. It is not intended to diagnose MRSA infection nor to guide or monitor treatment for MRSA infections. Performed at [facility], [place] [address], [place], [place] [address]          Radiology Studies: No results found.      Scheduled Meds: . atorvastatin  20 mg Oral q1800  . Chlorhexidine Gluconate Cloth  6 each Topical Q0600  . cholecalciferol  2,000 Units Oral Daily  . dexamethasone  6 mg Oral Daily  . enoxaparin (LOVENOX) injection  40 mg Subcutaneous Q12H  . feeding supplement  1 Container Oral TID WC  . fluticasone  1 spray Each Nare Daily  . insulin aspart  0-15 Units Subcutaneous Q4H  . ipratropium  2 puff Inhalation Q6H  . levalbuterol  2 puff Inhalation Q6H  . loratadine  10 mg Oral Daily  . mouth rinse  15 mL Mouth Rinse BID  . metFORMIN  500 mg Oral Q breakfast  . multivitamin with minerals  1 tablet Oral Daily  . pantoprazole  40 mg Oral BID AC  . traZODone  50 mg Oral QHS  . vitamin C  1,000 mg Oral Daily  . zinc sulfate  220 mg Oral Daily   Continuous Infusions:    LOS: 9 days   The patient is critically ill with multiple organ systems  failure and requires high complexity decision making for assessment and support, frequent evaluation and titration of therapies, application of advanced monitoring technologies and extensive interpretation of multiple databases. Critical Care Time devoted to patient care services described in this note  Time spent: 40 minutes     Jermany Rimel, Geraldo Docker, MD [facility] Pager [phone]  If 7PM-7AM, please contact night-coverage [email or web address] Password [facility] 02/26/2019, 7:38 AM

## 2019-02-26 NOTE — Plan of Care (Signed)
Pt has been updated on and agrees with current POC. 

## 2019-02-27 DIAGNOSIS — J80 Acute respiratory distress syndrome: Secondary | ICD-10-CM

## 2019-02-27 DIAGNOSIS — J13 Pneumonia due to Streptococcus pneumoniae: Secondary | ICD-10-CM

## 2019-02-27 LAB — CBC WITH DIFFERENTIAL/PLATELET
Abs Immature Granulocytes: 0.21 10*3/uL — ABNORMAL HIGH (ref 0.00–0.07)
Basophils Absolute: 0.1 10*3/uL (ref 0.0–0.1)
Basophils Relative: 0 %
Eosinophils Absolute: 0.1 10*3/uL (ref 0.0–0.5)
Eosinophils Relative: 1 %
HCT: 47.2 % (ref 39.0–52.0)
Hemoglobin: 16.4 g/dL (ref 13.0–17.0)
Immature Granulocytes: 1 %
Lymphocytes Relative: 7 %
Lymphs Abs: 1.2 10*3/uL (ref 0.7–4.0)
MCH: 31.5 pg (ref 26.0–34.0)
MCHC: 34.7 g/dL (ref 30.0–36.0)
MCV: 90.8 fL (ref 80.0–100.0)
Monocytes Absolute: 0.6 10*3/uL (ref 0.1–1.0)
Monocytes Relative: 3 %
Neutro Abs: 16.6 10*3/uL — ABNORMAL HIGH (ref 1.7–7.7)
Neutrophils Relative %: 88 %
Platelets: 345 10*3/uL (ref 150–400)
RBC: 5.2 MIL/uL (ref 4.22–5.81)
RDW: 12.8 % (ref 11.5–15.5)
WBC: 18.7 10*3/uL — ABNORMAL HIGH (ref 4.0–10.5)
nRBC: 0 % (ref 0.0–0.2)

## 2019-02-27 LAB — D-DIMER, QUANTITATIVE: D-Dimer, Quant: 3.85 ug/mL-FEU — ABNORMAL HIGH (ref 0.00–0.50)

## 2019-02-27 LAB — COMPREHENSIVE METABOLIC PANEL
ALT: 72 U/L — ABNORMAL HIGH (ref 0–44)
AST: 43 U/L — ABNORMAL HIGH (ref 15–41)
Albumin: 3.1 g/dL — ABNORMAL LOW (ref 3.5–5.0)
Alkaline Phosphatase: 47 U/L (ref 38–126)
Anion gap: 9 (ref 5–15)
BUN: 33 mg/dL — ABNORMAL HIGH (ref 8–23)
CO2: 29 mmol/L (ref 22–32)
Calcium: 8.1 mg/dL — ABNORMAL LOW (ref 8.9–10.3)
Chloride: 97 mmol/L — ABNORMAL LOW (ref 98–111)
Creatinine, Ser: 0.78 mg/dL (ref 0.61–1.24)
GFR calc Af Amer: >60 mL/min (ref >60)
GFR calc non Af Amer: >60 mL/min (ref >60)
Glucose, Bld: 82 mg/dL (ref 70–99)
Potassium: 4 mmol/L (ref 3.5–5.1)
Sodium: 135 mmol/L (ref 135–145)
Total Bilirubin: 1 mg/dL (ref 0.3–1.2)
Total Protein: 6.2 g/dL — ABNORMAL LOW (ref 6.5–8.1)

## 2019-02-27 LAB — GLUCOSE, CAPILLARY
Glucose-Capillary: 116 mg/dL — ABNORMAL HIGH (ref 70–99)
Glucose-Capillary: 88 mg/dL (ref 70–99)
Glucose-Capillary: 82 mg/dL (ref 70–99)
Glucose-Capillary: 232 mg/dL — ABNORMAL HIGH (ref 70–99)
Glucose-Capillary: 203 mg/dL — ABNORMAL HIGH (ref 70–99)
Glucose-Capillary: 177 mg/dL — ABNORMAL HIGH (ref 70–99)

## 2019-02-27 LAB — PHOSPHORUS: Phosphorus: 3.4 mg/dL (ref 2.5–4.6)

## 2019-02-27 LAB — C-REACTIVE PROTEIN: CRP: <0.8 mg/dL (ref <1.0)

## 2019-02-27 LAB — MAGNESIUM: Magnesium: 2.3 mg/dL (ref 1.7–2.4)

## 2019-02-27 MED ORDER — FUROSEMIDE 10 MG/ML IJ SOLN
20.0000 mg | Freq: Once | INTRAMUSCULAR | Status: AC
  Administered 2019-02-27: 20 mg via INTRAVENOUS
  Filled 2019-02-27: qty 2

## 2019-02-27 NOTE — Progress Notes (Signed)
NAME:  Ronald Arnold, MRN:  [id number], DOB:  Apr 01, 1956, LOS: 39 ADMISSION DATE:  02/17/2019, CONSULTATION DATE:  02/18/19 REFERRING MD:  Florene Glen, CHIEF COMPLAINT:  Dyspnea  Brief History   63 y/o male admitted for COVID 19 pneumonia causing acute respiratory failure with hypxoemia on 8/1.  Initially admitted on [facility], transferred to [facility] on 8/5 when a bed was available.    Past Medical History  Prostate cancer s/p prostatectomy 2015 DVT 2015  Significant Hospital Events   8/1 admission 8/2 placed in ICU, heated high flow, prone positioning 8/5 moved to [facility] ICU  Consults:  PCCM  Procedures:    Significant Diagnostic Tests:    Micro Data:  7/27 SARS COV2 positive BC x 2 8/1 >> ng  MRSA screen 8/2 neg  Antimicrobials/COVID treatment:  8/1 decadron >  8/1, 8/4 tocilizumab  8/1>8/5 remdesivir   Interim history/subjective:   Proned this morning.  Objective   Blood pressure 117/75, pulse (!) 102, temperature 98 F (36.7 C), temperature source Oral, resp. rate (!) 21, height 5\' 10"  (1.778 m), weight 90.1 kg, SpO2 96 %.    FiO2 (%):  [30 %-50 %] 40 %   Intake/Output Summary (Last 24 hours) at 02/27/2019 1838 Last data filed at 02/27/2019 1600 Gross per 24 hour  Intake 1200 ml  Output 2500 ml  Net -1300 ml   Filed Weights   02/25/19 0500 02/26/19 0500 02/27/19 0500  Weight: 90.9 kg 91.3 kg 90.1 kg   Physical Exam: General: Well-appearing, no acute distress, on heated high flow HENT: [place], AT, OP clear, MMM Eyes: EOMI, no scleral icterus Respiratory: Clear to auscultation bilaterally.  No crackles, wheezing or rales Cardiovascular: RRR, -M/R/G, no JVD GI: BS+, soft, nontender Extremities:-Edema,-tenderness Neuro: AAO x4, CNII-XII grossly intact Skin: Intact, no rashes or bruising Psych: Normal mood, normal affect  Resolved Hospital Problem list     Assessment & Plan:   Acute hypoxemic respiratory failure secondary to COVID-19: High risk for intubation Wean  heated high flow. May possibly transition to HFNC tomorrow Maintain O2 saturations for goal SpO2 >85% Monitor closely for signs of ventilatory failure such as nasal flaring, accessory muscle use, abdominal paradoxical movements. Self-proning as tolerated  Solumedrol 0.5-1 mg/kg daily or Decadron 6 mg IV/PO daily  x 10 days Pulmonary hygiene including bronchodilator and flutter valve. AVOID manual percussion. Mobilize as tolerated Diurese  Best practice:  Diet: regular diet Pain/Anxiety/Delirium protocol (if indicated): n/a VAP protocol (if indicated): n/a DVT prophylaxis: lovenox bid per COVID protocol GI prophylaxis: n/a Glucose control: monitor, per TRH Mobility: out of bed to chair today Code Status: full Family Communication: Updated daughter on 8/11 Disposition: remain in ICU  Labs   CBC: Recent Labs  Lab 02/23/19 0038 02/24/19 0055 02/25/19 0018 02/26/19 0555 02/27/19 0930  WBC 8.4 10.8* 11.4* 19.0* 18.7*  NEUTROABS 6.8 9.0* 10.4* 17.4* 16.6*  HGB 15.3 16.6 16.3 15.6 16.4  HCT 45.2 49.0 46.8 44.7 47.2  MCV 90.9 91.6 90.0 90.1 90.8  PLT 343 393 374 356 841    Basic Metabolic Panel: Recent Labs  Lab 02/23/19 0038 02/24/19 0055 02/25/19 0018 02/26/19 0555 02/27/19 0930  NA 134* 133* 135 132* 135  K 4.1 3.9 3.7 4.0 4.0  CL 99 93* 93* 93* 97*  CO2 25 26 27 28 29   GLUCOSE 164* 127* 163* 114* 82  BUN 34* 33* 34* 34* 33*  CREATININE 0.91 0.99 1.05 0.93 0.78  CALCIUM 8.3* 8.7* 8.7* 8.4* 8.1*  MG 2.2  2.1 2.0 2.1 2.3  PHOS 3.8 3.8 5.1* 4.4 3.4   GFR: Estimated Creatinine Clearance: 109.4 mL/min (by C-G formula based on SCr of 0.78 mg/dL). Recent Labs  Lab 02/24/19 0055 02/25/19 0018 02/26/19 0555 02/27/19 0930  WBC 10.8* 11.4* 19.0* 18.7*    Liver Function Tests: Recent Labs  Lab 02/23/19 0038 02/24/19 0055 02/25/19 0018 02/26/19 0555 02/27/19 0930  AST 51* 52* 47* 41 43*  ALT 71* 85* 80* 68* 72*  ALKPHOS 48 51 47 46 47  BILITOT 0.9 1.0 0.8  0.8 1.0  PROT 6.4* 7.0 6.4* 5.9* 6.2*  ALBUMIN 3.2* 3.5 3.1* 2.9* 3.1*   No results for input(s): LIPASE, AMYLASE in the last 168 hours. No results for input(s): AMMONIA in the last 168 hours.  ABG    Component Value Date/Time   PHART 7.467 (H) 02/17/2019 2340   PCO2ART 34.8 02/17/2019 2340   PO2ART 78.8 (L) 02/17/2019 2340   HCO3 24.1 02/17/2019 2340   TCO2 26 07/04/2014 1527   O2SAT 94.0 02/17/2019 2340     Coagulation Profile: No results for input(s): INR, PROTIME in the last 168 hours.  Cardiac Enzymes: No results for input(s): CKTOTAL, CKMB, CKMBINDEX, TROPONINI in the last 168 hours.  HbA1C: Hgb A1c MFr Bld  Date/Time Value Ref Range Status  02/20/2019 02:25 AM 6.2 (H) 4.8 - 5.6 % Final    Comment:    (NOTE) Pre diabetes:          5.7%-6.4% Diabetes:              >6.4% Glycemic control for   <7.0% adults with diabetes     CBG: Recent Labs  Lab 02/26/19 2348 02/27/19 0352 02/27/19 0736 02/27/19 1135 02/27/19 1644  GLUCAP 141* 116* 88 82 232*     Critical care time: 31 minutes    The patient is critically ill with multiple organ systems failure and requires high complexity decision making for assessment and support, frequent evaluation and titration of therapies, application of advanced monitoring technologies and extensive interpretation of multiple databases.   Discussed and co-managed patient care with PCCM-Hospitalist. Coordinated care with RT, RN and pharmacist.  Rodman Pickle, M.D. [facility] Pulmonary/Critical Care Medicine 02/27/2019 6:38 PM  Pager: [phone] After hours pager: [phone]

## 2019-02-27 NOTE — Progress Notes (Signed)
Spoke with Katherine(daughter) with pt updates.  All questions answered at this time.

## 2019-02-27 NOTE — Progress Notes (Signed)
PROGRESS NOTE    Ronald Arnold  GDJ:[id number] DOB: 10-03-1955 DOA: 02/17/2019 PCP: Lawerance Cruel, MD   Brief Narrative:  Ronald Paskett Simkinsis a 63 y.o.WM PMHx prostate cancer status post surgery 5 years ago,history of DVT 2015 treated with Xarelto, HTN controlled wo meds, HLD.  Presents complaining of worsening shortness of breath. Patient currently day 10 of symptoms for COVID-19. He was diagnosed with cough with on Monday (7/27), at [facility] clinic.He presents complaining of worsening shortness of breath, cough. His oxygen dropped yesterday. He was able to be on 3 L of oxygen at home, his PCP for help arrange oxygen at home.  He denies chest pain, abdominal pain, diarrhea.   Evaluation in the ED: Patient temperature 101, oxygen saturation 92 on 2 L.Sodium 133 chloride 97, BUN 18, creatinine 1.93, calcium 8.0, albumin 3.0, AST 60, LDH 344, ferritin 711, CRP 9.6, procalcitonin 0.3 lactic acid 1.8, white blood cell 7.5, hemoglobin 13,d-dimer 0.9, fibrinogen 626. Chest x-ray:Worsened lung aeration with an increase in bilateral hazy airspace opacities consistent with multifocal pneumonia, pattern consistent with the diagnosis of COVID-19.   Subjective: 8/11 A/O x4, positive S OB, negative N/V    Assessment & Plan:   Principal Problem:   Pneumonia due to COVID-19 virus Active Problems:   Lower respiratory tract infection due to COVID-19 virus   HTN (hypertension)   HLD (hyperlipidemia)   Acute respiratory failure with hypoxia (HCC)   Hypoxia   Prediabetes   CAP (community acquired pneumonia) due to Pneumococcus ([place])   Acute Respiratory Failure with Hypoxia, COVID Pneumonia -Dx positive COVID 19 at your outpatient clinic. - Continue HHFNC.  Titrate to maintain SPO2> 90%.  However will tolerate permissive hypoxia when patient is active and as long as patient does not have AMS. - 8/8 Solu-Medrol 60 mg TID--> Decadron 6 mg daily - Xopenex 3 times daily -Ipratropium 3  times daily - Remdesivir  per pharmacy protocol - 8/4 Actemra  - Vitamins per COVID protocol. - Currently patient doing well on HHFNC. -Patient self proning.  Usually sleeps the whole night in prone position.  - Daily COVID inflammatory markers pending Recent Labs  Lab 02/23/19 0038 02/24/19 0055 02/25/19 0018 02/26/19 0555 02/27/19 0930  CRP 1.2* 1.0* <0.8 <0.8 <0.8   Recent Labs  Lab 02/23/19 0038 02/24/19 0055 02/25/19 0018 02/26/19 0555 02/27/19 0930  DDIMER 11.52* 13.60* 9.93* 6.45* 3.85*  -Inflammatory markers decreasing -Continue to wean O2 as patient tolerates.  SPO2 goal> 89%  CAP - Complete course of antibiotics - See acute respiratory failure  Essential HTN - Per patient at home no need for BP medication. - Continue amlodipine 5 mg daily(HOLD) - Hydralazine PRN(HOLD) - Hydrochlorothiazide 12.5 mg daily(HOLD) - Strict in and out -16.6 L -Daily weight Filed Weights   02/25/19 0500 02/26/19 0500 02/27/19 0500  Weight: 90.9 kg 91.3 kg 90.1 kg  -Continue diuresing patient.  Monitor electrolytes and renal function closely - 8/11 Lasix IV 20 mg  Elevated LFTs - Monitor closely Recent Labs  Lab 02/23/19 0038 02/24/19 0055 02/25/19 0018 02/26/19 0555 02/27/19 0930  AST 51* 52* 47* 41 43*   Recent Labs  Lab 02/23/19 0038 02/24/19 0055 02/25/19 0018 02/26/19 0555 02/27/19 0930  ALT 71* 85* 80* 68* 72*  -Continue to improve  Prediabetes -8/4 hemoglobin A1c= 6.4 - Most likely exacerbated by steroids -8/5 metformin 500 mg daily - 8/6 increase moderate SSI   HLD - 8/6 lipid panel not within ADA/AHA guidelines - LDL goal<70 -  Lipitor 20 mg daily  Hemoptysis -Per previous note patient was noted to have hemoptysis.  Monitor closely -Monitor H/H closely Recent Labs  Lab 02/23/19 0038 02/24/19 0055 02/25/19 0018 02/26/19 0555 02/27/19 0930  HGB 15.3 16.6 16.3 15.6 16.4  -Hemoglobin stable  Hx prostate cancer -S/p surgery 5 years ago.     DVT prophylaxis: Lovenox 40 mg BID code Status: Full Family Communication: .  Spoke with Ronald Arnold (daughter) went over plan of care answered all questions Disposition Plan: TBD   Consultants:  PCCM  Procedures/Significant Events:     I have personally reviewed and interpreted all radiology studies and my findings are as above.  VENTILATOR SETTINGS:* HFNC O2 flow rate: 30 L/min FiO2: 50% SPO2: 97% (No change)    Cultures 8/1 blood RIGHT hand negative final 8/1 blood left arm negative final 8/1 acute hepatitis panel negative 8/1 HIV negative 8/2 MRSA by PCR negative    Antimicrobials: Anti-infectives (From admission, onward)   Start     Stop   02/26/19 1400  cefTRIAXone (ROCEPHIN) 2 g in sodium chloride 0.9 % 100 mL IVPB     03/03/19 1359   02/18/19 1800  remdesivir 100 mg in sodium chloride 0.9 % 250 mL IVPB     02/21/19 1840   02/17/19 1530  remdesivir 200 mg in sodium chloride 0.9 % 250 mL IVPB     02/17/19 1808       Devices    LINES / TUBES:      Continuous Infusions: . cefTRIAXone (ROCEPHIN)  IV 2 g (02/27/19 1307)     Objective: Vitals:   02/27/19 1800 02/27/19 1816 02/27/19 1900 02/27/19 1944  BP: 117/75  102/83   Pulse: (!) 102  (!) 108 (!) 105  Resp: (!) 23 (!) 21 (!) 32 (!) 24  Temp:    98 F (36.7 C)  TempSrc:    Oral  SpO2: 100% 96% 95% 99%  Weight:      Height:        Intake/Output Summary (Last 24 hours) at 02/27/2019 1958 Last data filed at 02/27/2019 1934 Gross per 24 hour  Intake 1200 ml  Output 2900 ml  Net -1700 ml   Filed Weights   02/25/19 0500 02/26/19 0500 02/27/19 0500  Weight: 90.9 kg 91.3 kg 90.1 kg   Physical Exam:  General: A/O x4, positive acute respiratory distress Eyes: negative scleral hemorrhage, negative anisocoria, negative icterus ENT: Negative Runny nose, negative gingival bleeding, Neck:  Negative scars, masses, torticollis, lymphadenopathy, JVD Lungs: Clear to auscultation  bilaterally without wheezes or crackles Cardiovascular: Regular rate and rhythm without murmur gallop or rub normal S1 and S2 Abdomen: negative abdominal pain, nondistended, positive soft, bowel sounds, no rebound, no ascites, no appreciable mass Extremities: No significant cyanosis, clubbing, or edema bilateral lower extremities Skin: Negative rashes, lesions, ulcers Psychiatric:  Negative depression, negative anxiety, negative fatigue, negative mania  Central nervous system:  Cranial nerves II through XII intact, tongue/uvula midline, all extremities muscle strength 5/5, sensation intact throughout, negative dysarthria, negative expressive aphasia, negative receptive aphasia.         Data Reviewed: Care during the described time interval was provided by me .  I have reviewed this patient's available data, including medical history, events of note, physical examination, and all test results as part of my evaluation.   CBC: Recent Labs  Lab 02/23/19 0038 02/24/19 0055 02/25/19 0018 02/26/19 0555 02/27/19 0930  WBC 8.4 10.8* 11.4* 19.0* 18.7*  NEUTROABS 6.8 9.0*  10.4* 17.4* 16.6*  HGB 15.3 16.6 16.3 15.6 16.4  HCT 45.2 49.0 46.8 44.7 47.2  MCV 90.9 91.6 90.0 90.1 90.8  PLT 343 393 374 356 174   Basic Metabolic Panel: Recent Labs  Lab 02/23/19 0038 02/24/19 0055 02/25/19 0018 02/26/19 0555 02/27/19 0930  NA 134* 133* 135 132* 135  K 4.1 3.9 3.7 4.0 4.0  CL 99 93* 93* 93* 97*  CO2 25 26 27 28 29   GLUCOSE 164* 127* 163* 114* 82  BUN 34* 33* 34* 34* 33*  CREATININE 0.91 0.99 1.05 0.93 0.78  CALCIUM 8.3* 8.7* 8.7* 8.4* 8.1*  MG 2.2 2.1 2.0 2.1 2.3  PHOS 3.8 3.8 5.1* 4.4 3.4   GFR: Estimated Creatinine Clearance: 109.4 mL/min (by C-G formula based on SCr of 0.78 mg/dL). Liver Function Tests: Recent Labs  Lab 02/23/19 0038 02/24/19 0055 02/25/19 0018 02/26/19 0555 02/27/19 0930  AST 51* 52* 47* 41 43*  ALT 71* 85* 80* 68* 72*  ALKPHOS 48 51 47 46 47  BILITOT 0.9  1.0 0.8 0.8 1.0  PROT 6.4* 7.0 6.4* 5.9* 6.2*  ALBUMIN 3.2* 3.5 3.1* 2.9* 3.1*   No results for input(s): LIPASE, AMYLASE in the last 168 hours. No results for input(s): AMMONIA in the last 168 hours. Coagulation Profile: No results for input(s): INR, PROTIME in the last 168 hours. Cardiac Enzymes: No results for input(s): CKTOTAL, CKMB, CKMBINDEX, TROPONINI in the last 168 hours. BNP (last 3 results) No results for input(s): PROBNP in the last 8760 hours. HbA1C: No results for input(s): HGBA1C in the last 72 hours. CBG: Recent Labs  Lab 02/26/19 2348 02/27/19 0352 02/27/19 0736 02/27/19 1135 02/27/19 1644  GLUCAP 141* 116* 88 82 232*   Lipid Profile: No results for input(s): CHOL, HDL, LDLCALC, TRIG, CHOLHDL, LDLDIRECT in the last 72 hours. Thyroid Function Tests: No results for input(s): TSH, T4TOTAL, FREET4, T3FREE, THYROIDAB in the last 72 hours. Anemia Panel: No results for input(s): VITAMINB12, FOLATE, FERRITIN, TIBC, IRON, RETICCTPCT in the last 72 hours. Urine analysis: No results found for: COLORURINE, APPEARANCEUR, LABSPEC, PHURINE, GLUCOSEU, HGBUR, BILIRUBINUR, KETONESUR, PROTEINUR, UROBILINOGEN, NITRITE, LEUKOCYTESUR Sepsis Labs: @LABRCNTIP (procalcitonin:4,lacticidven:4)  ) Recent Results (from the past 240 hour(s))  MRSA PCR Screening     Status: None   Collection Time: 02/18/19  1:05 PM   Specimen: Nasal Mucosa; Nasopharyngeal  Result Value Ref Range Status   MRSA by PCR NEGATIVE NEGATIVE Final    Comment:        The GeneXpert MRSA Assay (FDA approved for NASAL specimens only), is one component of a comprehensive MRSA colonization surveillance program. It is not intended to diagnose MRSA infection nor to guide or monitor treatment for MRSA infections. Performed at [facility], [place] [address], [place], [place] [address]          Radiology Studies: No results found.      Scheduled Meds: . atorvastatin  20 mg Oral  q1800  . Chlorhexidine Gluconate Cloth  6 each Topical Q0600  . cholecalciferol  2,000 Units Oral Daily  . dexamethasone  6 mg Oral Daily  . enoxaparin (LOVENOX) injection  40 mg Subcutaneous Q12H  . feeding supplement  1 Container Oral TID WC  . fluticasone  1 spray Each Nare Daily  . insulin aspart  0-15 Units Subcutaneous Q4H  . ipratropium  2 puff Inhalation Q6H  . levalbuterol  2 puff Inhalation Q6H  . loratadine  10 mg Oral Daily  . mouth rinse  15  mL Mouth Rinse BID  . metFORMIN  500 mg Oral Q breakfast  . multivitamin with minerals  1 tablet Oral Daily  . traZODone  50 mg Oral QHS  . vitamin C  1,000 mg Oral Daily  . zinc sulfate  220 mg Oral Daily   Continuous Infusions: . cefTRIAXone (ROCEPHIN)  IV 2 g (02/27/19 1307)     LOS: 10 days   The patient is critically ill with multiple organ systems failure and requires high complexity decision making for assessment and support, frequent evaluation and titration of therapies, application of advanced monitoring technologies and extensive interpretation of multiple databases. Critical Care Time devoted to patient care services described in this note  Time spent: 40 minutes     Phillipe Clemon, Geraldo Docker, MD [facility] Pager [phone]  If 7PM-7AM, please contact night-coverage [email or web address] Password [email or web address] 02/27/2019, 7:58 PM

## 2019-02-27 NOTE — TOC Progression Note (Addendum)
Transition of Care [facility]) - Progression Note    Patient Details  Name: Ronald Arnold MRN: [id number] Date of Birth: September 11, 1955  Transition of [facility] CM/SW Contact  Maryclare Labrador, RN Phone Number: 02/27/2019, 11:55 AM  Clinical Narrative:   PTA independent from home with wife - pt has been on 3 liters home oxygen since original dx of COVID (supplied by [employer]).  Pt remains on HFNC and critically ill.  Pt will require PT/OT eval once stable.  TOC will continue to follow       Barriers to Discharge: Other (comment)(COVID positive and still needing continued medical work up)  Expected Discharge Plan and Services         Living arrangements for the past 2 months: Single Family Home                                       Social Determinants of Health (SDOH) Interventions    Readmission Risk Interventions No flowsheet data found.

## 2019-02-28 LAB — COMPREHENSIVE METABOLIC PANEL
ALT: 77 U/L — ABNORMAL HIGH (ref 0–44)
AST: 36 U/L (ref 15–41)
Albumin: 2.9 g/dL — ABNORMAL LOW (ref 3.5–5.0)
Alkaline Phosphatase: 41 U/L (ref 38–126)
Anion gap: 11 (ref 5–15)
BUN: 35 mg/dL — ABNORMAL HIGH (ref 8–23)
CO2: 28 mmol/L (ref 22–32)
Calcium: 8.3 mg/dL — ABNORMAL LOW (ref 8.9–10.3)
Chloride: 97 mmol/L — ABNORMAL LOW (ref 98–111)
Creatinine, Ser: 0.75 mg/dL (ref 0.61–1.24)
GFR calc Af Amer: >60 mL/min (ref >60)
GFR calc non Af Amer: >60 mL/min (ref >60)
Glucose, Bld: 103 mg/dL — ABNORMAL HIGH (ref 70–99)
Potassium: 4.1 mmol/L (ref 3.5–5.1)
Sodium: 136 mmol/L (ref 135–145)
Total Bilirubin: 0.8 mg/dL (ref 0.3–1.2)
Total Protein: 5.9 g/dL — ABNORMAL LOW (ref 6.5–8.1)

## 2019-02-28 LAB — CBC WITH DIFFERENTIAL/PLATELET
Abs Immature Granulocytes: 0.14 10*3/uL — ABNORMAL HIGH (ref 0.00–0.07)
Basophils Absolute: 0 10*3/uL (ref 0.0–0.1)
Basophils Relative: 0 %
Eosinophils Absolute: 0.1 10*3/uL (ref 0.0–0.5)
Eosinophils Relative: 0 %
HCT: 45.9 % (ref 39.0–52.0)
Hemoglobin: 15.7 g/dL (ref 13.0–17.0)
Immature Granulocytes: 1 %
Lymphocytes Relative: 7 %
Lymphs Abs: 1.2 10*3/uL (ref 0.7–4.0)
MCH: 31.3 pg (ref 26.0–34.0)
MCHC: 34.2 g/dL (ref 30.0–36.0)
MCV: 91.6 fL (ref 80.0–100.0)
Monocytes Absolute: 0.7 10*3/uL (ref 0.1–1.0)
Monocytes Relative: 4 %
Neutro Abs: 15.3 10*3/uL — ABNORMAL HIGH (ref 1.7–7.7)
Neutrophils Relative %: 88 %
Platelets: 302 10*3/uL (ref 150–400)
RBC: 5.01 MIL/uL (ref 4.22–5.81)
RDW: 13.2 % (ref 11.5–15.5)
WBC: 17.5 10*3/uL — ABNORMAL HIGH (ref 4.0–10.5)
nRBC: 0 % (ref 0.0–0.2)

## 2019-02-28 LAB — PHOSPHORUS: Phosphorus: 3.4 mg/dL (ref 2.5–4.6)

## 2019-02-28 LAB — MAGNESIUM: Magnesium: 2.2 mg/dL (ref 1.7–2.4)

## 2019-02-28 LAB — GLUCOSE, CAPILLARY
Glucose-Capillary: 109 mg/dL — ABNORMAL HIGH (ref 70–99)
Glucose-Capillary: 136 mg/dL — ABNORMAL HIGH (ref 70–99)
Glucose-Capillary: 191 mg/dL — ABNORMAL HIGH (ref 70–99)
Glucose-Capillary: 201 mg/dL — ABNORMAL HIGH (ref 70–99)
Glucose-Capillary: 218 mg/dL — ABNORMAL HIGH (ref 70–99)
Glucose-Capillary: 93 mg/dL (ref 70–99)

## 2019-02-28 LAB — C-REACTIVE PROTEIN: CRP: <0.8 mg/dL (ref <1.0)

## 2019-02-28 LAB — D-DIMER, QUANTITATIVE: D-Dimer, Quant: 3.3 ug/mL-FEU — ABNORMAL HIGH (ref 0.00–0.50)

## 2019-02-28 MED ORDER — POLYETHYLENE GLYCOL 3350 17 G PO PACK
17.0000 g | PACK | Freq: Every day | ORAL | Status: DC
  Administered 2019-02-28 – 2019-03-04 (×5): 17 g via ORAL
  Filled 2019-02-28 (×4): qty 1

## 2019-02-28 MED ORDER — FUROSEMIDE 10 MG/ML IJ SOLN
20.0000 mg | Freq: Once | INTRAMUSCULAR | Status: AC
  Administered 2019-02-28: 20 mg via INTRAVENOUS
  Filled 2019-02-28: qty 2

## 2019-02-28 MED ORDER — SENNOSIDES-DOCUSATE SODIUM 8.6-50 MG PO TABS: 2.0000 | ORAL_TABLET | Freq: Two times a day (BID) | ORAL | Status: DC | PRN

## 2019-02-28 NOTE — Progress Notes (Signed)
[facility] Pulmonary Note  Primary team transferred patient to floor on [place]. I updated daughter on transfer. PCCM will sign off.  Rodman Pickle, M.D. [facility] Pulmonary/Critical Care Medicine 02/28/2019 12:24 PM

## 2019-02-28 NOTE — Progress Notes (Signed)
Called and updated pt's daughter, Belenda Cruise, via phone. I told her he will be moved down to Progressive once a bed is available at which I will pass along her contact information.  She was very grateful for the update and condition of her father.

## 2019-02-28 NOTE — Progress Notes (Signed)
Spoke with Belenda Cruise. No changes on the patient. She didn't have any questions.

## 2019-02-28 NOTE — Progress Notes (Signed)
[facility] Progress Note  Patient: Ronald Arnold WLN:[id number]   PCP: Lawerance Cruel, MD DOB: February 24, 1956   DOA: 02/17/2019   DOS: 02/28/2019   Date of Service: the patient was seen and examined on 02/28/2019  Brief hospital course: Pt. with PMH of  prostate cancer status post surgery 5 years ago,history of DVT 2015 treated with Xarelto, HTN controlled wo meds, HLD; admitted on 02/17/2019, presented with complaint of shortness of breath, was found to have acute COVID-19 infection. Admitted on 02/17/2019 started on Decadron, Remdesivir, Actemra.  Transferred to ICU on 02/18/2019 on heated high flow.  Currently completing Decadron and he is on 5 LPM Currently further plan is transferred to progressive care unit and monitor response.  Subjective: Feeling better.  No acute complaint.  No nausea no vomiting.  Reports constipated.  No diarrhea.  Oral intake adequate.  Assessment and Plan: 1.  Acute COVID-19 Viral illness No results found for: SARSCOV2NAA CXR: hazy bilateral peripheral opacities  Recent Labs    02/26/19 0555 02/27/19 0930 02/28/19 0635  DDIMER 6.45* 3.85* 3.30*  CRP <0.8 <0.8 <0.8    Tmax last 24 hours: Afebrile Oxygen requirements: 5 LPM  Antibiotics: Ceftriaxone day 3, will complete for total 5 days.  Last day 03/02/2019. Diuretics: Lasix 20 mg daily IV Vitamin C and Zinc: Continue DVT Prophylaxis: Subcutaneous Lovenox  Remdesivir: Completed 5-day course Steroids: On day 13.  Will stop tomorrow. Actemra: Received on 02/17/2019 and 02/20/2019 off-label use of Actemra: This patient has confirmed COVID-19 in the setting of the ongoing 2020 coronavirus pandemic.  He has hypoxia and is high-risk for intubation,  but expected to survive >48 hours and has good baseline functional status.  he is not known to be on immunomodulators, anti-rejection medications, or cancer chemotherapy, has no history of TB or latent TB, and no history of diverticulitis or intestinal perforation.   Platelets are >50K, ANC is >500, and ALT/AST are below 5x ULN with no known hepatitis B infection. The investigational nature of this medication was discussed with the patient/HCPOA and they choose to proceed as the potential benefits are felt to outweigh risks at this time.  -Tocilizumab 8 mg/kg now -Monitor for infusion reaction  Prone positioning: Patient encouraged to stay in prone position as much as possible.  PPE During this encounter: Patient Isolation: Airborne + Droplet + Contact HCP PPE: CAPR, gown. gloves Patient PPE: None  The treatment plan and use of medications and known side effects were discussed with patient/family. It was clearly explained that there is no proven definitive treatment for COVID-19 infection yet. Any medications used here are based on case reports/anecdotal data which are not peer-reviewed and has not been studied using randomized control trials.  Complete risks and long-term side effects are unknown, however in the best clinical judgment they seem to be of some clinical benefit rather than medical risks.  Patient/family agree with the treatment plan and want to receive these treatments as indicated.   2.  Constipation. Continue stool softener.  3.  Community-acquired pneumonia. We will complete 7-day treatment course with antibiotics. So far no growth anywhere else.  4.  Essential hypertension. Patient is on multiple antihypertensive medications at home currently on hold.  5.  Prediabetes. Hyperglycemia Patient was started on metformin. We will currently hold for now given his hospital stay. Continue sliding scale insulin.  6.  Dyslipidemia. Elevated LFT. Started on Lipitor 20 mg daily. Currently LFTs are getting better. Monitor while on statin.  7.  History  of prostate cancer. S/P surgery 5 years ago. Currently no active issues with retention.  Diet: Carb modified diet DVT Prophylaxis: Subcutaneous Lovenox  Advance goals of care  discussion: Full code  Family Communication: no family was present at bedside, at the time of interview. The pt provided permission to discuss medical plan with the family. Opportunity was given to ask question and all questions were answered satisfactorily.   Disposition:  Discharge to Home 1 to 2 days.  Consultants: PCCM  Procedures: none  Scheduled Meds: . atorvastatin  20 mg Oral q1800  . Chlorhexidine Gluconate Cloth  6 each Topical Q0600  . cholecalciferol  2,000 Units Oral Daily  . dexamethasone  6 mg Oral Daily  . enoxaparin (LOVENOX) injection  40 mg Subcutaneous Q12H  . feeding supplement  1 Container Oral TID WC  . fluticasone  1 spray Each Nare Daily  . insulin aspart  0-15 Units Subcutaneous Q4H  . ipratropium  2 puff Inhalation Q6H  . levalbuterol  2 puff Inhalation Q6H  . loratadine  10 mg Oral Daily  . mouth rinse  15 mL Mouth Rinse BID  . multivitamin with minerals  1 tablet Oral Daily  . polyethylene glycol  17 g Oral Daily  . traZODone  50 mg Oral QHS  . vitamin C  1,000 mg Oral Daily  . zinc sulfate  220 mg Oral Daily   Continuous Infusions: . cefTRIAXone (ROCEPHIN)  IV Stopped (02/28/19 1439)   PRN Meds: acetaminophen **OR** acetaminophen, albuterol, benzonatate, chlorpheniramine-HYDROcodone, hydrALAZINE, HYDROcodone-acetaminophen, labetalol, ondansetron **OR** ondansetron (ZOFRAN) IV, senna-docusate Antibiotics: Anti-infectives (From admission, onward)   Start     Dose/Rate Route Frequency Ordered Stop   02/26/19 1400  cefTRIAXone (ROCEPHIN) 2 g in sodium chloride 0.9 % 100 mL IVPB     2 g 200 mL/hr over 30 Minutes Intravenous Every 24 hours 02/26/19 1248 03/03/19 1359   02/18/19 1800  remdesivir 100 mg in sodium chloride 0.9 % 250 mL IVPB     100 mg 500 mL/hr over 30 Minutes Intravenous Every 24 hours 02/17/19 1355 02/21/19 1840   02/17/19 1530  remdesivir 200 mg in sodium chloride 0.9 % 250 mL IVPB     200 mg 500 mL/hr over 30 Minutes Intravenous  Once 02/17/19 1336 02/17/19 1808       Objective: Physical Exam: Vitals:   02/28/19 1414 02/28/19 1500 02/28/19 1518 02/28/19 1620  BP: 112/67 99/64 99/64  112/80  Pulse: 96 100 89 91  Resp: (!) 25 (!) 25 16 20   Temp:    97.6 F (36.4 C)  TempSrc:    Oral  SpO2: 100% 100% 100% 99%  Weight:      Height:        Intake/Output Summary (Last 24 hours) at 02/28/2019 1740 Last data filed at 02/28/2019 1439 Gross per 24 hour  Intake 697.46 ml  Output 1875 ml  Net -1177.54 ml   Filed Weights   02/26/19 0500 02/27/19 0500 02/28/19 0438  Weight: 91.3 kg 90.1 kg 89.8 kg   General: alert and oriented to time, place, and person. Appear in mild distress, affect appropriate Eyes: PERRL, Conjunctiva normal ENT: Oral Mucosa Clear, moist  Neck: no JVD, no Abnormal Mass Or lumps Cardiovascular: S1 and S2 Present, no Murmur, peripheral pulses symmetrical Respiratory: normal respiratory effort, Bilateral Air entry equal and Decreased, no use of accessory muscle, bilateral  Crackles, no wheezes Abdomen: Bowel Sound present, Soft and no tenderness, no hernia Skin: no rashes Extremities: no Pedal edema,  no calf tenderness Neurologic: normal without focal findings, mental status, speech normal, alert and oriented x3, PERLA, Motor strength 5/5 and symmetric and sensation grossly normal to light touch Gait not checked due to patient safety concerns  Data Reviewed: CBC: Recent Labs  Lab 02/24/19 0055 02/25/19 0018 02/26/19 0555 02/27/19 0930 02/28/19 0635  WBC 10.8* 11.4* 19.0* 18.7* 17.5*  NEUTROABS 9.0* 10.4* 17.4* 16.6* 15.3*  HGB 16.6 16.3 15.6 16.4 15.7  HCT 49.0 46.8 44.7 47.2 45.9  MCV 91.6 90.0 90.1 90.8 91.6  PLT 393 374 356 345 286   Basic Metabolic Panel: Recent Labs  Lab 02/24/19 0055 02/25/19 0018 02/26/19 0555 02/27/19 0930 02/28/19 0635  NA 133* 135 132* 135 136  K 3.9 3.7 4.0 4.0 4.1  CL 93* 93* 93* 97* 97*  CO2 26 27 28 29 28   GLUCOSE 127* 163* 114* 82 103*   BUN 33* 34* 34* 33* 35*  CREATININE 0.99 1.05 0.93 0.78 0.75  CALCIUM 8.7* 8.7* 8.4* 8.1* 8.3*  MG 2.1 2.0 2.1 2.3 2.2  PHOS 3.8 5.1* 4.4 3.4 3.4    Liver Function Tests: Recent Labs  Lab 02/24/19 0055 02/25/19 0018 02/26/19 0555 02/27/19 0930 02/28/19 0635  AST 52* 47* 41 43* 36  ALT 85* 80* 68* 72* 77*  ALKPHOS 51 47 46 47 41  BILITOT 1.0 0.8 0.8 1.0 0.8  PROT 7.0 6.4* 5.9* 6.2* 5.9*  ALBUMIN 3.5 3.1* 2.9* 3.1* 2.9*   No results for input(s): LIPASE, AMYLASE in the last 168 hours. No results for input(s): AMMONIA in the last 168 hours. Coagulation Profile: No results for input(s): INR, PROTIME in the last 168 hours. Cardiac Enzymes: No results for input(s): CKTOTAL, CKMB, CKMBINDEX, TROPONINI in the last 168 hours. BNP (last 3 results) No results for input(s): PROBNP in the last 8760 hours. CBG: Recent Labs  Lab 02/28/19 0009 02/28/19 0435 02/28/19 0750 02/28/19 1133 02/28/19 1554  GLUCAP 136* 109* 93 191* 201*   Studies: No results found.   Time spent: 35 minutes  Author: Berle Mull, MD [facility] 02/28/2019 5:40 PM  To reach On-call, see care teams to locate the attending and reach out to them via [email or web address]. If 7PM-7AM, please contact night-coverage If you still have difficulty reaching the attending provider, please page the [facility] (Director on Call) for [facility] on amion for assistance.

## 2019-02-28 NOTE — Progress Notes (Signed)
Report called to Abby, RN on [phone].

## 2019-03-01 LAB — CBC WITH DIFFERENTIAL/PLATELET
Abs Immature Granulocytes: 0.16 10*3/uL — ABNORMAL HIGH (ref 0.00–0.07)
Basophils Absolute: 0 10*3/uL (ref 0.0–0.1)
Basophils Relative: 0 %
Eosinophils Absolute: 0 10*3/uL (ref 0.0–0.5)
Eosinophils Relative: 0 %
HCT: 45.1 % (ref 39.0–52.0)
Hemoglobin: 15.4 g/dL (ref 13.0–17.0)
Immature Granulocytes: 1 %
Lymphocytes Relative: 5 %
Lymphs Abs: 0.8 10*3/uL (ref 0.7–4.0)
MCH: 31.6 pg (ref 26.0–34.0)
MCHC: 34.1 g/dL (ref 30.0–36.0)
MCV: 92.6 fL (ref 80.0–100.0)
Monocytes Absolute: 0.8 10*3/uL (ref 0.1–1.0)
Monocytes Relative: 5 %
Neutro Abs: 14 10*3/uL — ABNORMAL HIGH (ref 1.7–7.7)
Neutrophils Relative %: 89 %
Platelets: 309 10*3/uL (ref 150–400)
RBC: 4.87 MIL/uL (ref 4.22–5.81)
RDW: 13 % (ref 11.5–15.5)
WBC: 15.8 10*3/uL — ABNORMAL HIGH (ref 4.0–10.5)
nRBC: 0 % (ref 0.0–0.2)

## 2019-03-01 LAB — COMPREHENSIVE METABOLIC PANEL
ALT: 80 U/L — ABNORMAL HIGH (ref 0–44)
AST: 35 U/L (ref 15–41)
Albumin: 3 g/dL — ABNORMAL LOW (ref 3.5–5.0)
Alkaline Phosphatase: 39 U/L (ref 38–126)
Anion gap: 15 (ref 5–15)
BUN: 30 mg/dL — ABNORMAL HIGH (ref 8–23)
CO2: 24 mmol/L (ref 22–32)
Calcium: 8.6 mg/dL — ABNORMAL LOW (ref 8.9–10.3)
Chloride: 97 mmol/L — ABNORMAL LOW (ref 98–111)
Creatinine, Ser: 0.78 mg/dL (ref 0.61–1.24)
GFR calc Af Amer: >60 mL/min (ref >60)
GFR calc non Af Amer: >60 mL/min (ref >60)
Glucose, Bld: 69 mg/dL — ABNORMAL LOW (ref 70–99)
Potassium: 3.7 mmol/L (ref 3.5–5.1)
Sodium: 136 mmol/L (ref 135–145)
Total Bilirubin: 0.5 mg/dL (ref 0.3–1.2)
Total Protein: 6 g/dL — ABNORMAL LOW (ref 6.5–8.1)

## 2019-03-01 LAB — PHOSPHORUS: Phosphorus: 3.9 mg/dL (ref 2.5–4.6)

## 2019-03-01 LAB — MAGNESIUM: Magnesium: 2 mg/dL (ref 1.7–2.4)

## 2019-03-01 LAB — GLUCOSE, CAPILLARY
Glucose-Capillary: 106 mg/dL — ABNORMAL HIGH (ref 70–99)
Glucose-Capillary: 160 mg/dL — ABNORMAL HIGH (ref 70–99)
Glucose-Capillary: 161 mg/dL — ABNORMAL HIGH (ref 70–99)
Glucose-Capillary: 167 mg/dL — ABNORMAL HIGH (ref 70–99)
Glucose-Capillary: 281 mg/dL — ABNORMAL HIGH (ref 70–99)
Glucose-Capillary: 81 mg/dL (ref 70–99)

## 2019-03-01 LAB — D-DIMER, QUANTITATIVE: D-Dimer, Quant: 3.11 ug/mL-FEU — ABNORMAL HIGH (ref 0.00–0.50)

## 2019-03-01 LAB — C-REACTIVE PROTEIN: CRP: <0.8 mg/dL (ref <1.0)

## 2019-03-01 MED ORDER — INSULIN ASPART 100 UNIT/ML ~~LOC~~ SOLN
0.0000 [IU] | Freq: Three times a day (TID) | SUBCUTANEOUS | Status: DC
  Administered 2019-03-01: 3 [IU] via SUBCUTANEOUS
  Administered 2019-03-02 (×2): 5 [IU] via SUBCUTANEOUS
  Administered 2019-03-03: 3 [IU] via SUBCUTANEOUS
  Administered 2019-03-04: 2 [IU] via SUBCUTANEOUS

## 2019-03-01 MED ORDER — ALBUTEROL SULFATE HFA 108 (90 BASE) MCG/ACT IN AERS
1.0000 | INHALATION_SPRAY | RESPIRATORY_TRACT | Status: DC | PRN
  Filled 2019-03-01: qty 6.7

## 2019-03-01 MED ORDER — INSULIN ASPART 100 UNIT/ML ~~LOC~~ SOLN
0.0000 [IU] | Freq: Every day | SUBCUTANEOUS | Status: DC
  Administered 2019-03-01 – 2019-03-02 (×2): 3 [IU] via SUBCUTANEOUS

## 2019-03-01 NOTE — Progress Notes (Signed)
[place] TEAM 1 - Stepdown/ICU TEAM  Ronald Arnold  [id number]:[id number] DOB: [birth year] DOA: 02/17/2019 PCP: Lawerance Cruel, MD    Brief Narrative:  63 year old with a history of prostate cancer status post surgery 5 years ago, DVT in 2015 treated with Xarelto, HTN, and HLD who was admitted 8/1 with complaints of shortness of breath.  He was found to have pneumonia consistent with COVID-19.  Following his admission on 8/1 treatment was initiated with Decadron, Remdesivir, and Actemra.  He required transfer to the ICU 8/2 to utilize heated high flow nasal cannula oxygen.  He has not required intubation and has done well on heated high flow.  Significant Events: [facility] 8/2 transfer to [facility] ICU - PCCM consult - heated high flow - prone positioning 8/5 transferred to [facility] ICU 8/12 transfer to PCU  COVID-19 specific Treatment: Decadron 8/1 > Remdesivir 8/1 > 8/5 Actemra 8/1 and 8/4  Subjective: The patient is resting comfortably in a bedside chair.  He states he feels much better today.  He still feels somewhat short of breath but feels that this is improving.  He denies chest pain nausea vomiting or abdominal pain.  Assessment & Plan:  COVID Pneumonia Has been treated with Actemra, Remdesivir, and a 14-day course of steroids -continue to wean oxygen support as able -mobilize with PT/OT -incentive spirometer  Recent Labs    02/27/19 0930 02/28/19 0635 03/01/19 0115  DDIMER 3.85* 3.30* 3.11*  CRP <0.8 <0.8 <0.8    Community-acquired pneumonia To complete 5 days of ceftriaxone -clinically improving  HTN Blood pressure currently well controlled  Prediabetes - hyperglycemia A1c 6.2 -CBG now adequately controlled -follow trend  HLD Continue home medical therapy  History of prostate cancer Asymptomatic at present  DVT prophylaxis: Lovenox Code Status: FULL CODE Family Communication:  Disposition Plan:   Consultants:  PCCM  Antimicrobials:   Rocephin 8/10 >  Objective: Blood pressure 105/67, pulse (!) 101, temperature 97.7 F (36.5 C), resp. rate 19, height 5\' 10"  (1.778 m), weight 91.1 kg, SpO2 92 %.  Intake/Output Summary (Last 24 hours) at 03/01/2019 0828 Last data filed at 03/01/2019 0600 Gross per 24 hour  Intake 1297.46 ml  Output 2675 ml  Net -1377.54 ml   Filed Weights   02/27/19 0500 02/28/19 0438 03/01/19 0416  Weight: 90.1 kg 89.8 kg 91.1 kg    Examination: General: No acute respiratory distress Lungs: Fine basilar crackles bilaterally with no wheezing Cardiovascular: Regular rate and rhythm without murmur gallop or rub normal S1 and S2 Abdomen: Nontender, nondistended, overweight, soft, bowel sounds positive, no rebound, no ascites, no appreciable mass Extremities: 1+ edema bilateral lower extremities  CBC: Recent Labs  Lab 02/27/19 0930 02/28/19 0635 03/01/19 0115  WBC 18.7* 17.5* 15.8*  NEUTROABS 16.6* 15.3* 14.0*  HGB 16.4 15.7 15.4  HCT 47.2 45.9 45.1  MCV 90.8 91.6 92.6  PLT 345 302 967   Basic Metabolic Panel: Recent Labs  Lab 02/27/19 0930 02/28/19 0635 03/01/19 0115  NA 135 136 136  K 4.0 4.1 3.7  CL 97* 97* 97*  CO2 29 28 24   GLUCOSE 82 103* 69*  BUN 33* 35* 30*  CREATININE 0.78 0.75 0.78  CALCIUM 8.1* 8.3* 8.6*  MG 2.3 2.2 2.0  PHOS 3.4 3.4 3.9   GFR: Estimated Creatinine Clearance: 110 mL/min (by C-G formula based on SCr of 0.78 mg/dL).  Liver Function Tests: Recent Labs  Lab 02/26/19 0555 02/27/19 0930 02/28/19 5916 03/01/19  0115  AST 41 43* 36 35  ALT 68* 72* 77* 80*  ALKPHOS 46 47 41 39  BILITOT 0.8 1.0 0.8 0.5  PROT 5.9* 6.2* 5.9* 6.0*  ALBUMIN 2.9* 3.1* 2.9* 3.0*    HbA1C: Hgb A1c MFr Bld  Date/Time Value Ref Range Status  02/20/2019 02:25 AM 6.2 (H) 4.8 - 5.6 % Final    Comment:    (NOTE) Pre diabetes:          5.7%-6.4% Diabetes:              >6.4% Glycemic control for   <7.0% adults with diabetes     CBG: Recent Labs  Lab 02/28/19 1554  02/28/19 2145 02/28/19 2358 03/01/19 0419 03/01/19 0738  GLUCAP 201* 218* 161* 106* 81     Scheduled Meds: . atorvastatin  20 mg Oral q1800  . Chlorhexidine Gluconate Cloth  6 each Topical Q0600  . cholecalciferol  2,000 Units Oral Daily  . dexamethasone  6 mg Oral Daily  . enoxaparin (LOVENOX) injection  40 mg Subcutaneous Q12H  . feeding supplement  1 Container Oral TID WC  . fluticasone  1 spray Each Nare Daily  . insulin aspart  0-15 Units Subcutaneous Q4H  . ipratropium  2 puff Inhalation Q6H  . levalbuterol  2 puff Inhalation Q6H  . loratadine  10 mg Oral Daily  . mouth rinse  15 mL Mouth Rinse BID  . multivitamin with minerals  1 tablet Oral Daily  . polyethylene glycol  17 g Oral Daily  . traZODone  50 mg Oral QHS  . vitamin C  1,000 mg Oral Daily  . zinc sulfate  220 mg Oral Daily   Continuous Infusions: . cefTRIAXone (ROCEPHIN)  IV Stopped (02/28/19 1439)     LOS: 12 days   Cherene Altes, MD [facility] Office  [phone] Pager - Text Page per Amion  If 7PM-7AM, please contact night-coverage per Amion 03/01/2019, 8:28 AM

## 2019-03-02 LAB — COMPREHENSIVE METABOLIC PANEL
ALT: 82 U/L — ABNORMAL HIGH (ref 0–44)
AST: 33 U/L (ref 15–41)
Albumin: 3.1 g/dL — ABNORMAL LOW (ref 3.5–5.0)
Alkaline Phosphatase: 40 U/L (ref 38–126)
Anion gap: 12 (ref 5–15)
BUN: 30 mg/dL — ABNORMAL HIGH (ref 8–23)
CO2: 26 mmol/L (ref 22–32)
Calcium: 8.6 mg/dL — ABNORMAL LOW (ref 8.9–10.3)
Chloride: 98 mmol/L (ref 98–111)
Creatinine, Ser: 0.79 mg/dL (ref 0.61–1.24)
GFR calc Af Amer: >60 mL/min (ref >60)
GFR calc non Af Amer: >60 mL/min (ref >60)
Glucose, Bld: 103 mg/dL — ABNORMAL HIGH (ref 70–99)
Potassium: 4.5 mmol/L (ref 3.5–5.1)
Sodium: 136 mmol/L (ref 135–145)
Total Bilirubin: 0.7 mg/dL (ref 0.3–1.2)
Total Protein: 5.8 g/dL — ABNORMAL LOW (ref 6.5–8.1)

## 2019-03-02 LAB — CBC WITH DIFFERENTIAL/PLATELET
Abs Immature Granulocytes: 0.17 10*3/uL — ABNORMAL HIGH (ref 0.00–0.07)
Basophils Absolute: 0 10*3/uL (ref 0.0–0.1)
Basophils Relative: 0 %
Eosinophils Absolute: 0 10*3/uL (ref 0.0–0.5)
Eosinophils Relative: 0 %
HCT: 44.5 % (ref 39.0–52.0)
Hemoglobin: 14.8 g/dL (ref 13.0–17.0)
Immature Granulocytes: 1 %
Lymphocytes Relative: 6 %
Lymphs Abs: 0.9 10*3/uL (ref 0.7–4.0)
MCH: 31 pg (ref 26.0–34.0)
MCHC: 33.3 g/dL (ref 30.0–36.0)
MCV: 93.3 fL (ref 80.0–100.0)
Monocytes Absolute: 0.9 10*3/uL (ref 0.1–1.0)
Monocytes Relative: 6 %
Neutro Abs: 12.8 10*3/uL — ABNORMAL HIGH (ref 1.7–7.7)
Neutrophils Relative %: 87 %
Platelets: 266 10*3/uL (ref 150–400)
RBC: 4.77 MIL/uL (ref 4.22–5.81)
RDW: 13.1 % (ref 11.5–15.5)
WBC: 14.8 10*3/uL — ABNORMAL HIGH (ref 4.0–10.5)
nRBC: 0 % (ref 0.0–0.2)

## 2019-03-02 LAB — GLUCOSE, CAPILLARY
Glucose-Capillary: 79 mg/dL (ref 70–99)
Glucose-Capillary: 211 mg/dL — ABNORMAL HIGH (ref 70–99)
Glucose-Capillary: 234 mg/dL — ABNORMAL HIGH (ref 70–99)
Glucose-Capillary: 257 mg/dL — ABNORMAL HIGH (ref 70–99)

## 2019-03-02 LAB — FERRITIN: Ferritin: 1178 ng/mL — ABNORMAL HIGH (ref 24–336)

## 2019-03-02 LAB — C-REACTIVE PROTEIN: CRP: <0.8 mg/dL (ref <1.0)

## 2019-03-02 LAB — D-DIMER, QUANTITATIVE: D-Dimer, Quant: 2.96 ug/mL-FEU — ABNORMAL HIGH (ref 0.00–0.50)

## 2019-03-02 MED ORDER — SALINE SPRAY 0.65 % NA SOLN
1.0000 | NASAL | Status: DC | PRN
  Administered 2019-03-03: 1 via NASAL
  Filled 2019-03-02: qty 44

## 2019-03-02 NOTE — Progress Notes (Addendum)
OT Evaluation  PTA, pt independent with ADL and mobility and works as an Psychologist, sport and exercise at [employer]. Pt overall set up with ADL. Able to complete ADL tasks and mobility with SpO2 above 90 on RA. Educated on energy conservation strategies. Written handout provided. Pt left on RA with SpO2 @ 94.  Pt will have assistance from his family at DC. Pt talking in detail about his ICU experience and his plan to return to work/experience with Covid. Recommend pt discuss plans to return to work with his physician. No DME needs or further OT needs. Pt very appreciative.     03/02/19 1700  OT Visit Information  Last OT Received On 03/02/19  Assistance Needed +1  History of Present Illness 63 year old with a history of prostate cancer status post surgery 5 years ago, DVT in 2015 treated with Xarelto, HTN, and HLD who was admitted 8/1 with complaints of shortness of breath.  He was found to have pneumonia consistent with COVID-19. Following his admission on 8/1 treatment was initiated with Decadron, Remdesivir, and Actemra.  He required transfer to the ICU 8/2 to utilize heated high flow nasal cannula oxygen.  He has not required intubation and has done well on heated high flow.  Precautions  Precautions Other (comment)  Precaution Comments sats  Home Living  Family/patient expects to be discharged to: Private residence  Living Arrangements Spouse/significant other  Available Help at Discharge Family (wife, also COVID +)  Type of [place] to enter  Entrance Stairs-Number of Steps 2  [place] One level  Print production planner Handicapped height  Bathroom Accessibility Yes (with RW)  How Accessible Accessible via walker  [place] - built in  Additional Comments shower seat is suitable height and depth  Prior Function  Level of Independence Independent  Comments Scientist, 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 [employer] Other (comment) (SOB with talking)  Pain Assessment  Pain Assessment No/denies pain  Cognition  Arousal/Alertness Awake/alert  Behavior During Therapy WFL for tasks assessed/performed  Overall Cognitive Status Within Functional Limits for tasks assessed  General Comments Lengthy time talking about psychological affect of beoingint he ICU and experiences  Upper Extremity Assessment  Upper Extremity Assessment Generalized weakness  Lower Extremity Assessment  Lower Extremity Assessment Defer to PT evaluation  Cervical / Trunk Assessment  Cervical / Trunk Assessment Other exceptions  Cervical / Trunk Exceptions obese  ADL  Overall ADL's  Needs assistance/impaired  Functional mobility during ADLs Supervision/safety  General ADL Comments Pt overall set up for ADL tasks. Educated pt on energy conservation stategies and handout provided. Pt has a walk in shower with shower chair he can use. Pt verbalized understanding of energy conservation strategies.   Vision- History  Baseline Vision/History Wears glasses  Transfers  Overall transfer level Needs assistance  Equipment used None  Transfers Sit to/from Stand  Sit to Stand Supervision  Balance  Overall balance assessment Mild deficits observed, not formally tested  Exercises  Exercises Other exercises  Other Exercises  Other Exercises Issued level 2 theraband with general UE strengthening ex  Other Exercises chair marching x 20  OT - End of Session  Activity Tolerance Patient tolerated treatment well  Patient left in chair;with call bell/phone within reach  Nurse Communication Mobility status;Other (comment) (monitor O2; left on RA)  OT Assessment  OT Recommendation/Assessment Patient does not need any further OT services  OT Visit Diagnosis  Unsteadiness on feet (R26.81);Muscle weakness (generalized) (M62.81)  OT Problem List Decreased activity tolerance;Decreased knowledge of use of DME or AE;Cardiopulmonary status  limiting activity;Obesity  AM-PAC OT "6 Clicks" Daily Activity Outcome Measure (Version 2)  Help from another person eating meals? 4  Help from another person taking care of personal grooming? 4  Help from another person toileting, which includes using toliet, bedpan, or urinal? 3  Help from another person bathing (including washing, rinsing, drying)? 3  Help from another person to put on and taking off regular upper body clothing? 3  Help from another person to put on and taking off regular lower body clothing? 3  6 Click Score 20  OT Recommendation  Follow Up Recommendations No OT follow up;Supervision - Intermittent  OT Equipment None recommended by OT  Acute Rehab OT Goals  Patient Stated Goal take his time getting better and back to work  OT Goal Formulation All assessment and education complete, DC therapy  OT Time Calculation  OT Start Time (ACUTE ONLY) 1545  OT Stop Time (ACUTE ONLY) 1618  OT Time Calculation (min) 33 min  OT General Charges  $OT Visit 1 Visit  OT Evaluation  $OT Eval Moderate Complexity 1 Mod  OT Treatments  $Self Care/Home Management  8-22 mins  Maurie Boettcher, OT/L   Acute OT Clinical Specialist [place] Pager [phone] Office [phone]

## 2019-03-02 NOTE — Progress Notes (Signed)
Phoned Pt's daughter, advised that pt has removed from the Cardiac Monitor, and changed from HFNC to regular [place].  Advised that Pt should be up with PT/OT today walking in the hall, and possible D/C to home in the next couple of days or so.

## 2019-03-02 NOTE — Progress Notes (Signed)
[place] TEAM 1 - Stepdown/ICU TEAM  Ronald Arnold  UMP:[id number] DOB: 1956/05/27 DOA: 02/17/2019 PCP: Lawerance Cruel, MD    Brief Narrative:  63 year old with a history of prostate cancer status post surgery 5 years ago, DVT in 2015 treated with Xarelto, HTN, and HLD who was admitted 8/1 with complaints of shortness of breath.  He was found to have pneumonia consistent with COVID-19.  Following his admission on 8/1 treatment was initiated with Decadron, Remdesivir, and Actemra.  He required transfer to the ICU 8/2 to utilize heated high flow nasal cannula oxygen.  He has not required intubation and has done well on heated high flow.  Significant Events: [facility] 8/2 [facility] to [facility] ICU - PCCM consult - heated high flow - prone positioning 8/5 transferred to [facility] ICU 8/12 transfer to PCU  COVID-19 specific Treatment: Decadron 8/1 > 8/14 Remdesivir 8/1 > 8/5 Actemra 8/1 and 8/4  Subjective: Patient is sitting up in a bedside chair.  He has no new complaints today.  He looks much better.  He denies chest pain nausea vomiting abdominal pain.  His shortness of breath is much improved.  His oxygen requirement has decreased.  Assessment & Plan:  COVID Pneumonia Has been treated with Actemra, Remdesivir, and steroids - continue to wean oxygen support as able -mobilize with PT/OT -incentive spirometer -possible discharge in 24 to 40 hours  Recent Labs  Lab 02/26/19 0555 02/27/19 0930 02/28/19 0635 03/01/19 0115 03/02/19 0227  DDIMER 6.45* 3.85* 3.30* 3.11* 2.96*  FERRITIN  --   --   --   --  1,178*  CRP <0.8 <0.8 <0.8 <0.8 <0.8  ALT 68* 72* 77* 80* 82*    Community-acquired pneumonia To complete 5 days of ceftriaxone today - clinically improving  HTN Blood pressure currently well controlled  Prediabetes - hyperglycemia A1c 6.2 - stopping steroid today  HLD Continue home medical therapy  History of prostate cancer Asymptomatic at present   DVT prophylaxis: Lovenox Code Status: FULL CODE Family Communication:  Disposition Plan: PT/OT pending -probable discharge 24-48 hours  Consultants:  PCCM  Antimicrobials:  Rocephin 8/10 > 8/14  Objective: Blood pressure 123/84, pulse 89, temperature 97.8 F (36.6 C), resp. rate 19, height 5\' 10"  (1.778 m), weight 84.7 kg, SpO2 95 %.  Intake/Output Summary (Last 24 hours) at 03/02/2019 0857 Last data filed at 03/01/2019 1903 Gross per 24 hour  Intake 1360 ml  Output -  Net 1360 ml   Filed Weights   02/28/19 0438 03/01/19 0416 03/02/19 0500  Weight: 89.8 kg 91.1 kg 84.7 kg    Examination: General: No acute respiratory distress at rest Lungs: Fine basilar crackles bilaterally w-no wheezing Cardiovascular: RRR without murmur Abdomen: Overweight, soft, no rebound Extremities: 1+ edema bilateral lower extremities without change  CBC: Recent Labs  Lab 02/28/19 0635 03/01/19 0115 03/02/19 0227  WBC 17.5* 15.8* 14.8*  NEUTROABS 15.3* 14.0* 12.8*  HGB 15.7 15.4 14.8  HCT 45.9 45.1 44.5  MCV 91.6 92.6 93.3  PLT 302 309 400   Basic Metabolic Panel: Recent Labs  Lab 02/27/19 0930 02/28/19 0635 03/01/19 0115 03/02/19 0227  NA 135 136 136 136  K 4.0 4.1 3.7 4.5  CL 97* 97* 97* 98  CO2 29 28 24 26   GLUCOSE 82 103* 69* 103*  BUN 33* 35* 30* 30*  CREATININE 0.78 0.75 0.78 0.79  CALCIUM 8.1* 8.3* 8.6* 8.6*  MG 2.3 2.2 2.0  --  PHOS 3.4 3.4 3.9  --    GFR: Estimated Creatinine Clearance: 100.1 mL/min (by C-G formula based on SCr of 0.79 mg/dL).  Liver Function Tests: Recent Labs  Lab 02/27/19 0930 02/28/19 0635 03/01/19 0115 03/02/19 0227  AST 43* 36 35 33  ALT 72* 77* 80* 82*  ALKPHOS 47 41 39 40  BILITOT 1.0 0.8 0.5 0.7  PROT 6.2* 5.9* 6.0* 5.8*  ALBUMIN 3.1* 2.9* 3.0* 3.1*    HbA1C: Hgb A1c MFr Bld  Date/Time Value Ref Range Status  02/20/2019 02:25 AM 6.2 (H) 4.8 - 5.6 % Final    Comment:    (NOTE) Pre diabetes:          5.7%-6.4% Diabetes:               >6.4% Glycemic control for   <7.0% adults with diabetes     CBG: Recent Labs  Lab 03/01/19 0419 03/01/19 0738 03/01/19 1122 03/01/19 1638 03/01/19 2023  GLUCAP 106* 81 167* 160* 281*     Scheduled Meds: . atorvastatin  20 mg Oral q1800  . Chlorhexidine Gluconate Cloth  6 each Topical Q0600  . cholecalciferol  2,000 Units Oral Daily  . dexamethasone  6 mg Oral Daily  . enoxaparin (LOVENOX) injection  40 mg Subcutaneous Q12H  . feeding supplement  1 Container Oral TID WC  . fluticasone  1 spray Each Nare Daily  . insulin aspart  0-15 Units Subcutaneous TID WC  . insulin aspart  0-5 Units Subcutaneous QHS  . ipratropium  2 puff Inhalation Q6H  . levalbuterol  2 puff Inhalation Q6H  . loratadine  10 mg Oral Daily  . mouth rinse  15 mL Mouth Rinse BID  . multivitamin with minerals  1 tablet Oral Daily  . polyethylene glycol  17 g Oral Daily  . traZODone  50 mg Oral QHS  . vitamin C  1,000 mg Oral Daily  . zinc sulfate  220 mg Oral Daily   Continuous Infusions: . cefTRIAXone (ROCEPHIN)  IV 2 g (03/01/19 1453)     LOS: 13 days   Cherene Altes, MD [facility] Office  [phone] Pager - Text Page per Amion  If 7PM-7AM, please contact night-coverage per Amion 03/02/2019, 8:57 AM

## 2019-03-02 NOTE — Evaluation (Signed)
Physical Therapy Evaluation Patient Details Name: Ronald Arnold MRN: [id number] DOB: 1955/07/22 Today's Date: 03/02/2019   History of Present Illness  63 year old with a history of prostate cancer status post surgery 5 years ago, DVT in 2015 treated with Xarelto, HTN, and HLD who was admitted 8/1 with complaints of shortness of breath.  He was found to have pneumonia consistent with COVID-19. Following his admission on 8/1 treatment was initiated with Decadron, Remdesivir, and Actemra.  He required transfer to the ICU 8/2 to utilize heated high flow nasal cannula oxygen.  He has not required intubation and has done well on heated high flow.  Clinical Impression   Pt admitted with above diagnosis. Patient mobilizing well considering prolonged hospital stay (13 days). Slight imbalance when walking and required vc to stop and allow sats to return to >88% on 2L. Anticipate discharge home over the weekend and explained importance of continued walking program. Pt currently with functional limitations due to the deficits listed below (see PT Problem List). Pt will benefit from skilled PT to increase their independence and safety with mobility to allow discharge to the venue listed below.       Follow Up Recommendations No PT follow up    Equipment Recommendations  None recommended by PT    Recommendations for Other Services OT consult     Precautions / Restrictions Precautions Precautions: Other (comment) Precaution Comments: sats      Mobility  Bed Mobility                  Transfers Overall transfer level: Needs assistance Equipment used: None Transfers: Sit to/from Stand Sit to Stand: Supervision         General transfer comment: for lines and safety  Ambulation/Gait Ambulation/Gait assistance: Min guard;Supervision Gait Distance (Feet): 500 Feet Assistive device: None Gait Pattern/deviations: Step-through pattern;Decreased stride length;Wide base of support Gait  velocity: decr   General Gait Details: vc to slow pace; twice cued to stop and do PLB to incr sats (lowest 87% on 2L)  Stairs            Wheelchair Mobility    Modified Rankin (Stroke Patients Only)       Balance Overall balance assessment: Mild deficits observed, not formally tested                                           Pertinent Vitals/Pain Pain Assessment: No/denies pain    Home Living Family/patient expects to be discharged to:: Private residence Living Arrangements: Spouse/significant other Available Help at Discharge: Family(wife, also COVID +) Type of Home: House Home Access: Stairs to enter Entrance Stairs-Rails: None Entrance Stairs-Number of Steps: 2 Home Layout: One level Home Equipment: Shower seat - built in Additional Comments: shower seat is suitable height and depth    Prior Function Level of Independence: Independent         Comments: Scientist, 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 [employer]        Extremity/Trunk Assessment   Upper Extremity Assessment Upper Extremity Assessment: Defer to OT evaluation    Lower Extremity Assessment Lower Extremity Assessment: Generalized weakness    Cervical / Trunk Assessment Cervical / Trunk Assessment: Other exceptions Cervical / Trunk Exceptions: obese  Communication   Communication: Other (comment)(SOB with talking)  Cognition Arousal/Alertness: Awake/alert Behavior During Therapy: WFL for tasks assessed/performed Overall Cognitive Status:  Within Functional Limits for tasks assessed                                        General Comments General comments (skin integrity, edema, etc.): slight drift and independent recovery while walking x 1    Exercises Other Exercises Other Exercises: educated on proper use of IS x 5 breaths (max 1250 ml); educated on proper PLB Other Exercises: Need for continued walking program when home; likely will need O2 for activity  even if does not need at rest.   Assessment/Plan    PT Assessment Patient needs continued PT services  PT Problem List Decreased strength;Decreased balance;Decreased mobility;Cardiopulmonary status limiting activity       PT Treatment Interventions Gait training;Functional mobility training;Therapeutic activities;Therapeutic exercise;Balance training;Patient/family education    PT Goals (Current goals can be found in the Care Plan section)  Acute Rehab PT Goals Patient Stated Goal: take his time getting better and back to work PT Goal Formulation: With patient Time For Goal Achievement: 03/09/19 Potential to Achieve Goals: Good    Frequency Min 3X/week   Barriers to discharge        Co-evaluation               AM-PAC PT "6 Clicks" Mobility  Outcome Measure Help needed turning from your back to your side while in a flat bed without using bedrails?: None Help needed moving from lying on your back to sitting on the side of a flat bed without using bedrails?: None Help needed moving to and from a bed to a chair (including a wheelchair)?: A Little Help needed standing up from a chair using your arms (e.g., wheelchair or bedside chair)?: A Little Help needed to walk in hospital room?: A Little Help needed climbing 3-5 steps with a railing? : A Little 6 Click Score: 20    End of Session Equipment Utilized During Treatment: Oxygen Activity Tolerance: Patient tolerated treatment well Patient left: in chair;with call bell/phone within reach Nurse Communication: Mobility status;Other (comment)(had been removing O2 to get to bathroom (with help); desats) PT Visit Diagnosis: Unsteadiness on feet (R26.81);Muscle weakness (generalized) (M62.81)    Time: 2355-7322 PT Time Calculation (min) (ACUTE ONLY): 52 min   Charges:   PT Evaluation $PT Eval Low Complexity: 1 Low PT Treatments $Gait Training: 23-37 mins          Barry [place], PT      Lewis P Sameerah Nachtigal 03/02/2019,  3:02 PM

## 2019-03-02 NOTE — Plan of Care (Signed)

## 2019-03-03 LAB — CBC WITH DIFFERENTIAL/PLATELET
Abs Immature Granulocytes: 0.18 10*3/uL — ABNORMAL HIGH (ref 0.00–0.07)
Basophils Absolute: 0 10*3/uL (ref 0.0–0.1)
Basophils Relative: 0 %
Eosinophils Absolute: 0 10*3/uL (ref 0.0–0.5)
Eosinophils Relative: 0 %
HCT: 45 % (ref 39.0–52.0)
Hemoglobin: 15.3 g/dL (ref 13.0–17.0)
Immature Granulocytes: 1 %
Lymphocytes Relative: 7 %
Lymphs Abs: 1.1 10*3/uL (ref 0.7–4.0)
MCH: 31.4 pg (ref 26.0–34.0)
MCHC: 34 g/dL (ref 30.0–36.0)
MCV: 92.4 fL (ref 80.0–100.0)
Monocytes Absolute: 1.2 10*3/uL — ABNORMAL HIGH (ref 0.1–1.0)
Monocytes Relative: 7 %
Neutro Abs: 14.7 10*3/uL — ABNORMAL HIGH (ref 1.7–7.7)
Neutrophils Relative %: 85 %
Platelets: 285 10*3/uL (ref 150–400)
RBC: 4.87 MIL/uL (ref 4.22–5.81)
RDW: 13.2 % (ref 11.5–15.5)
WBC: 17.3 10*3/uL — ABNORMAL HIGH (ref 4.0–10.5)
nRBC: 0 % (ref 0.0–0.2)

## 2019-03-03 LAB — COMPREHENSIVE METABOLIC PANEL
ALT: 85 U/L — ABNORMAL HIGH (ref 0–44)
AST: 29 U/L (ref 15–41)
Albumin: 3.3 g/dL — ABNORMAL LOW (ref 3.5–5.0)
Alkaline Phosphatase: 47 U/L (ref 38–126)
Anion gap: 11 (ref 5–15)
BUN: 27 mg/dL — ABNORMAL HIGH (ref 8–23)
CO2: 26 mmol/L (ref 22–32)
Calcium: 8.8 mg/dL — ABNORMAL LOW (ref 8.9–10.3)
Chloride: 98 mmol/L (ref 98–111)
Creatinine, Ser: 0.84 mg/dL (ref 0.61–1.24)
GFR calc Af Amer: >60 mL/min (ref >60)
GFR calc non Af Amer: >60 mL/min (ref >60)
Glucose, Bld: 102 mg/dL — ABNORMAL HIGH (ref 70–99)
Potassium: 4.5 mmol/L (ref 3.5–5.1)
Sodium: 135 mmol/L (ref 135–145)
Total Bilirubin: 0.9 mg/dL (ref 0.3–1.2)
Total Protein: 6.5 g/dL (ref 6.5–8.1)

## 2019-03-03 LAB — C-REACTIVE PROTEIN: CRP: <0.8 mg/dL (ref <1.0)

## 2019-03-03 LAB — D-DIMER, QUANTITATIVE: D-Dimer, Quant: 2.68 ug/mL-FEU — ABNORMAL HIGH (ref 0.00–0.50)

## 2019-03-03 LAB — GLUCOSE, CAPILLARY
Glucose-Capillary: 79 mg/dL (ref 70–99)
Glucose-Capillary: 199 mg/dL — ABNORMAL HIGH (ref 70–99)
Glucose-Capillary: 109 mg/dL — ABNORMAL HIGH (ref 70–99)
Glucose-Capillary: 157 mg/dL — ABNORMAL HIGH (ref 70–99)

## 2019-03-03 LAB — FERRITIN: Ferritin: 995 ng/mL — ABNORMAL HIGH (ref 24–336)

## 2019-03-03 NOTE — Progress Notes (Signed)
[place] TEAM 1 - Stepdown/ICU TEAM  Ronald Arnold  BSW:[id number] DOB: 05-23-1956 DOA: 02/17/2019 PCP: Lawerance Cruel, MD    Brief Narrative:  63 year old with a history of prostate cancer status post surgery 5 years ago, DVT in 2015 treated with Xarelto, HTN, and HLD who was admitted 8/1 with complaints of shortness of breath.  He was found to have pneumonia consistent with COVID-19.  Following his admission on 8/1 treatment was initiated with Decadron, Remdesivir, and Actemra.  He required transfer to the ICU 8/2 to utilize heated high flow nasal cannula oxygen.  He has not required intubation and has done well on heated high flow.  Significant Events: [facility] 8/2 transfer to [facility] ICU - PCCM consult - heated high flow - prone positioning 8/5 transferred to [facility] ICU 8/12 transfer to PCU  COVID-19 specific Treatment: Decadron 8/1 > 8/14 Remdesivir 8/1 > 8/5 Actemra 8/1 and 8/4  Subjective: The patient continues to steadily improve.  He reports that he does not feel short of breath when he is sitting still at this time, but does still get short of breath when exerting himself.  He denies chest pain nausea or vomiting.  His appetite is improving.  Assessment & Plan:  COVID Pneumonia Has been treated with Actemra, Remdesivir, and steroids - continue to wean oxygen support as able -mobilize with PT/OT -incentive spirometer -anticipate discharge home with home oxygen 8/16  Recent Labs  Lab 02/27/19 0930 02/28/19 0635 03/01/19 0115 03/02/19 0227 03/03/19 0050  DDIMER 3.85* 3.30* 3.11* 2.96* 2.68*  FERRITIN  --   --   --  1,178* 995*  CRP <0.8 <0.8 <0.8 <0.8 <0.8  ALT 72* 77* 80* 82* 85*    Community-acquired pneumonia Has completed a 5-day course of Rocephin- clinically improving  HTN BP controlled  Prediabetes - hyperglycemia A1c 6.2 -CBG much improved now patient off steroids  HLD Continue home medical therapy  History of prostate  cancer Asymptomatic at present  DVT prophylaxis: Lovenox Code Status: FULL CODE Family Communication:  Disposition Plan: PT/OT -probable discharge on home oxygen 8/16 (of note patient already has oxygen at home)  Consultants:  PCCM  Antimicrobials:  Rocephin 8/10 > 8/14  Objective: Blood pressure 113/79, pulse 85, temperature 98.4 F (36.9 C), temperature source Oral, resp. rate 19, height 5\' 10"  (1.778 m), weight 84.7 kg, SpO2 93 %. No intake or output data in the 24 hours ending 03/03/19 0904 Filed Weights   02/28/19 0438 03/01/19 0416 03/02/19 0500  Weight: 89.8 kg 91.1 kg 84.7 kg    Examination: General: No acute respiratory distress Lungs: Persistent mild bibasilar crackles Cardiovascular: RRR  Abdomen: Overweight, soft, no rebound Extremities: 1+ edema B LE   CBC: Recent Labs  Lab 03/01/19 0115 03/02/19 0227 03/03/19 0050  WBC 15.8* 14.8* 17.3*  NEUTROABS 14.0* 12.8* 14.7*  HGB 15.4 14.8 15.3  HCT 45.1 44.5 45.0  MCV 92.6 93.3 92.4  PLT 309 266 466   Basic Metabolic Panel: Recent Labs  Lab 02/27/19 0930 02/28/19 0635 03/01/19 0115 03/02/19 0227 03/03/19 0050  NA 135 136 136 136 135  K 4.0 4.1 3.7 4.5 4.5  CL 97* 97* 97* 98 98  CO2 29 28 24 26 26   GLUCOSE 82 103* 69* 103* 102*  BUN 33* 35* 30* 30* 27*  CREATININE 0.78 0.75 0.78 0.79 0.84  CALCIUM 8.1* 8.3* 8.6* 8.6* 8.8*  MG 2.3 2.2 2.0  --   --  PHOS 3.4 3.4 3.9  --   --    GFR: Estimated Creatinine Clearance: 95.4 mL/min (by C-G formula based on SCr of 0.84 mg/dL).  Liver Function Tests: Recent Labs  Lab 02/28/19 0635 03/01/19 0115 03/02/19 0227 03/03/19 0050  AST 36 35 33 29  ALT 77* 80* 82* 85*  ALKPHOS 41 39 40 47  BILITOT 0.8 0.5 0.7 0.9  PROT 5.9* 6.0* 5.8* 6.5  ALBUMIN 2.9* 3.0* 3.1* 3.3*    HbA1C: Hgb A1c MFr Bld  Date/Time Value Ref Range Status  02/20/2019 02:25 AM 6.2 (H) 4.8 - 5.6 % Final    Comment:    (NOTE) Pre diabetes:          5.7%-6.4% Diabetes:               >6.4% Glycemic control for   <7.0% adults with diabetes     CBG: Recent Labs  Lab 03/02/19 0754 03/02/19 1208 03/02/19 1709 03/02/19 2113 03/03/19 0754  GLUCAP 79 211* 234* 257* 79     Scheduled Meds: . atorvastatin  20 mg Oral q1800  . Chlorhexidine Gluconate Cloth  6 each Topical Q0600  . cholecalciferol  2,000 Units Oral Daily  . enoxaparin (LOVENOX) injection  40 mg Subcutaneous Q12H  . feeding supplement  1 Container Oral TID WC  . fluticasone  1 spray Each Nare Daily  . insulin aspart  0-15 Units Subcutaneous TID WC  . insulin aspart  0-5 Units Subcutaneous QHS  . ipratropium  2 puff Inhalation Q6H  . levalbuterol  2 puff Inhalation Q6H  . loratadine  10 mg Oral Daily  . mouth rinse  15 mL Mouth Rinse BID  . multivitamin with minerals  1 tablet Oral Daily  . polyethylene glycol  17 g Oral Daily  . traZODone  50 mg Oral QHS  . vitamin C  1,000 mg Oral Daily  . zinc sulfate  220 mg Oral Daily     LOS: 14 days   Cherene Altes, MD [facility] Office  [phone] Pager - Text Page per Amion  If 7PM-7AM, please contact night-coverage per Amion 03/03/2019, 9:04 AM

## 2019-03-03 NOTE — Progress Notes (Signed)
SATURATION QUALIFICATIONS: (This note is used to comply with regulatory documentation for home oxygen)  Patient Saturations on Room Air at Rest = 89%  Patient Saturations on Room Air while Ambulating = 84%  Patient Saturations on 4Liters of oxygen while Ambulating = 93%  Please briefly explain why patient needs home oxygen:

## 2019-03-03 NOTE — Progress Notes (Signed)
Physical Therapy Treatment Patient Details Name: Ronald Arnold MRN: [id number] DOB: September 24, 1955 Today's Date: 03/03/2019    History of Present Illness 63 year old with a history of prostate cancer status post surgery 5 years ago, DVT in 2015 treated with Xarelto, HTN, and HLD who was admitted 8/1 with complaints of shortness of breath.  He was found to have pneumonia consistent with COVID-19. Following his admission on 8/1 treatment was initiated with Decadron, Remdesivir, and Actemra.  He required transfer to the ICU 8/2 to utilize heated high flow nasal cannula oxygen.  He has not required intubation and has done well on heated high flow.    PT Comments    Pt continues to make steady progress. Continues to require supplemental O2 during activity.    Follow Up Recommendations  No PT follow up     Equipment Recommendations  None recommended by PT    Recommendations for Other Services       Precautions / Restrictions Precautions Precautions: Other (comment) Precaution Comments: sats    Mobility  Bed Mobility                  Transfers Overall transfer level: Needs assistance Equipment used: None Transfers: Sit to/from Stand Sit to Stand: Supervision         General transfer comment: for lines and safety  Ambulation/Gait Ambulation/Gait assistance: Supervision Gait Distance (Feet): 500 Feet Assistive device: None Gait Pattern/deviations: Step-through pattern;Decreased stride length;Wide base of support Gait velocity: decr Gait velocity interpretation: 1.31 - 2.62 ft/sec, indicative of limited community ambulator General Gait Details: Assist for lines and to monitor SpO2. Initially on 2L with SpO2 decr to 87%. Incr O2 to 3L and SpO2 stayed 90% or greater   Stairs             Wheelchair Mobility    Modified Rankin (Stroke Patients Only)       Balance Overall balance assessment: Mild deficits observed, not formally tested                                           Cognition Arousal/Alertness: Awake/alert Behavior During Therapy: WFL for tasks assessed/performed Overall Cognitive Status: Within Functional Limits for tasks assessed                                        Exercises      General Comments        Pertinent Vitals/Pain Pain Assessment: No/denies pain    Home Living                      Prior Function            PT Goals (current goals can now be found in the care plan section) Progress towards PT goals: Progressing toward goals    Frequency    Min 3X/week      PT Plan Current plan remains appropriate    Co-evaluation              AM-PAC PT "6 Clicks" Mobility   Outcome Measure  Help needed turning from your back to your side while in a flat bed without using bedrails?: None Help needed moving from lying on your back to sitting on the side of a flat bed without using  bedrails?: None Help needed moving to and from a bed to a chair (including a wheelchair)?: A Little Help needed standing up from a chair using your arms (e.g., wheelchair or bedside chair)?: A Little Help needed to walk in hospital room?: A Little Help needed climbing 3-5 steps with a railing? : A Little 6 Click Score: 20    End of Session Equipment Utilized During Treatment: Oxygen Activity Tolerance: Patient tolerated treatment well Patient left: in chair;with call bell/phone within reach   PT Visit Diagnosis: Unsteadiness on feet (R26.81);Muscle weakness (generalized) (M62.81)     Time: 1694-5038 PT Time Calculation (min) (ACUTE ONLY): 19 min  Charges:  $Gait Training: 8-22 mins                     [place] Pager [phone] Office [place] 03/03/2019, 2:13 PM

## 2019-03-04 LAB — GLUCOSE, CAPILLARY
Glucose-Capillary: 65 mg/dL — ABNORMAL LOW (ref 70–99)
Glucose-Capillary: 130 mg/dL — ABNORMAL HIGH (ref 70–99)

## 2019-03-04 MED ORDER — BENZONATATE 200 MG PO CAPS: 200.0000 mg | ORAL_CAPSULE | Freq: Three times a day (TID) | ORAL | 0 refills | Status: DC | PRN

## 2019-03-04 MED ORDER — ACETAMINOPHEN 325 MG PO TABS: 650.0000 mg | ORAL_TABLET | Freq: Four times a day (QID) | ORAL | Status: DC | PRN

## 2019-03-04 MED ORDER — ALBUTEROL SULFATE HFA 108 (90 BASE) MCG/ACT IN AERS: 1.0000 | INHALATION_SPRAY | RESPIRATORY_TRACT | 0 refills | Status: DC | PRN

## 2019-03-04 MED ORDER — HYDROCOD POLST-CPM POLST ER 10-8 MG/5ML PO SUER: 5.0000 mL | Freq: Two times a day (BID) | ORAL | 0 refills | Status: DC | PRN

## 2019-03-04 MED ORDER — PNEUMOCOCCAL VAC POLYVALENT 25 MCG/0.5ML IJ INJ
0.5000 mL | INJECTION | INTRAMUSCULAR | Status: AC | PRN
  Administered 2019-03-04: 0.5 mL via INTRAMUSCULAR
  Filled 2019-03-04: qty 0.5

## 2019-03-04 NOTE — Discharge Instructions (Signed)
COVID-19 Frequently Asked Questions COVID-19 (coronavirus disease) is an infection that is caused by a large family of viruses. Some viruses cause illness in people and others cause illness in animals like camels, cats, and bats. In some cases, the viruses that cause illness in animals can spread to humans. Where did the coronavirus come from? In December 2019, [place] told the [facility]) of several cases of lung disease (human respiratory illness). These cases were linked to an open seafood and livestock market in the city of [place]. The link to the seafood and livestock market suggests that the virus may have spread from animals to humans. However, since that first outbreak in December, the virus has also been shown to spread from person to person. What is the name of the disease and the virus? Disease name Early on, this disease was called novel coronavirus. This is because scientists determined that the disease was caused by a new (novel) respiratory virus. The World Health Organization [facility]) has now named the disease COVID-19, or coronavirus disease. Virus name The virus that causes the disease is called severe acute respiratory syndrome coronavirus 2 (SARS-CoV-2). More information on disease and virus naming World Health Organization [facility]): www.who.int/emergencies/diseases/novel-coronavirus-2019/technical-guidance/naming-the-coronavirus-disease-(covid-2019)-and-the-virus-that-causes-it Who is at risk for complications from coronavirus disease? Some people may be at higher risk for complications from coronavirus disease. This includes older adults and people who have chronic diseases, such as heart disease, diabetes, and lung disease. If you are at higher risk for complications, take these extra precautions:  Avoid close contact with people who are sick or have a fever or cough. Stay at least 3-6 ft (1-2 m) away from them, if possible.  Wash your hands often with soap and  water for at least 20 seconds.  Avoid touching your face, mouth, nose, or eyes.  Keep supplies on hand at home, such as food, medicine, and cleaning supplies.  Stay home as much as possible.  Avoid social gatherings and travel. How does coronavirus disease spread? The virus that causes coronavirus disease spreads easily from person to person (is contagious). There are also cases of community-spread disease. This means the disease has spread to:  People who have no known contact with other infected people.  People who have not traveled to areas where there are known cases. It appears to spread from one person to another through droplets from coughing or sneezing. Can I get the virus from touching surfaces or objects? There is still a lot that we do not know about the virus that causes coronavirus disease. Scientists are basing a lot of information on what they know about similar viruses, such as:  Viruses cannot generally survive on surfaces for long. They need a human body (host) to survive.  It is more likely that the virus is spread by close contact with people who are sick (direct contact), such as through: ? Shaking hands or hugging. ? Breathing in respiratory droplets that travel through the air. This can happen when an infected person coughs or sneezes on or near other people.  It is less likely that the virus is spread when a person touches a surface or object that has the virus on it (indirect contact). The virus may be able to enter the body if the person touches a surface or object and then touches his or her face, eyes, nose, or mouth. Can a person spread the virus without having symptoms of the disease? It may be possible for the virus to spread before a  person has symptoms of the disease, but this is most likely not the main way the virus is spreading. It is more likely for the virus to spread by being in close contact with people who are sick and breathing in the respiratory  droplets of a sick person's cough or sneeze. What are the symptoms of coronavirus disease? Symptoms vary from person to person and can range from mild to severe. Symptoms may include:  Fever.  Cough.  Tiredness, weakness, or fatigue.  Fast breathing or feeling short of breath. These symptoms can appear anywhere from 2 to 14 days after you have been exposed to the virus. If you develop symptoms, call your health care provider. People with severe symptoms may need hospital care. If I am exposed to the virus, how long does it take before symptoms start? Symptoms of coronavirus disease may appear anywhere from 2 to 14 days after a person has been exposed to the virus. If you develop symptoms, call your health care provider. Should I be tested for this virus? Your health care provider will decide whether to test you based on your symptoms, history of exposure, and your risk factors. How does a health care provider test for this virus? Health care providers will collect samples to send for testing. Samples may include:  Taking a swab of fluid from the nose.  Taking fluid from the lungs by having you cough up mucus (sputum) into a sterile cup.  Taking a blood sample.  Taking a stool or urine sample. Is there a treatment or vaccine for this virus? Currently, there is no vaccine to prevent coronavirus disease. Also, there are no medicines like antibiotics or antivirals to treat the virus. A person who becomes sick is given supportive care, which means rest and fluids. A person may also relieve his or her symptoms by using over-the-counter medicines that treat sneezing, coughing, and runny nose. These are the same medicines that a person takes for the common cold. If you develop symptoms, call your health care provider. People with severe symptoms may need hospital care. What can I do to protect myself and my family from this virus?     You can protect yourself and your family by taking the  same actions that you would take to prevent the spread of other viruses. Take the following actions:  Wash your hands often with soap and water for at least 20 seconds. If soap and water are not available, use alcohol-based hand sanitizer.  Avoid touching your face, mouth, nose, or eyes.  Cough or sneeze into a tissue, sleeve, or elbow. Do not cough or sneeze into your hand or the air. ? If you cough or sneeze into a tissue, throw it away immediately and wash your hands.  Disinfect objects and surfaces that you frequently touch every day.  Avoid close contact with people who are sick or have a fever or cough. Stay at least 3-6 ft (1-2 m) away from them, if possible.  Stay home if you are sick, except to get medical care. Call your health care provider before you get medical care.  Make sure your vaccines are up to date. Ask your health care provider what vaccines you need. What should I do if I need to travel? Follow travel recommendations from your local health authority, the CDC, and WHO. Travel information and advice  Centers for Disease Control and Prevention (CDC): BodyEditor.hu  World Health Organization [facility]): ThirdIncome.ca Know the risks and take action to protect your  health  You are at higher risk of getting coronavirus disease if you are traveling to areas with an outbreak or if you are exposed to travelers from areas with an outbreak.  Wash your hands often and practice good hygiene to lower the risk of catching or spreading the virus. What should I do if I am sick? General instructions to stop the spread of infection  Wash your hands often with soap and water for at least 20 seconds. If soap and water are not available, use alcohol-based hand sanitizer.  Cough or sneeze into a tissue, sleeve, or elbow. Do not cough or sneeze into your hand or the air.  If you cough or  sneeze into a tissue, throw it away immediately and wash your hands.  Stay home unless you must get medical care. Call your health care provider or local health authority before you get medical care.  Avoid public areas. Do not take public transportation, if possible.  If you can, wear a mask if you must go out of the house or if you are in close contact with someone who is not sick. Keep your home clean  Disinfect objects and surfaces that are frequently touched every day. This may include: ? Counters and tables. ? Doorknobs and light switches. ? Sinks and faucets. ? Electronics such as phones, remote controls, keyboards, computers, and tablets.  Wash dishes in hot, soapy water or use a dishwasher. Air-dry your dishes.  Wash laundry in hot water. Prevent infecting other household members  Let healthy household members care for children and pets, if possible. If you have to care for children or pets, wash your hands often and wear a mask.  Sleep in a different bedroom or bed, if possible.  Do not share personal items, such as razors, toothbrushes, deodorant, combs, brushes, towels, and washcloths. Where to find more information Centers for Disease Control and Prevention (CDC)  Information and news updates: https://www.butler-gonzalez.com/ World Health Organization [facility])  Information and news updates: MissExecutive.com.ee  Coronavirus health topic: https://www.castaneda.info/  Questions and answers on COVID-19: OpportunityDebt.at  Global tracker: who.sprinklr.com American Academy of Pediatrics (AAP)  Information for families: www.healthychildren.org/English/health-issues/conditions/chest-lungs/Pages/2019-Novel-Coronavirus.aspx The coronavirus situation is changing rapidly. Check your local health authority website or the CDC and [facility] websites for updates and news. When should I contact a health care  provider?  Contact your health care provider if you have symptoms of an infection, such as fever or cough, and you: ? Have been near anyone who is known to have coronavirus disease. ? Have come into contact with a person who is suspected to have coronavirus disease. ? Have traveled outside of the country. When should I get emergency medical care?  Get help right away by calling your local emergency services (911 in the [place] if you have: ? Trouble breathing. ? Pain or pressure in your chest. ? Confusion. ? Blue-tinged lips and fingernails. ? Difficulty waking from sleep. ? Symptoms that get worse. Let the emergency medical personnel know if you think you have coronavirus disease. Summary  A new respiratory virus is spreading from person to person and causing COVID-19 (coronavirus disease).  The virus that causes COVID-19 appears to spread easily. It spreads from one person to another through droplets from coughing or sneezing.  Older adults and those with chronic diseases are at higher risk of disease. If you are at higher risk for complications, take extra precautions.  There is currently no vaccine to prevent coronavirus disease. There are no medicines, such as  antibiotics or antivirals, to treat the virus.  You can protect yourself and your family by washing your hands often, avoiding touching your face, and covering your coughs and sneezes. This information is not intended to replace advice given to you by your health care provider. Make sure you discuss any questions you have with your health care provider. Document Released: 10/31/2018 Document Revised: 10/31/2018 Document Reviewed: 10/31/2018 Elsevier Patient Education  2020 [place].  COVID-19: How to Protect Yourself and Others Know how it spreads  There is currently no vaccine to prevent coronavirus disease 2019 (COVID-19).  The best way to prevent illness is to avoid being exposed to this virus.  The virus is  thought to spread mainly from person-to-person. ? Between people who are in close contact with one another (within about 6 feet). ? Through respiratory droplets produced when an infected person coughs, sneezes or talks. ? These droplets can land in the mouths or noses of people who are nearby or possibly be inhaled into the lungs. ? Some recent studies have suggested that COVID-19 may be spread by people who are not showing symptoms. Everyone should Clean your hands often  Wash your hands often with soap and water for at least 20 seconds especially after you have been in a public place, or after blowing your nose, coughing, or sneezing.  If soap and water are not readily available, use a hand sanitizer that contains at least 60% alcohol. Cover all surfaces of your hands and rub them together until they feel dry.  Avoid touching your eyes, nose, and mouth with unwashed hands. Avoid close contact  Stay home if you are sick.  Avoid close contact with people who are sick.  Put distance between yourself and other people. ? Remember that some people without symptoms may be able to spread virus. ? This is especially important for people who are at higher risk of getting very GainPain.com.cy Cover your mouth and nose with a cloth face cover when around others  You could spread COVID-19 to others even if you do not feel sick.  Everyone should wear a cloth face cover when they have to go out in public, for example to the grocery store or to pick up other necessities. ? Cloth face coverings should not be placed on young children under age 32, anyone who has trouble breathing, or is unconscious, incapacitated or otherwise unable to remove the mask without assistance.  The cloth face cover is meant to protect other people in case you are infected.  Do NOT use a facemask meant for a Dietitian.  Continue to keep about 6  feet between yourself and others. The cloth face cover is not a substitute for social distancing. Cover coughs and sneezes  If you are in a private setting and do not have on your cloth face covering, remember to always cover your mouth and nose with a tissue when you cough or sneeze or use the inside of your elbow.  Throw used tissues in the trash.  Immediately wash your hands with soap and water for at least 20 seconds. If soap and water are not readily available, clean your hands with a hand sanitizer that contains at least 60% alcohol. Clean and disinfect  Clean AND disinfect frequently touched surfaces daily. This includes tables, doorknobs, light switches, countertops, handles, desks, phones, keyboards, toilets, faucets, and sinks. RackRewards.fr  If surfaces are dirty, clean them: Use detergent or soap and water prior to disinfection.  Then, use a  household disinfectant. You can see a list of EPA-registered household disinfectants here. michellinders.com 11/21/2018 This information is not intended to replace advice given to you by your health care provider. Make sure you discuss any questions you have with your health care provider. Document Released: 10/31/2018 Document Revised: 11/29/2018 Document Reviewed: 10/31/2018 Elsevier Patient Education  2020 [employer]. COVID-19 COVID-19 is a respiratory infection that is caused by a virus called severe acute respiratory syndrome coronavirus 2 (SARS-CoV-2). The disease is also known as coronavirus disease or novel coronavirus. In some people, the virus may not cause any symptoms. In others, it may cause a serious infection. The infection can get worse quickly and can lead to complications, such as:  Pneumonia, or infection of the lungs.  Acute respiratory distress syndrome or ARDS. This is fluid build-up in the lungs.  Acute respiratory failure. This is a condition  in which there is not enough oxygen passing from the lungs to the body.  Sepsis or septic shock. This is a serious bodily reaction to an infection.  Blood clotting problems.  Secondary infections due to bacteria or fungus. The virus that causes COVID-19 is contagious. This means that it can spread from person to person through droplets from coughs and sneezes (respiratory secretions). What are the causes? This illness is caused by a virus. You may catch the virus by:  Breathing in droplets from an infected person's cough or sneeze.  Touching something, like a table or a doorknob, that was exposed to the virus (contaminated) and then touching your mouth, nose, or eyes. What increases the risk? Risk for infection You are more likely to be infected with this virus if you:  Live in or travel to an area with a COVID-19 outbreak.  Come in contact with a sick person who recently traveled to an area with a COVID-19 outbreak.  Provide care for or live with a person who is infected with COVID-19. Risk for serious illness You are more likely to become seriously ill from the virus if you:  Are 9 years of age or older.  Have a long-term disease that lowers your body's ability to fight infection (immunocompromised).  Live in a nursing home or long-term care facility.  Have a long-term (chronic) disease such as: ? Chronic lung disease, including chronic obstructive pulmonary disease or asthma ? Heart disease. ? Diabetes. ? Chronic kidney disease. ? Liver disease.  Are obese. What are the signs or symptoms? Symptoms of this condition can range from mild to severe. Symptoms may appear any time from 2 to 14 days after being exposed to the virus. They include:  A fever.  A cough.  Difficulty breathing.  Chills.  Muscle pains.  A sore throat.  Loss of taste or smell. Some people may also have stomach problems, such as nausea, vomiting, or diarrhea. Other people may not have any  symptoms of COVID-19. How is this diagnosed? This condition may be diagnosed based on:  Your signs and symptoms, especially if: ? You live in an area with a COVID-19 outbreak. ? You recently traveled to or from an area where the virus is common. ? You provide care for or live with a person who was diagnosed with COVID-19.  A physical exam.  Lab tests, which may include: ? A nasal swab to take a sample of fluid from your nose. ? A throat swab to take a sample of fluid from your throat. ? A sample of mucus from your lungs (sputum). ? Blood tests.  Imaging tests, which may include, X-rays, CT scan, or ultrasound. How is this treated? At present, there is no medicine to treat COVID-19. Medicines that treat other diseases are being used on a trial basis to see if they are effective against COVID-19. Your health care provider will talk with you about ways to treat your symptoms. For most people, the infection is mild and can be managed at home with rest, fluids, and over-the-counter medicines. Treatment for a serious infection usually takes places in a hospital intensive care unit (ICU). It may include one or more of the following treatments. These treatments are given until your symptoms improve.  Receiving fluids and medicines through an IV.  Supplemental oxygen. Extra oxygen is given through a tube in the nose, a face mask, or a hood.  Positioning you to lie on your stomach (prone position). This makes it easier for oxygen to get into the lungs.  Continuous positive airway pressure (CPAP) or bi-level positive airway pressure (BPAP) machine. This treatment uses mild air pressure to keep the airways open. A tube that is connected to a motor delivers oxygen to the body.  Ventilator. This treatment moves air into and out of the lungs by using a tube that is placed in your windpipe.  Tracheostomy. This is a procedure to create a hole in the neck so that a breathing tube can be  inserted.  Extracorporeal membrane oxygenation (ECMO). This procedure gives the lungs a chance to recover by taking over the functions of the heart and lungs. It supplies oxygen to the body and removes carbon dioxide. Follow these instructions at home: Lifestyle  If you are sick, stay home except to get medical care. Your health care provider will tell you how long to stay home. Call your health care provider before you go for medical care.  Rest at home as told by your health care provider.  Do not use any products that contain nicotine or tobacco, such as cigarettes, e-cigarettes, and chewing tobacco. If you need help quitting, ask your health care provider.  Return to your normal activities as told by your health care provider. Ask your health care provider what activities are safe for you. General instructions  Take over-the-counter and prescription medicines only as told by your health care provider.  Drink enough fluid to keep your urine pale yellow.  Keep all follow-up visits as told by your health care provider. This is important. How is this prevented?  There is no vaccine to help prevent COVID-19 infection. However, there are steps you can take to protect yourself and others from this virus. To protect yourself:   Do not travel to areas where COVID-19 is a risk. The areas where COVID-19 is reported change often. To identify high-risk areas and travel restrictions, check the CDC travel website: FatFares.com.br  If you live in, or must travel to, an area where COVID-19 is a risk, take precautions to avoid infection. ? Stay away from people who are sick. ? Wash your hands often with soap and water for 20 seconds. If soap and water are not available, use an alcohol-based hand sanitizer. ? Avoid touching your mouth, face, eyes, or nose. ? Avoid going out in public, follow guidance from your state and local health authorities. ? If you must go out in public, wear a  cloth face covering or face mask. ? Disinfect objects and surfaces that are frequently touched every day. This may include:  Counters and tables.  Doorknobs and light switches.  Sinks and faucets.  Electronics, such as phones, remote controls, keyboards, computers, and tablets. To protect others: If you have symptoms of COVID-19, take steps to prevent the virus from spreading to others.  If you think you have a COVID-19 infection, contact your health care provider right away. Tell your health care team that you think you may have a COVID-19 infection.  Stay home. Leave your house only to seek medical care. Do not use public transport.  Do not travel while you are sick.  Wash your hands often with soap and water for 20 seconds. If soap and water are not available, use alcohol-based hand sanitizer.  Stay away from other members of your household. Let healthy household members care for children and pets, if possible. If you have to care for children or pets, wash your hands often and wear a mask. If possible, stay in your own room, separate from others. Use a different bathroom.  Make sure that all people in your household wash their hands well and often.  Cough or sneeze into a tissue or your sleeve or elbow. Do not cough or sneeze into your hand or into the air.  Wear a cloth face covering or face mask. Where to find more information  Centers for Disease Control and Prevention: PurpleGadgets.be  World Health Organization: https://www.castaneda.info/ Contact a health care provider if:  You live in or have traveled to an area where COVID-19 is a risk and you have symptoms of the infection.  You have had contact with someone who has COVID-19 and you have symptoms of the infection. Get help right away if:  You have trouble breathing.  You have pain or pressure in your chest.  You have confusion.  You have bluish lips and  fingernails.  You have difficulty waking from sleep.  You have symptoms that get worse. These symptoms may represent a serious problem that is an emergency. Do not wait to see if the symptoms will go away. Get medical help right away. Call your local emergency services (911 in the [place] Do not drive yourself to the hospital. Let the emergency medical personnel know if you think you have COVID-19. Summary  COVID-19 is a respiratory infection that is caused by a virus. It is also known as coronavirus disease or novel coronavirus. It can cause serious infections, such as pneumonia, acute respiratory distress syndrome, acute respiratory failure, or sepsis.  The virus that causes COVID-19 is contagious. This means that it can spread from person to person through droplets from coughs and sneezes.  You are more likely to develop a serious illness if you are 32 years of age or older, have a weak immunity, live in a nursing home, or have chronic disease.  There is no medicine to treat COVID-19. Your health care provider will talk with you about ways to treat your symptoms.  Take steps to protect yourself and others from infection. Wash your hands often and disinfect objects and surfaces that are frequently touched every day. Stay away from people who are sick and wear a mask if you are sick. This information is not intended to replace advice given to you by your health care provider. Make sure you discuss any questions you have with your health care provider. Document Released: 08/10/2018 Document Revised: 11/30/2018 Document Reviewed: 08/10/2018 Elsevier Patient Education  2020 [employer].    COVID-19 Frequently Asked Questions COVID-19 (coronavirus disease) is an infection that is caused by a large family of viruses. Some viruses cause  illness in people and others cause illness in animals like camels, cats, and bats. In some cases, the viruses that cause illness in animals can spread to  humans. Where did the coronavirus come from? In December 2019, [place] told the [facility]) of several cases of lung disease (human respiratory illness). These cases were linked to an open seafood and livestock market in the city of [place]. The link to the seafood and livestock market suggests that the virus may have spread from animals to humans. However, since that first outbreak in December, the virus has also been shown to spread from person to person. What is the name of the disease and the virus? Disease name Early on, this disease was called novel coronavirus. This is because scientists determined that the disease was caused by a new (novel) respiratory virus. The World Health Organization [facility]) has now named the disease COVID-19, or coronavirus disease. Virus name The virus that causes the disease is called severe acute respiratory syndrome coronavirus 2 (SARS-CoV-2). More information on disease and virus naming World Health Organization [facility]): www.who.int/emergencies/diseases/novel-coronavirus-2019/technical-guidance/naming-the-coronavirus-disease-(covid-2019)-and-the-virus-that-causes-it Who is at risk for complications from coronavirus disease? Some people may be at higher risk for complications from coronavirus disease. This includes older adults and people who have chronic diseases, such as heart disease, diabetes, and lung disease. If you are at higher risk for complications, take these extra precautions:  Avoid close contact with people who are sick or have a fever or cough. Stay at least 3-6 ft (1-2 m) away from them, if possible.  Wash your hands often with soap and water for at least 20 seconds.  Avoid touching your face, mouth, nose, or eyes.  Keep supplies on hand at home, such as food, medicine, and cleaning supplies.  Stay home as much as possible.  Avoid social gatherings and travel. How does coronavirus disease spread? The virus that causes  coronavirus disease spreads easily from person to person (is contagious). There are also cases of community-spread disease. This means the disease has spread to:  People who have no known contact with other infected people.  People who have not traveled to areas where there are known cases. It appears to spread from one person to another through droplets from coughing or sneezing. Can I get the virus from touching surfaces or objects? There is still a lot that we do not know about the virus that causes coronavirus disease. Scientists are basing a lot of information on what they know about similar viruses, such as:  Viruses cannot generally survive on surfaces for long. They need a human body (host) to survive.  It is more likely that the virus is spread by close contact with people who are sick (direct contact), such as through: ? Shaking hands or hugging. ? Breathing in respiratory droplets that travel through the air. This can happen when an infected person coughs or sneezes on or near other people.  It is less likely that the virus is spread when a person touches a surface or object that has the virus on it (indirect contact). The virus may be able to enter the body if the person touches a surface or object and then touches his or her face, eyes, nose, or mouth. Can a person spread the virus without having symptoms of the disease? It may be possible for the virus to spread before a person has symptoms of the disease, but this is most likely not the main way the virus is spreading. It is more  likely for the virus to spread by being in close contact with people who are sick and breathing in the respiratory droplets of a sick person's cough or sneeze. What are the symptoms of coronavirus disease? Symptoms vary from person to person and can range from mild to severe. Symptoms may include:  Fever.  Cough.  Tiredness, weakness, or fatigue.  Fast breathing or feeling short of breath. These  symptoms can appear anywhere from 2 to 14 days after you have been exposed to the virus. If you develop symptoms, call your health care provider. People with severe symptoms may need hospital care. If I am exposed to the virus, how long does it take before symptoms start? Symptoms of coronavirus disease may appear anywhere from 2 to 14 days after a person has been exposed to the virus. If you develop symptoms, call your health care provider. Should I be tested for this virus? Your health care provider will decide whether to test you based on your symptoms, history of exposure, and your risk factors. How does a health care provider test for this virus? Health care providers will collect samples to send for testing. Samples may include:  Taking a swab of fluid from the nose.  Taking fluid from the lungs by having you cough up mucus (sputum) into a sterile cup.  Taking a blood sample.  Taking a stool or urine sample. Is there a treatment or vaccine for this virus? Currently, there is no vaccine to prevent coronavirus disease. Also, there are no medicines like antibiotics or antivirals to treat the virus. A person who becomes sick is given supportive care, which means rest and fluids. A person may also relieve his or her symptoms by using over-the-counter medicines that treat sneezing, coughing, and runny nose. These are the same medicines that a person takes for the common cold. If you develop symptoms, call your health care provider. People with severe symptoms may need hospital care. What can I do to protect myself and my family from this virus?     You can protect yourself and your family by taking the same actions that you would take to prevent the spread of other viruses. Take the following actions:  Wash your hands often with soap and water for at least 20 seconds. If soap and water are not available, use alcohol-based hand sanitizer.  Avoid touching your face, mouth, nose, or  eyes.  Cough or sneeze into a tissue, sleeve, or elbow. Do not cough or sneeze into your hand or the air. ? If you cough or sneeze into a tissue, throw it away immediately and wash your hands.  Disinfect objects and surfaces that you frequently touch every day.  Avoid close contact with people who are sick or have a fever or cough. Stay at least 3-6 ft (1-2 m) away from them, if possible.  Stay home if you are sick, except to get medical care. Call your health care provider before you get medical care.  Make sure your vaccines are up to date. Ask your health care provider what vaccines you need. What should I do if I need to travel? Follow travel recommendations from your local health authority, the CDC, and WHO. Travel information and advice  Centers for Disease Control and Prevention (CDC): BodyEditor.hu  World Health Organization [facility]): ThirdIncome.ca Know the risks and take action to protect your health  You are at higher risk of getting coronavirus disease if you are traveling to areas with an outbreak or if  you are exposed to travelers from areas with an outbreak.  Wash your hands often and practice good hygiene to lower the risk of catching or spreading the virus. What should I do if I am sick? General instructions to stop the spread of infection  Wash your hands often with soap and water for at least 20 seconds. If soap and water are not available, use alcohol-based hand sanitizer.  Cough or sneeze into a tissue, sleeve, or elbow. Do not cough or sneeze into your hand or the air.  If you cough or sneeze into a tissue, throw it away immediately and wash your hands.  Stay home unless you must get medical care. Call your health care provider or local health authority before you get medical care.  Avoid public areas. Do not take public transportation, if possible.  If you can, wear  a mask if you must go out of the house or if you are in close contact with someone who is not sick. Keep your home clean  Disinfect objects and surfaces that are frequently touched every day. This may include: ? Counters and tables. ? Doorknobs and light switches. ? Sinks and faucets. ? Electronics such as phones, remote controls, keyboards, computers, and tablets.  Wash dishes in hot, soapy water or use a dishwasher. Air-dry your dishes.  Wash laundry in hot water. Prevent infecting other household members  Let healthy household members care for children and pets, if possible. If you have to care for children or pets, wash your hands often and wear a mask.  Sleep in a different bedroom or bed, if possible.  Do not share personal items, such as razors, toothbrushes, deodorant, combs, brushes, towels, and washcloths. Where to find more information Centers for Disease Control and Prevention (CDC)  Information and news updates: https://www.butler-gonzalez.com/ World Health Organization [facility])  Information and news updates: MissExecutive.com.ee  Coronavirus health topic: https://www.castaneda.info/  Questions and answers on COVID-19: OpportunityDebt.at  Global tracker: who.sprinklr.com American Academy of Pediatrics (AAP)  Information for families: www.healthychildren.org/English/health-issues/conditions/chest-lungs/Pages/2019-Novel-Coronavirus.aspx The coronavirus situation is changing rapidly. Check your local health authority website or the CDC and [facility] websites for updates and news. When should I contact a health care provider?  Contact your health care provider if you have symptoms of an infection, such as fever or cough, and you: ? Have been near anyone who is known to have coronavirus disease. ? Have come into contact with a person who is suspected to have coronavirus disease. ? Have traveled  outside of the country. When should I get emergency medical care?  Get help right away by calling your local emergency services (911 in the [place] if you have: ? Trouble breathing. ? Pain or pressure in your chest. ? Confusion. ? Blue-tinged lips and fingernails. ? Difficulty waking from sleep. ? Symptoms that get worse. Let the emergency medical personnel know if you think you have coronavirus disease. Summary  A new respiratory virus is spreading from person to person and causing COVID-19 (coronavirus disease).  The virus that causes COVID-19 appears to spread easily. It spreads from one person to another through droplets from coughing or sneezing.  Older adults and those with chronic diseases are at higher risk of disease. If you are at higher risk for complications, take extra precautions.  There is currently no vaccine to prevent coronavirus disease. There are no medicines, such as antibiotics or antivirals, to treat the virus.  You can protect yourself and your family by washing your hands often, avoiding touching  your face, and covering your coughs and sneezes. This information is not intended to replace advice given to you by your health care provider. Make sure you discuss any questions you have with your health care provider. Document Released: 10/31/2018 Document Revised: 10/31/2018 Document Reviewed: 10/31/2018 Elsevier Patient Education  2020 [employer].   COVID-19: How to Protect Yourself and Others Know how it spreads  There is currently no vaccine to prevent coronavirus disease 2019 (COVID-19).  The best way to prevent illness is to avoid being exposed to this virus.  The virus is thought to spread mainly from person-to-person. ? Between people who are in close contact with one another (within about 6 feet). ? Through respiratory droplets produced when an infected person coughs, sneezes or talks. ? These droplets can land in the mouths or noses of people who are  nearby or possibly be inhaled into the lungs. ? Some recent studies have suggested that COVID-19 may be spread by people who are not showing symptoms. Everyone should Clean your hands often  Wash your hands often with soap and water for at least 20 seconds especially after you have been in a public place, or after blowing your nose, coughing, or sneezing.  If soap and water are not readily available, use a hand sanitizer that contains at least 60% alcohol. Cover all surfaces of your hands and rub them together until they feel dry.  Avoid touching your eyes, nose, and mouth with unwashed hands. Avoid close contact  Stay home if you are sick.  Avoid close contact with people who are sick.  Put distance between yourself and other people. ? Remember that some people without symptoms may be able to spread virus. ? This is especially important for people who are at higher risk of getting very GainPain.com.cy Cover your mouth and nose with a cloth face cover when around others  You could spread COVID-19 to others even if you do not feel sick.  Everyone should wear a cloth face cover when they have to go out in public, for example to the grocery store or to pick up other necessities. ? Cloth face coverings should not be placed on young children under age 23, anyone who has trouble breathing, or is unconscious, incapacitated or otherwise unable to remove the mask without assistance.  The cloth face cover is meant to protect other people in case you are infected.  Do NOT use a facemask meant for a Dietitian.  Continue to keep about 6 feet between yourself and others. The cloth face cover is not a substitute for social distancing. Cover coughs and sneezes  If you are in a private setting and do not have on your cloth face covering, remember to always cover your mouth and nose with a tissue when you cough or sneeze  or use the inside of your elbow.  Throw used tissues in the trash.  Immediately wash your hands with soap and water for at least 20 seconds. If soap and water are not readily available, clean your hands with a hand sanitizer that contains at least 60% alcohol. Clean and disinfect  Clean AND disinfect frequently touched surfaces daily. This includes tables, doorknobs, light switches, countertops, handles, desks, phones, keyboards, toilets, faucets, and sinks. RackRewards.fr  If surfaces are dirty, clean them: Use detergent or soap and water prior to disinfection.  Then, use a household disinfectant. You can see a list of EPA-registered household disinfectants here. michellinders.com 11/21/2018 This information is not intended to replace  advice given to you by your health care provider. Make sure you discuss any questions you have with your health care provider. Document Released: 10/31/2018 Document Revised: 11/29/2018 Document Reviewed: 10/31/2018 Elsevier Patient Education  [place].

## 2019-03-04 NOTE — Progress Notes (Signed)
2840 CBG 65 provided 4oz OJ rechecked CBG per protocol CBG 92

## 2019-03-04 NOTE — Progress Notes (Signed)
Review AVS with pt. Pt verbalized understanding.  Thermometer provided. Pt does not need pulse ox has one at home. 3 printed RX to pt with AVS. MD signed tussionex

## 2019-03-04 NOTE — TOC Initial Note (Signed)
Transition of Care [facility]) - Initial/Assessment Note    Patient Details  Name: Ronald Arnold MRN: [id number] Date of Birth: [birth year]  [facility]) CM/SW Contact:    Ronald Rasher, RN Phone Number: [phone] 03/04/2019, 11:44 AM  Clinical Narrative:                 Spoke to patient and gave permission to speak to dtr, Ronald Arnold. Contacted dtr and pt has portable to dc home. Oxygen arranged with [facility]. Will notify Unit RN.   Expected Discharge Plan: Home/Self Care Barriers to Discharge: No Barriers Identified   Patient Goals and CMS Choice        Expected Discharge Plan and Services Expected Discharge Plan: Home/Self Care       Living arrangements for the past 2 months: Single Family Home Expected Discharge Date: 03/04/19                                    Prior Living Arrangements/Services Living arrangements for the past 2 months: Single Family Home Lives with:: Spouse Patient language and need for interpreter reviewed:: Yes Do you feel safe going back to the place where you live?: Yes      Need for Family Participation in Patient Care: Yes (Comment) Care giver support system in place?: Yes (comment)   Criminal Activity/Legal Involvement Pertinent to Current Situation/Hospitalization: No - Comment as needed  Activities of Daily Living Home Assistive Devices/Equipment: Eyeglasses ADL Screening (condition at time of admission) Patient's cognitive ability adequate to safely complete daily activities?: Yes Is the patient deaf or have difficulty hearing?: No Does the patient have difficulty seeing, even when wearing glasses/contacts?: No Does the patient have difficulty concentrating, remembering, or making decisions?: No Patient able to express need for assistance with ADLs?: Yes Does the patient have difficulty dressing or bathing?: No Independently performs ADLs?: Yes (appropriate for developmental age) Does the patient have  difficulty walking or climbing stairs?: No Weakness of Legs: None Weakness of Arms/Hands: None  Permission Sought/Granted Permission sought to share information with : Case Manager, Family Supports Permission granted to share information with : Yes, Verbal Permission Granted  Share Information with NAME: Ronald Arnold  Permission granted to share info w AGENCY: [place] granted to share info w Relationship: daughter  Permission granted to share info w Contact Information: [phone]  Emotional Assessment       Orientation: : Oriented to Self, Oriented to Place, Oriented to  Time, Oriented to Situation   Psych Involvement: No (comment)  Admission diagnosis:  Lower respiratory tract infection due to COVID-19 virus [U07.1, J22] Patient Active Problem List   Diagnosis Date Noted  . Pneumonia due to COVID-19 virus 02/21/2019  . HTN (hypertension) 02/21/2019  . HLD (hyperlipidemia) 02/21/2019  . Acute respiratory failure with hypoxia ([place]) 02/21/2019  . Prediabetes 02/21/2019  . Hypoxia   . Lower respiratory tract infection due to COVID-19 virus 02/17/2019  . Prostate cancer ([place]) 06/20/2014   PCP:  Ronald Cruel, MD Pharmacy:   [id number] [address], [place] [address] Phone: [phone] Fax: [phone]     Social Determinants of Health (SDOH) Interventions    Readmission Risk Interventions No flowsheet data found.

## 2019-03-04 NOTE — Progress Notes (Signed)
SATURATION QUALIFICATIONS: (This note is used to comply with regulatory documentation for home oxygen)  Patient Saturations on Room Air at Rest = 94%  Patient Saturations on Room Air while Ambulating = 83%  Patient Saturations on 2Liters of oxygen while Ambulating = 91%  Please briefly explain why patient needs home oxygen:

## 2019-03-04 NOTE — Discharge Summary (Signed)
DISCHARGE SUMMARY  Ronald Arnold  MR#: [id number]  DOB:09/12/1955  Date of Admission: 02/17/2019 Date of Discharge: 03/04/2019  Attending Physician:Bridget [place] Hennie Duos, MD  Patient's MPN:Ronald Arnold, [name], MD  Consults: PCCM  Disposition: D/C home   Follow-up Appts: Follow-up Information    Lawerance Cruel, MD. Schedule an appointment as soon as possible for a visit in 5 day(s).   Specialty: Family Medicine Contact information: [address] [place] [address] [phone]           Tests Needing Follow-up: -assess ability to wean O2  -determine if pt has recovered to extent to allow return to work   COVID-19 specific Treatment: Decadron 8/1 > 8/14 Remdesivir 8/1 > 8/5 Actemra 8/1 and 8/4  Discharge Diagnoses: COVID pneumonia Acute hypoxic respiratory failure Community-acquired pneumonia HTN Prediabetes with hyperglycemia HLD History of prostate cancer  Initial presentation: 63 year old with a history of prostate cancer status post surgery 5 years ago, DVT in 2015 treated with Xarelto, HTN, and HLD who was admitted 8/1 with complaints of shortness of breath.  He was found to have pneumonia consistent with COVID-19.  Following his admission on 8/1 treatment was initiated with Decadron, Remdesivir, and Actemra. He required transfer to the ICU 8/2 to utilize heated high flow nasal cannula oxygen.  He did not required intubation as he did well on heated high flow.  Hospital Course: 8/1 admit to [facility] via [facility] 8/2 transfer to [facility] ICU - PCCM consult - heated high flow - prone positioning 8/5 transferred to [facility] ICU 8/12 transfer to PCU  COVID Pneumonia Has been treated with Actemra, Remdesivir, and steroids - continue to wean oxygen support as able upon returning home - requiring 3-4LPM w/ exertion only, and stable sats at >90% on RA when not ambulating - cont incentive spirometer   Community-acquired pneumonia Has completed a 5-day course of  Rocephin -no persisting signs of bacterial infection at time of discharge  HTN BP controlled at time of discharge  Prediabetes - hyperglycemia A1c 6.2 -patient experienced hyperglycemia while on steroids but this rapidly resolved after the patient completed his steroid course  HLD Continue home medical therapy  History of prostate cancer Asymptomatic at present  Allergies as of 03/04/2019      Reactions   Sulfa Antibiotics Other (See Comments)   As a child patient is unaware of reaction      Medication List    STOP taking these medications   predniSONE 10 MG tablet Commonly known as: DELTASONE     TAKE these medications   acetaminophen 325 MG tablet Commonly known as: TYLENOL Take 2 tablets (650 mg total) by mouth every 6 (six) hours as needed for mild pain (or Fever >/= 101).   albuterol 108 (90 Base) MCG/ACT inhaler Commonly known as: VENTOLIN HFA Inhale 1-2 puffs into the lungs every 4 (four) hours as needed for wheezing or shortness of breath.   aspirin EC 81 MG tablet Take 81 mg by mouth daily.   benzonatate 200 MG capsule Commonly known as: TESSALON Take 1 capsule (200 mg total) by mouth 3 (three) times daily as needed for cough.   chlorpheniramine-HYDROcodone 10-8 MG/5ML Suer Commonly known as: TUSSIONEX Take 5 mLs by mouth every 12 (twelve) hours as needed for cough.   fexofenadine 180 MG tablet Commonly known as: ALLEGRA Take 180 mg by mouth daily.   fluticasone 50 MCG/ACT nasal spray Commonly known as: FLONASE Place into both nostrils daily.   multivitamin with minerals  Tabs tablet Take 1 tablet by mouth daily.   rosuvastatin 5 MG tablet Commonly known as: CRESTOR Take 5 mg by mouth daily.   vitamin C 1000 MG tablet Take 1,000 mg by mouth daily.   Vitamin D-3 25 MCG (1000 UT) Caps Take 2,000 Units by mouth daily.            Durable Medical Equipment  (From admission, onward)         Start     Ordered   03/04/19 1047  For home  use only DME oxygen  Once    Question Answer Comment  Length of Need 6 Months   Mode or (Route) Nasal cannula   Liters per Minute 3   Frequency Continuous (stationary and portable oxygen unit needed)   Oxygen conserving device Yes   Oxygen delivery system Gas      03/04/19 1047          Day of Discharge BP 108/74 (BP Location: Left Arm)   Pulse 81   Temp 98.4 F (36.9 C) (Oral)   Resp 20   Ht 5\' 10"  (1.778 m)   Wt 84.7 kg   SpO2 96%   BMI 26.79 kg/m   Physical Exam: General: No acute respiratory distress Lungs: Faint basilar crackles with good air movement in other fields with no wheezing Cardiovascular: Regular rate and rhythm without murmur gallop or rub normal S1 and S2 Abdomen: Nontender, nondistended, overweight, soft, bowel sounds positive, no rebound, no ascites, no appreciable mass Extremities: No significant cyanosis, clubbing, or edema bilateral lower extremities  Basic Metabolic Panel: Recent Labs  Lab 02/26/19 0555 02/27/19 0930 02/28/19 0635 03/01/19 0115 03/02/19 0227 03/03/19 0050  NA 132* 135 136 136 136 135  K 4.0 4.0 4.1 3.7 4.5 4.5  CL 93* 97* 97* 97* 98 98  CO2 28 29 28 24 26 26   GLUCOSE 114* 82 103* 69* 103* 102*  BUN 34* 33* 35* 30* 30* 27*  CREATININE 0.93 0.78 0.75 0.78 0.79 0.84  CALCIUM 8.4* 8.1* 8.3* 8.6* 8.6* 8.8*  MG 2.1 2.3 2.2 2.0  --   --   PHOS 4.4 3.4 3.4 3.9  --   --     Liver Function Tests: Recent Labs  Lab 02/27/19 0930 02/28/19 0635 03/01/19 0115 03/02/19 0227 03/03/19 0050  AST 43* 36 35 33 29  ALT 72* 77* 80* 82* 85*  ALKPHOS 47 41 39 40 47  BILITOT 1.0 0.8 0.5 0.7 0.9  PROT 6.2* 5.9* 6.0* 5.8* 6.5  ALBUMIN 3.1* 2.9* 3.0* 3.1* 3.3*    CBC: Recent Labs  Lab 02/27/19 0930 02/28/19 0635 03/01/19 0115 03/02/19 0227 03/03/19 0050  WBC 18.7* 17.5* 15.8* 14.8* 17.3*  NEUTROABS 16.6* 15.3* 14.0* 12.8* 14.7*  HGB 16.4 15.7 15.4 14.8 15.3  HCT 47.2 45.9 45.1 44.5 45.0  MCV 90.8 91.6 92.6 93.3 92.4  PLT  345 302 309 266 285    CBG: Recent Labs  Lab 03/03/19 0754 03/03/19 1206 03/03/19 1623 03/03/19 2104 03/04/19 0808  GLUCAP 79 199* 109* 157* 65*    Time spent in discharge (includes decision making & examination of pt): 35 minutes  03/04/2019, 10:50 AM   Cherene Altes, MD [facility] Office  [phone]

## 2019-03-05 LAB — GLUCOSE, CAPILLARY: Glucose-Capillary: 122 mg/dL — ABNORMAL HIGH (ref 70–99)

## 2019-03-06 ENCOUNTER — Encounter (INDEPENDENT_AMBULATORY_CARE_PROVIDER_SITE_OTHER): Payer: Self-pay

## 2019-03-07 ENCOUNTER — Encounter (INDEPENDENT_AMBULATORY_CARE_PROVIDER_SITE_OTHER): Payer: Self-pay

## 2019-03-08 ENCOUNTER — Other Ambulatory Visit (HOSPITAL_COMMUNITY): Payer: Self-pay | Admitting: Family Medicine

## 2019-03-08 ENCOUNTER — Encounter (INDEPENDENT_AMBULATORY_CARE_PROVIDER_SITE_OTHER): Payer: Self-pay

## 2019-03-08 ENCOUNTER — Other Ambulatory Visit: Payer: Self-pay

## 2019-03-08 ENCOUNTER — Ambulatory Visit (HOSPITAL_COMMUNITY)
Admission: RE | Admit: 2019-03-08 | Discharge: 2019-03-08 | Disposition: A | Discharge status: Home / Self Care | Payer: BC Managed Care – PPO | Source: Ambulatory Visit | Attending: Family Medicine | Admitting: Family Medicine

## 2019-03-08 DIAGNOSIS — R52 Pain, unspecified: Secondary | ICD-10-CM

## 2019-03-08 NOTE — Progress Notes (Signed)
VASCULAR LAB PRELIMINARY  PRELIMINARY  PRELIMINARY  PRELIMINARY  Left lower extremity venous duplex completed.    Preliminary report:  See CV proc for preliminary results.  Called results to Dr. Marnee Spring, [facility], RVT 03/08/2019, 4:38 PM

## 2019-03-09 ENCOUNTER — Encounter (INDEPENDENT_AMBULATORY_CARE_PROVIDER_SITE_OTHER): Payer: Self-pay

## 2019-03-11 ENCOUNTER — Encounter (INDEPENDENT_AMBULATORY_CARE_PROVIDER_SITE_OTHER): Payer: Self-pay

## 2019-03-12 ENCOUNTER — Other Ambulatory Visit: Payer: Self-pay

## 2019-03-12 ENCOUNTER — Inpatient Hospital Stay (HOSPITAL_COMMUNITY)
Admission: EM | Admit: 2019-03-12 | Discharge: 2019-03-17 | DRG: 803 | Disposition: A | Discharge status: Home / Self Care | Payer: BC Managed Care – PPO | Attending: Internal Medicine | Admitting: Internal Medicine

## 2019-03-12 ENCOUNTER — Encounter (INDEPENDENT_AMBULATORY_CARE_PROVIDER_SITE_OTHER): Payer: Self-pay

## 2019-03-12 ENCOUNTER — Encounter (HOSPITAL_COMMUNITY): Payer: Self-pay | Admitting: Emergency Medicine

## 2019-03-12 DIAGNOSIS — I82409 Acute embolism and thrombosis of unspecified deep veins of unspecified lower extremity: Secondary | ICD-10-CM

## 2019-03-12 DIAGNOSIS — E785 Hyperlipidemia, unspecified: Secondary | ICD-10-CM | POA: Diagnosis present

## 2019-03-12 DIAGNOSIS — Z7901 Long term (current) use of anticoagulants: Secondary | ICD-10-CM

## 2019-03-12 DIAGNOSIS — R040 Epistaxis: Secondary | ICD-10-CM | POA: Diagnosis present

## 2019-03-12 DIAGNOSIS — D62 Acute posthemorrhagic anemia: Secondary | ICD-10-CM | POA: Diagnosis not present

## 2019-03-12 DIAGNOSIS — I10 Essential (primary) hypertension: Secondary | ICD-10-CM | POA: Diagnosis present

## 2019-03-12 DIAGNOSIS — Z7951 Long term (current) use of inhaled steroids: Secondary | ICD-10-CM

## 2019-03-12 DIAGNOSIS — R7303 Prediabetes: Secondary | ICD-10-CM | POA: Diagnosis present

## 2019-03-12 DIAGNOSIS — D72829 Elevated white blood cell count, unspecified: Secondary | ICD-10-CM

## 2019-03-12 DIAGNOSIS — Z882 Allergy status to sulfonamides status: Secondary | ICD-10-CM

## 2019-03-12 DIAGNOSIS — Z79899 Other long term (current) drug therapy: Secondary | ICD-10-CM

## 2019-03-12 DIAGNOSIS — Z7982 Long term (current) use of aspirin: Secondary | ICD-10-CM

## 2019-03-12 DIAGNOSIS — Z86718 Personal history of other venous thrombosis and embolism: Secondary | ICD-10-CM

## 2019-03-12 DIAGNOSIS — Z9079 Acquired absence of other genital organ(s): Secondary | ICD-10-CM

## 2019-03-12 DIAGNOSIS — R042 Hemoptysis: Secondary | ICD-10-CM | POA: Diagnosis not present

## 2019-03-12 DIAGNOSIS — I82452 Acute embolism and thrombosis of left peroneal vein: Secondary | ICD-10-CM | POA: Diagnosis present

## 2019-03-12 DIAGNOSIS — I82443 Acute embolism and thrombosis of tibial vein, bilateral: Secondary | ICD-10-CM | POA: Diagnosis present

## 2019-03-12 DIAGNOSIS — Z8546 Personal history of malignant neoplasm of prostate: Secondary | ICD-10-CM

## 2019-03-12 DIAGNOSIS — Z8619 Personal history of other infectious and parasitic diseases: Secondary | ICD-10-CM

## 2019-03-12 MED ORDER — SODIUM CHLORIDE 0.9 % IV BOLUS
1000.0000 mL | Freq: Once | INTRAVENOUS | Status: AC
  Administered 2019-03-13: 1000 mL via INTRAVENOUS

## 2019-03-12 MED ORDER — TRANEXAMIC ACID 1000 MG/10ML IV SOLN
500.0000 mg | Freq: Once | INTRAVENOUS | Status: AC
  Administered 2019-03-13: 500 mg via TOPICAL
  Filled 2019-03-12: qty 10

## 2019-03-12 NOTE — ED Notes (Signed)
Nose bleeding has restarted

## 2019-03-12 NOTE — ED Provider Notes (Signed)
[facility] DEPT Provider Note   CSN: [id number] Arrival date & time: 03/12/19  2147    History   Chief Complaint Chief Complaint  Patient presents with  . Epistaxis    HPI Ronald Arnold is a 63 y.o. male.   The history is provided by the patient.  He has history of hypertension, hyperlipidemia, prediabetes, prostate cancer and recent episode of COVID-19 infection and comes in because of left-sided epistaxis for the last 3 days.  Of note, he was diagnosed with DVT 4 days ago and started on rivaroxaban.  He has had problems with nosebleeds in the past, but they have always been self-limited.  He has had significant bleeds each day but has always been able to control with direct pressure until this evening.  He denies any trauma denies aspirin use since starting on rivaroxaban.  He had been taking aspirin prior to being on rivaroxaban.  Past Medical History:  Diagnosis Date  . Cancer [facility])    prostate cancer   . COVID-19 virus infection     Patient Active Problem List   Diagnosis Date Noted  . Pneumonia due to COVID-19 virus 02/21/2019  . HTN (hypertension) 02/21/2019  . HLD (hyperlipidemia) 02/21/2019  . Acute respiratory failure with hypoxia ([place]) 02/21/2019  . Prediabetes 02/21/2019  . Hypoxia   . Lower respiratory tract infection due to COVID-19 virus 02/17/2019  . Prostate cancer ([place]) 06/20/2014    Past Surgical History:  Procedure Laterality Date  . LYMPHADENECTOMY Bilateral 06/20/2014   Procedure: LYMPHADENECTOMY;  Surgeon: Raynelle Bring, MD;  Location: [facility] ORS;  Service: Urology;  Laterality: Bilateral;  . ROBOT ASSISTED LAPAROSCOPIC RADICAL PROSTATECTOMY N/A 06/20/2014   Procedure: ROBOTIC ASSISTED LAPAROSCOPIC RADICAL PROSTATECTOMY LEVEL 2;  Surgeon: Raynelle Bring, MD;  Location: [facility] ORS;  Service: Urology;  Laterality: N/A;  . TONSILLECTOMY     to correct sleep apnea         Home Medications    Prior to Admission  medications   Medication Sig Start Date End Date Taking? Authorizing Provider  acetaminophen (TYLENOL) 325 MG tablet Take 2 tablets (650 mg total) by mouth every 6 (six) hours as needed for mild pain (or Fever >/= 101). 03/04/19   Cherene Altes, MD  albuterol (VENTOLIN HFA) 108 (90 Base) MCG/ACT inhaler Inhale 1-2 puffs into the lungs every 4 (four) hours as needed for wheezing or shortness of breath. 03/04/19   Cherene Altes, MD  Ascorbic Acid (VITAMIN C) 1000 MG tablet Take 1,000 mg by mouth daily.    [provider]  aspirin EC 81 MG tablet Take 81 mg by mouth daily.    [provider]  benzonatate (TESSALON) 200 MG capsule Take 1 capsule (200 mg total) by mouth 3 (three) times daily as needed for cough. 03/04/19   Cherene Altes, MD  chlorpheniramine-HYDROcodone (TUSSIONEX) 10-8 MG/5ML SUER Take 5 mLs by mouth every 12 (twelve) hours as needed for cough. 03/04/19   Cherene Altes, MD  Cholecalciferol (VITAMIN D-3) 25 MCG (1000 UT) CAPS Take 2,000 Units by mouth daily.    [provider]  fexofenadine (ALLEGRA) 180 MG tablet Take 180 mg by mouth daily.    [provider]  fluticasone (FLONASE) 50 MCG/ACT nasal spray Place into both nostrils daily.    [provider]  Multiple Vitamin (MULTIVITAMIN WITH MINERALS) TABS tablet Take 1 tablet by mouth daily.    [provider]  rosuvastatin (CRESTOR) 5 MG tablet Take 5  mg by mouth daily. 12/19/18   [provider]    Family History History reviewed. No pertinent family history.  Social History Social History   Tobacco Use  . Smoking status: Never Smoker  . Smokeless tobacco: Never Used  Substance Use Topics  . Alcohol use: Yes    Comment: occasional beer or wine   . Drug use: No     Allergies   Sulfa antibiotics   Review of Systems Review of Systems  All other systems reviewed and are negative.    Physical Exam Updated Vital Signs BP 103/87   Pulse  (!) 145   Temp 97.7 F (36.5 C) (Oral)   Resp (!) 27   Ht 5\' 10"  (1.778 m)   Wt 99.8 kg   SpO2 96%   BMI 31.57 kg/m   Physical Exam Vitals signs and nursing note reviewed.    63 year old male, pale and mildly diaphoretic with mild dyspnea. Vital signs are significant for rapid heart rate and respiratory rate. Oxygen saturation is 96%, which is normal. Head is normocephalic and atraumatic. PERRLA, EOMI. Oropharynx is clear.  Active bleeding site noted on the left nostril in Kiesselbach's plexus. Neck is nontender and supple without adenopathy or JVD. Back is nontender and there is no CVA tenderness. Lungs are clear without rales, wheezes, or rhonchi. Chest is nontender. Heart has regular rate and rhythm without murmur. Abdomen is soft, flat, nontender without masses or hepatosplenomegaly and peristalsis is normoactive. Extremities have no cyanosis or edema, full range of motion is present. Skin is mildly diaphoretic without rash. Neurologic: Mental status is normal, cranial nerves are intact, there are no motor or sensory deficits.  ED Treatments / Results  Labs (all labs ordered are listed, but only abnormal results are displayed) Labs Reviewed  BASIC METABOLIC PANEL - Abnormal; Notable for the following components:      Result Value   Potassium 5.2 (*)    CO2 19 (*)    Glucose, Bld 250 (*)    BUN 37 (*)    Calcium 8.4 (*)    All other components within normal limits  CBC WITH DIFFERENTIAL/PLATELET    EKG None  Radiology No results found.  Procedures .Epistaxis Management  Date/Time: 03/13/2019 1:10 AM Performed by: Delora Fuel, MD Authorized by: Delora Fuel, MD   Consent:    Consent obtained:  Verbal   Consent given by:  Patient   Risks discussed:  Bleeding   Alternatives discussed:  Alternative treatment Anesthesia (see MAR for exact dosages):    Anesthesia method:  None Procedure details:    Treatment site:  L anterior   Treatment method:  Anterior  pack (Saturated with tranexamic acid)   Treatment complexity:  Limited   Treatment episode: initial   Post-procedure details:    Assessment:  Bleeding stopped   Patient tolerance of procedure:  Tolerated well, no immediate complications   CRITICAL CARE Performed by: Delora Fuel Total critical care time: 45 minutes Critical care time was exclusive of separately billable procedures and treating other patients. Critical care was necessary to treat or prevent imminent or life-threatening deterioration. Critical care was time spent personally by me on the following activities: development of treatment plan with patient and/or surrogate as well as nursing, discussions with consultants, evaluation of patient's response to treatment, examination of patient, obtaining history from patient or surrogate, ordering and performing treatments and interventions, ordering and review of laboratory studies, ordering and review of radiographic studies, pulse oximetry and re-evaluation  of patient's condition.  Medications Ordered in ED Medications - No data to display   Initial Impression / Assessment and Plan / ED Course  I have reviewed the triage vital signs and the nursing notes.  Pertinent labs & imaging results that were available during my care of the patient were reviewed by me and considered in my medical decision making (see chart for details).  Epistaxis and patient who is anticoagulated on rivaroxaban.  Although he stopped aspirin, he still has residual platelet inhibition from recent aspirin use.  I am concerned about his tachycardia and pallor, so we will check CBC.  Will be given IV fluids.  Will attempt to control epistaxis with topical tranexamic acid.  Old records reviewed confirming recent hospitalization for COVID-19.  Of note, his baseline blood pressure is approximately 123456 systolic, current blood pressure is below that baseline.  2:24 AM Epistaxis has been controlled, but he continues to  be tachycardic.  He is no longer diaphoretic, and blood pressure has come up.  He will be given additional IV fluids.  5:13 AM Hemoglobin was delayed but came back 11.5, which is a drop of 4 g compared with 10 days ago.  While patient was being observed, he suddenly vomited a large amount of blood.  He actually was not having any bleeding from his original bleeding site, and this is felt to be blood that he had swallowed previously.  However, he is having ongoing tachycardia.  He has been given additional IV fluid.  He will need to be admitted for observation.  Case is discussed with Dr. Marlowe Sax of [facility] who agrees to admit the patient.  Case also discussed with Dr.Shoemaker ENT who states that he will be glad to see the patient if bleeding recurs.  Final Clinical Impressions(s) / ED Diagnoses   Final diagnoses:  Left-sided epistaxis  Anemia associated with acute blood loss    ED Discharge Orders    None       Delora Fuel, MD [id number] [phone]

## 2019-03-12 NOTE — ED Triage Notes (Signed)
Pt arriving for epistaxis. Pt reports he has had this happen before and is typically controlled with Affrin or cotton inserted into nare but was having trouble getting the bleeding to stop. Pt has bleeding controlled at this time after holding compression while on his way here. Pt A&O x4.

## 2019-03-13 ENCOUNTER — Other Ambulatory Visit: Payer: Self-pay

## 2019-03-13 DIAGNOSIS — I82452 Acute embolism and thrombosis of left peroneal vein: Secondary | ICD-10-CM | POA: Diagnosis present

## 2019-03-13 DIAGNOSIS — I10 Essential (primary) hypertension: Secondary | ICD-10-CM | POA: Diagnosis present

## 2019-03-13 DIAGNOSIS — R7303 Prediabetes: Secondary | ICD-10-CM | POA: Diagnosis present

## 2019-03-13 DIAGNOSIS — R042 Hemoptysis: Secondary | ICD-10-CM | POA: Diagnosis not present

## 2019-03-13 DIAGNOSIS — Z86718 Personal history of other venous thrombosis and embolism: Secondary | ICD-10-CM | POA: Diagnosis not present

## 2019-03-13 DIAGNOSIS — D62 Acute posthemorrhagic anemia: Secondary | ICD-10-CM | POA: Diagnosis present

## 2019-03-13 DIAGNOSIS — Z79899 Other long term (current) drug therapy: Secondary | ICD-10-CM | POA: Diagnosis not present

## 2019-03-13 DIAGNOSIS — I82443 Acute embolism and thrombosis of tibial vein, bilateral: Secondary | ICD-10-CM | POA: Diagnosis present

## 2019-03-13 DIAGNOSIS — R040 Epistaxis: Secondary | ICD-10-CM

## 2019-03-13 DIAGNOSIS — M79609 Pain in unspecified limb: Secondary | ICD-10-CM | POA: Diagnosis not present

## 2019-03-13 DIAGNOSIS — Z8546 Personal history of malignant neoplasm of prostate: Secondary | ICD-10-CM | POA: Diagnosis not present

## 2019-03-13 DIAGNOSIS — Z7951 Long term (current) use of inhaled steroids: Secondary | ICD-10-CM | POA: Diagnosis not present

## 2019-03-13 DIAGNOSIS — Z882 Allergy status to sulfonamides status: Secondary | ICD-10-CM | POA: Diagnosis not present

## 2019-03-13 DIAGNOSIS — Z7901 Long term (current) use of anticoagulants: Secondary | ICD-10-CM | POA: Diagnosis not present

## 2019-03-13 DIAGNOSIS — I82409 Acute embolism and thrombosis of unspecified deep veins of unspecified lower extremity: Secondary | ICD-10-CM

## 2019-03-13 DIAGNOSIS — D72829 Elevated white blood cell count, unspecified: Secondary | ICD-10-CM | POA: Diagnosis present

## 2019-03-13 DIAGNOSIS — E785 Hyperlipidemia, unspecified: Secondary | ICD-10-CM | POA: Diagnosis present

## 2019-03-13 DIAGNOSIS — Z9079 Acquired absence of other genital organ(s): Secondary | ICD-10-CM | POA: Diagnosis not present

## 2019-03-13 DIAGNOSIS — Z7982 Long term (current) use of aspirin: Secondary | ICD-10-CM | POA: Diagnosis not present

## 2019-03-13 DIAGNOSIS — Z8619 Personal history of other infectious and parasitic diseases: Secondary | ICD-10-CM | POA: Diagnosis not present

## 2019-03-13 LAB — CBC WITH DIFFERENTIAL/PLATELET
Abs Immature Granulocytes: 0.1 10*3/uL — ABNORMAL HIGH (ref 0.00–0.07)
Abs Immature Granulocytes: 0.22 10*3/uL — ABNORMAL HIGH (ref 0.00–0.07)
Basophils Absolute: 0 10*3/uL (ref 0.0–0.1)
Basophils Absolute: 0 10*3/uL (ref 0.0–0.1)
Basophils Relative: 0 %
Basophils Relative: 0 %
Eosinophils Absolute: 0.1 10*3/uL (ref 0.0–0.5)
Eosinophils Absolute: 0 10*3/uL (ref 0.0–0.5)
Eosinophils Relative: 1 %
Eosinophils Relative: 0 %
HCT: 35.5 % — ABNORMAL LOW (ref 39.0–52.0)
HCT: 24.2 % — ABNORMAL LOW (ref 39.0–52.0)
Hemoglobin: 11.5 g/dL — ABNORMAL LOW (ref 13.0–17.0)
Hemoglobin: 7.3 g/dL — ABNORMAL LOW (ref 13.0–17.0)
Immature Granulocytes: 1 %
Immature Granulocytes: 2 %
Lymphocytes Relative: 15 %
Lymphocytes Relative: 19 %
Lymphs Abs: 2.1 10*3/uL (ref 0.7–4.0)
Lymphs Abs: 2.7 10*3/uL (ref 0.7–4.0)
MCH: 32.1 pg (ref 26.0–34.0)
MCH: 32.2 pg (ref 26.0–34.0)
MCHC: 32.4 g/dL (ref 30.0–36.0)
MCHC: 30.2 g/dL (ref 30.0–36.0)
MCV: 99.2 fL (ref 80.0–100.0)
MCV: 106.6 fL — ABNORMAL HIGH (ref 80.0–100.0)
Monocytes Absolute: 0.8 10*3/uL (ref 0.1–1.0)
Monocytes Absolute: 0.5 10*3/uL (ref 0.1–1.0)
Monocytes Relative: 5 %
Monocytes Relative: 4 %
Neutro Abs: 11.2 10*3/uL — ABNORMAL HIGH (ref 1.7–7.7)
Neutro Abs: 10.8 10*3/uL — ABNORMAL HIGH (ref 1.7–7.7)
Neutrophils Relative %: 78 %
Neutrophils Relative %: 75 %
Platelets: 240 10*3/uL (ref 150–400)
Platelets: 183 10*3/uL (ref 150–400)
RBC: 3.58 MIL/uL — ABNORMAL LOW (ref 4.22–5.81)
RBC: 2.27 MIL/uL — ABNORMAL LOW (ref 4.22–5.81)
RDW: 14.3 % (ref 11.5–15.5)
RDW: 14.5 % (ref 11.5–15.5)
WBC: 14.2 10*3/uL — ABNORMAL HIGH (ref 4.0–10.5)
WBC: 14.3 10*3/uL — ABNORMAL HIGH (ref 4.0–10.5)
nRBC: 0 % (ref 0.0–0.2)
nRBC: 0 % (ref 0.0–0.2)

## 2019-03-13 LAB — CBC
HCT: 25.7 % — ABNORMAL LOW (ref 39.0–52.0)
Hemoglobin: 8.4 g/dL — ABNORMAL LOW (ref 13.0–17.0)
MCH: 31.2 pg (ref 26.0–34.0)
MCHC: 32.7 g/dL (ref 30.0–36.0)
MCV: 95.5 fL (ref 80.0–100.0)
Platelets: 108 10*3/uL — ABNORMAL LOW (ref 150–400)
RBC: 2.69 MIL/uL — ABNORMAL LOW (ref 4.22–5.81)
RDW: 15.8 % — ABNORMAL HIGH (ref 11.5–15.5)
WBC: 11.8 10*3/uL — ABNORMAL HIGH (ref 4.0–10.5)
nRBC: 0 % (ref 0.0–0.2)

## 2019-03-13 LAB — PREPARE RBC (CROSSMATCH)

## 2019-03-13 LAB — BASIC METABOLIC PANEL
Anion gap: 12 (ref 5–15)
BUN: 37 mg/dL — ABNORMAL HIGH (ref 8–23)
CO2: 19 mmol/L — ABNORMAL LOW (ref 22–32)
Calcium: 8.4 mg/dL — ABNORMAL LOW (ref 8.9–10.3)
Chloride: 105 mmol/L (ref 98–111)
Creatinine, Ser: 0.93 mg/dL (ref 0.61–1.24)
GFR calc Af Amer: >60 mL/min (ref >60)
GFR calc non Af Amer: >60 mL/min (ref >60)
Glucose, Bld: 250 mg/dL — ABNORMAL HIGH (ref 70–99)
Potassium: 5.2 mmol/L — ABNORMAL HIGH (ref 3.5–5.1)
Sodium: 136 mmol/L (ref 135–145)

## 2019-03-13 LAB — GLUCOSE, CAPILLARY: Glucose-Capillary: 99 mg/dL (ref 70–99)

## 2019-03-13 LAB — SARS CORONAVIRUS 2 (TAT 6-24 HRS): SARS Coronavirus 2: NEGATIVE

## 2019-03-13 MED ORDER — INSULIN ASPART 100 UNIT/ML ~~LOC~~ SOLN
0.0000 [IU] | Freq: Three times a day (TID) | SUBCUTANEOUS | Status: DC
  Administered 2019-03-14 – 2019-03-17 (×2): 1 [IU] via SUBCUTANEOUS
  Filled 2019-03-13: qty 0.09

## 2019-03-13 MED ORDER — ACETAMINOPHEN 650 MG RE SUPP: 650.0000 mg | Freq: Four times a day (QID) | RECTAL | Status: DC | PRN

## 2019-03-13 MED ORDER — SODIUM CHLORIDE 0.9 % IV SOLN
INTRAVENOUS | Status: AC
  Administered 2019-03-13 (×3): via INTRAVENOUS

## 2019-03-13 MED ORDER — LIP MEDEX EX OINT
TOPICAL_OINTMENT | CUTANEOUS | Status: DC | PRN
  Filled 2019-03-13: qty 7

## 2019-03-13 MED ORDER — ORAL CARE MOUTH RINSE: 15.0000 mL | Freq: Two times a day (BID) | OROMUCOSAL | Status: DC

## 2019-03-13 MED ORDER — BENZONATATE 100 MG PO CAPS
200.0000 mg | ORAL_CAPSULE | Freq: Three times a day (TID) | ORAL | Status: DC | PRN
  Administered 2019-03-13 – 2019-03-15 (×3): 200 mg via ORAL
  Filled 2019-03-13 (×3): qty 2

## 2019-03-13 MED ORDER — SODIUM CHLORIDE 0.9 % IV BOLUS
500.0000 mL | Freq: Once | INTRAVENOUS | Status: AC
  Administered 2019-03-13: 500 mL via INTRAVENOUS

## 2019-03-13 MED ORDER — ACETAMINOPHEN 325 MG PO TABS: 650.0000 mg | ORAL_TABLET | Freq: Four times a day (QID) | ORAL | Status: DC | PRN

## 2019-03-13 MED ORDER — CHLORHEXIDINE GLUCONATE CLOTH 2 % EX PADS
6.0000 | MEDICATED_PAD | Freq: Every day | CUTANEOUS | Status: DC
  Administered 2019-03-13 – 2019-03-14 (×2): 6 via TOPICAL

## 2019-03-13 MED ORDER — SODIUM CHLORIDE 0.9% IV SOLUTION: Freq: Once | INTRAVENOUS | Status: DC

## 2019-03-13 MED ORDER — OXYMETAZOLINE HCL 0.05 % NA SOLN
1.0000 | Freq: Two times a day (BID) | NASAL | Status: DC | PRN
  Administered 2019-03-15: 23:00:00 1 via NASAL
  Filled 2019-03-13: qty 15

## 2019-03-13 MED ORDER — HYDROCOD POLST-CPM POLST ER 10-8 MG/5ML PO SUER
5.0000 mL | Freq: Two times a day (BID) | ORAL | Status: DC | PRN
  Administered 2019-03-13 – 2019-03-15 (×3): 5 mL via ORAL
  Filled 2019-03-13 (×3): qty 5

## 2019-03-13 MED ORDER — SODIUM CHLORIDE 0.9 % IV BOLUS
1000.0000 mL | Freq: Once | INTRAVENOUS | Status: AC
  Administered 2019-03-13: 03:00:00 1000 mL via INTRAVENOUS

## 2019-03-13 MED ORDER — ORAL CARE MOUTH RINSE
15.0000 mL | Freq: Two times a day (BID) | OROMUCOSAL | Status: DC
  Administered 2019-03-13 – 2019-03-17 (×8): 15 mL via OROMUCOSAL

## 2019-03-13 NOTE — Progress Notes (Signed)
Patient is seen and examined. Please see today's H&P for the details. 63 y.o. male with medical history significant of prostate cancer status post prostatectomy, history of DVT in 2015, prediabetes, recent admission 8/1-8/16 for pneumonia secondary to COVID-19 viral infection, recent diagnosis of left lower extremity DVT on 8/20 started on rivaroxaban presenting to the hospital for evaluation of epistaxis.   Acute blood loss anemia. Due to Epistaxis. Patient is admitted to step don unit. Cont close monitor. No further epistaxis at this time. TFsing 2Units PRBCs. Will obtain serial h/h, TF as needed. Wean from nasal canula oxygen (likely irritating and causing mechanical bleed/epitaxis, apply Vaseline into nostril.) will consult ENT again if needed/bleeding  Recent DVT. Reported left calf DVT. Report is not available. Will obtain doppler. D/w patient, will consider IVC filter if persistent bleeding and positive doppler for DVT  Recent COVID PNA. No acute cardiopulmonary symptoms. No acute SOB, no chest pains. Wean oxygen.   D/w patient, his family, RN Kinnie Feil

## 2019-03-13 NOTE — ED Notes (Signed)
Large BM with obvious blood products noted. Pt cleaned and provided new bedsheets. Will make hospitalist aware and attempt to call report to ICU at this time.

## 2019-03-13 NOTE — H&P (Signed)
History and Physical    Ronald Arnold [id number] DOB: 1956/04/28 DOA: 03/12/2019  PCP: Lawerance Cruel, MD Patient coming from: Home  Chief Complaint: Epistaxis  HPI: Ronald Arnold is a 64 y.o. male with medical history significant of prostate cancer status post prostatectomy, history of DVT in 2015, prediabetes, recent admission 8/1-8/16 for pneumonia secondary to COVID-19 viral infection, recent diagnosis of left lower extremity DVT on 8/20 started on rivaroxaban presenting to the hospital for evaluation of epistaxis.  Patient states he has had 4 episodes of large-volume nosebleeds from his left nostril in the past 3 days.  Denies any injury to his nose.  States he stopped taking aspirin since he was recently diagnosed with DVT and started on rivaroxaban.  Denies any abdominal pain, chest pain, shortness of breath, cough, dizziness/lightheadedness, or episodes of syncope.  No other complaints.  ED Course: Tachycardic, systolic as low as 0000000.  White count 14.2.  Hemoglobin 11.5, 4 g drop from labs done 10 days ago.  COVID-19 test pending.  Epistaxis was controlled with topical tranexamic acid.  Patient subsequently vomited a large amount of blood in the ED which was felt to be related to his epistaxis.  ED provider discussed the case with Dr. Wilburn Cornelia from ENT, no additional recommendations at this time since epistaxis was controlled with tranexamic acid.  ENT recommended reconsulting if patient has recurrence of epistaxis.  Blood pressure improved with 2 L fluid boluses.  Review of Systems:  All systems reviewed and apart from history of presenting illness, are negative.  Past Medical History:  Diagnosis Date   Cancer [facility])    prostate cancer    COVID-19 virus infection     Past Surgical History:  Procedure Laterality Date   LYMPHADENECTOMY Bilateral 06/20/2014   Procedure: LYMPHADENECTOMY;  Surgeon: Raynelle Bring, MD;  Location: [facility];  Service: Urology;  Laterality:  Bilateral;   ROBOT ASSISTED LAPAROSCOPIC RADICAL PROSTATECTOMY N/A 06/20/2014   Procedure: ROBOTIC ASSISTED LAPAROSCOPIC RADICAL PROSTATECTOMY LEVEL 2;  Surgeon: Raynelle Bring, MD;  Location: [facility];  Service: Urology;  Laterality: N/A;   TONSILLECTOMY     to correct sleep apnea      reports that he has never smoked. He has never used smokeless tobacco. He reports current alcohol use. He reports that he does not use drugs.  Allergies  Allergen Reactions   Sulfa Antibiotics Other (See Comments)    As a child patient is unaware of reaction    History reviewed. No pertinent family history.  Prior to Admission medications   Medication Sig Start Date End Date Taking? Authorizing Provider  acetaminophen (TYLENOL) 325 MG tablet Take 2 tablets (650 mg total) by mouth every 6 (six) hours as needed for mild pain (or Fever >/= 101). 03/04/19   Cherene Altes, MD  albuterol (VENTOLIN HFA) 108 (90 Base) MCG/ACT inhaler Inhale 1-2 puffs into the lungs every 4 (four) hours as needed for wheezing or shortness of breath. 03/04/19   Cherene Altes, MD  Ascorbic Acid (VITAMIN C) 1000 MG tablet Take 1,000 mg by mouth daily.    [provider]  aspirin EC 81 MG tablet Take 81 mg by mouth daily.    [provider]  benzonatate (TESSALON) 200 MG capsule Take 1 capsule (200 mg total) by mouth 3 (three) times daily as needed for cough. 03/04/19   Cherene Altes, MD  chlorpheniramine-HYDROcodone (TUSSIONEX) 10-8 MG/5ML SUER Take 5 mLs by mouth every 12 (twelve) hours as needed for  cough. 03/04/19   Cherene Altes, MD  Cholecalciferol (VITAMIN D-3) 25 MCG (1000 UT) CAPS Take 2,000 Units by mouth daily.    [provider]  fexofenadine (ALLEGRA) 180 MG tablet Take 180 mg by mouth daily.    [provider]  fluticasone (FLONASE) 50 MCG/ACT nasal spray Place into both nostrils daily.    [provider]  Multiple Vitamin (MULTIVITAMIN WITH MINERALS) TABS tablet  Take 1 tablet by mouth daily.    [provider]  rosuvastatin (CRESTOR) 5 MG tablet Take 5 mg by mouth daily. 12/19/18   [provider]    Physical Exam: Vitals:   03/13/19 0445 03/13/19 0500 03/13/19 0600 03/13/19 0615  BP: 100/69 107/70 110/60 103/73  Pulse: (!) 129 (!) 131 (!) 128 (!) 119  Resp: (!) 26 (!) 27 (!) 21 20  Temp:      TempSrc:      SpO2: (!) 89% 95% 94% 100%  Weight:      Height:        Physical Exam  Constitutional: He is oriented to person, place, and time. He appears well-developed and well-nourished.  HENT:  Head: Normocephalic.  Dried blood noted around the nostrils, no active epistaxis Dried blood noted around the mouth  Eyes: Right eye exhibits no discharge. Left eye exhibits no discharge.  Neck: Neck supple.  Cardiovascular: Normal rate, regular rhythm and intact distal pulses.  Pulmonary/Chest: Effort normal and breath sounds normal. No respiratory distress. He has no wheezes. He has no rales.  Abdominal: Soft. Bowel sounds are normal. He exhibits no distension. There is no abdominal tenderness. There is no guarding.  Musculoskeletal:        General: No edema.     Comments: Left lower extremity appears slightly larger in size compared to the contralateral side  Neurological: He is alert and oriented to person, place, and time.  Skin: Skin is warm and dry. He is not diaphoretic.     Labs on Admission: I have personally reviewed following labs and imaging studies  CBC: Recent Labs  Lab 03/13/19 0349 03/13/19 0538  WBC 14.2* 14.3*  NEUTROABS 11.2* 10.8*  HGB 11.5* 7.3*  HCT 35.5* 24.2*  MCV 99.2 106.6*  PLT 240 [id number]   Basic Metabolic Panel: Recent Labs  Lab 03/13/19 0018  NA 136  K 5.2*  CL 105  CO2 19*  GLUCOSE 250*  BUN 37*  CREATININE 0.93  CALCIUM 8.4*   GFR: Estimated Creatinine Clearance: 98.8 mL/min (by C-G formula based on SCr of 0.93 mg/dL). Liver Function Tests: No results for input(s): AST, ALT,  ALKPHOS, BILITOT, PROT, ALBUMIN in the last 168 hours. No results for input(s): LIPASE, AMYLASE in the last 168 hours. No results for input(s): AMMONIA in the last 168 hours. Coagulation Profile: No results for input(s): INR, PROTIME in the last 168 hours. Cardiac Enzymes: No results for input(s): CKTOTAL, CKMB, CKMBINDEX, TROPONINI in the last 168 hours. BNP (last 3 results) No results for input(s): PROBNP in the last 8760 hours. HbA1C: No results for input(s): HGBA1C in the last 72 hours. CBG: No results for input(s): GLUCAP in the last 168 hours. Lipid Profile: No results for input(s): CHOL, HDL, LDLCALC, TRIG, CHOLHDL, LDLDIRECT in the last 72 hours. Thyroid Function Tests: No results for input(s): TSH, T4TOTAL, FREET4, T3FREE, THYROIDAB in the last 72 hours. Anemia Panel: No results for input(s): VITAMINB12, FOLATE, FERRITIN, TIBC, IRON, RETICCTPCT in the last 72 hours. Urine analysis: No results found for: COLORURINE, APPEARANCEUR,  LABSPEC, PHURINE, GLUCOSEU, HGBUR, BILIRUBINUR, KETONESUR, PROTEINUR, UROBILINOGEN, NITRITE, LEUKOCYTESUR  Radiological Exams on Admission: No results found.  EKG: Ordered and pending at this time.  Assessment/Plan Principal Problem:   Epistaxis Active Problems:   Prediabetes   Leukocytosis   DVT (deep venous thrombosis) (HCC)   Epistaxis and acute blood loss anemia in setting of oral anticoagulant use for recent DVT Tachycardic, systolic as low as 0000000.  Blood pressure now improved with IV fluid boluses.  Initial hemoglobin checked in the ED 11.5, repeat down to 7.3.  Hemoglobin was 15.3 on 8/15.  Epistaxis was controlled with topical tranexamic acid.  Patient subsequently vomited a large amount of blood in the ED which was felt to be related to his epistaxis. ED provider discussed the case with Dr. Wilburn Cornelia from ENT, no additional recommendations at this time since epistaxis has been controlled.  ENT recommended reconsulting if patient has  recurrence of epistaxis.  -Monitor closely in the stepdown unit -Type and screen -IV fluid resuscitation -2 units PRBCs -Serial CBCs -Hold rivaroxaban -No active epistaxis at this time.  Patient will need recombinant factor Xa for reversal of rivaroxaban if there is recurrence of epistaxis.  Leukocytosis Possibly reactive.  Patient is afebrile.  White count 14.2.  Not endorsing any UTI or pneumonia symptoms. -Continue to monitor CBC  Prediabetes Hemoglobin A1c 6.2 on 8/4.  CBG 250. -Sliding scale insulin sensitive and CBG checks  Left lower extremity DVT diagnosed 8/20 in the setting of recent hospital admission for COVID-19 viral pneumonia -Hold rivaroxaban in the setting of epistaxis and acute blood loss anemia  DVT prophylaxis: Hold anticoagulation at this time.  Avoid SCDs in the setting of known DVT. Code Status: Patient wishes to be full code. Family Communication: No family available at this time. Disposition Plan: Anticipate discharge after clinical improvement. Consults called: ENT Admission status: It is my clinical opinion that referral for OBSERVATION is reasonable and necessary in this patient based on the above information provided. The aforementioned taken together are felt to place the patient at high risk for further clinical deterioration. However it is anticipated that the patient may be medically stable for discharge from the hospital within 24 to 48 hours.  The medical decision making on this patient was of high complexity and the patient is at high risk for clinical deterioration, therefore this is a level 3 visit.  Shela Leff MD [facility] Pager [phone]  If 7PM-7AM, please contact night-coverage [email or web address] Password [facility]  03/13/2019, 6:55 AM

## 2019-03-13 NOTE — Plan of Care (Signed)
Pt received 2units of PRBCs and will have repeat CBC to keep hgb trending. Pt had 1 episode of bloody stool on unit.

## 2019-03-13 NOTE — ED Notes (Signed)
Pt denies any reactions to blood transfusion at this time, rate increased to 215ml/hr. Will continue to monitor and observe. Will attempt to recall ICU for report.

## 2019-03-13 NOTE — ED Notes (Signed)
ED TO INPATIENT HANDOFF REPORT  ED Nurse Name and Phone #: Nadean Corwin, RN S Name/Age/Gender Ronald Arnold 63 y.o. male Room/Bed: WA18/WA18  Code Status   Code Status: Full Code  Home/SNF/Other Home Patient oriented to: self, place, time and situation Is this baseline? Yes   Triage Complete: Triage complete  Chief Complaint Nose Bleed (covid +)  Triage Note Pt arriving for epistaxis. Pt reports he has had this happen before and is typically controlled with Affrin or cotton inserted into nare but was having trouble getting the bleeding to stop. Pt has bleeding controlled at this time after holding compression while on his way here. Pt A&O x4.    Allergies Allergies  Allergen Reactions  . Sulfa Antibiotics Other (See Comments)    As a child patient is unaware of reaction    Level of Care/Admitting Diagnosis ED Disposition    ED Disposition Condition [place]: [place] [100102]  Level of Care: Stepdown [14]  Admit to SDU based on following criteria: Hemodynamic compromise or significant risk of instability:  Patient requiring short term acute titration and management of vasoactive drips, and invasive monitoring (i.e., CVP and Arterial line).  Covid Evaluation: Person Under Investigation (PUI)  Diagnosis: Epistaxis [place].7.ICD-9-CM]  Admitting Physician: Shela Leff [id number]  Attending Physician: Shela Leff [id number]  PT Class (Do Not Modify): Observation [104]  PT Acc Code (Do Not Modify): Observation [10022]       B Medical/Surgery History Past Medical History:  Diagnosis Date  . Cancer [facility])    prostate cancer   . COVID-19 virus infection    Past Surgical History:  Procedure Laterality Date  . LYMPHADENECTOMY Bilateral 06/20/2014   Procedure: LYMPHADENECTOMY;  Surgeon: Raynelle Bring, MD;  Location: [facility];  Service: Urology;  Laterality: Bilateral;  . ROBOT ASSISTED LAPAROSCOPIC RADICAL  PROSTATECTOMY N/A 06/20/2014   Procedure: ROBOTIC ASSISTED LAPAROSCOPIC RADICAL PROSTATECTOMY LEVEL 2;  Surgeon: Raynelle Bring, MD;  Location: [facility];  Service: Urology;  Laterality: N/A;  . TONSILLECTOMY     to correct sleep apnea      A IV Location/Drains/Wounds Patient Lines/Drains/Airways Status   Active Line/Drains/Airways    Name:   Placement date:   Placement time:   Site:   Days:   Peripheral IV 03/13/19 Left Antecubital   03/13/19    0051    Antecubital   less than 1   Peripheral IV 03/13/19 Right Antecubital   03/13/19    0700    Antecubital   less than 1          Intake/Output Last 24 hours  Intake/Output Summary (Last 24 hours) at 03/13/2019 0857 Last data filed at 03/13/2019 0820 Gross per 24 hour  Intake 1501.06 ml  Output -  Net 1501.06 ml    Labs/Imaging Results for orders placed or performed during the hospital encounter of 03/12/19 (from the past 48 hour(s))  Basic metabolic panel     Status: Abnormal   Collection Time: 03/13/19 12:18 AM  Result Value Ref Range   Sodium 136 135 - 145 mmol/L   Potassium 5.2 (H) 3.5 - 5.1 mmol/L   Chloride 105 98 - 111 mmol/L   CO2 19 (L) 22 - 32 mmol/L   Glucose, Bld 250 (H) 70 - 99 mg/dL   BUN 37 (H) 8 - 23 mg/dL   Creatinine, Ser 0.93 0.61 - 1.24 mg/dL   Calcium 8.4 (L) 8.9 - 10.3 mg/dL   GFR calc non  Af Amer >60 >60 mL/min   GFR calc Af Amer >60 >60 mL/min   Anion gap 12 5 - 15    Comment: Performed at [facility], [place] [address], [place], [place] [address]  CBC with Differential     Status: Abnormal   Collection Time: 03/13/19  3:49 AM  Result Value Ref Range   WBC 14.2 (H) 4.0 - 10.5 K/uL   RBC 3.58 (L) 4.22 - 5.81 MIL/uL   Hemoglobin 11.5 (L) 13.0 - 17.0 g/dL   HCT 35.5 (L) 39.0 - 52.0 %   MCV 99.2 80.0 - 100.0 fL   MCH 32.1 26.0 - 34.0 pg   MCHC 32.4 30.0 - 36.0 g/dL   RDW 14.3 11.5 - 15.5 %   Platelets 240 150 - 400 K/uL   nRBC 0.0 0.0 - 0.2 %   Neutrophils Relative % 78 %   Neutro  Abs 11.2 (H) 1.7 - 7.7 K/uL   Lymphocytes Relative 15 %   Lymphs Abs 2.1 0.7 - 4.0 K/uL   Monocytes Relative 5 %   Monocytes Absolute 0.8 0.1 - 1.0 K/uL   Eosinophils Relative 1 %   Eosinophils Absolute 0.1 0.0 - 0.5 K/uL   Basophils Relative 0 %   Basophils Absolute 0.0 0.0 - 0.1 K/uL   WBC Morphology MILD LEFT SHIFT (1-5% METAS, OCC MYELO, OCC BANDS)    Immature Granulocytes 1 %   Abs Immature Granulocytes 0.10 (H) 0.00 - 0.07 K/uL   Reactive, Benign Lymphocytes PRESENT     Comment: Performed at [facility], [place] [address], [place], [place] [address]  Type and screen [place]     Status: None (Preliminary result)   Collection Time: 03/13/19  5:35 AM  Result Value Ref Range   ABO/RH(D) O POS    Antibody Screen NEG    Sample Expiration 03/16/2019,2359    Unit Number [id number]    Blood Component Type RBC LR PHER1    Unit division 00    Status of Unit ISSUED    Transfusion Status OK TO TRANSFUSE    Crossmatch Result      Compatible Performed at [facility], [place] [address], [place], [place] [address]    Unit Number [id number]    Blood Component Type RED CELLS,LR    Unit division 00    Status of Unit ALLOCATED    Transfusion Status OK TO TRANSFUSE    Crossmatch Result Compatible   CBC with Differential     Status: Abnormal   Collection Time: 03/13/19  5:38 AM  Result Value Ref Range   WBC 14.3 (H) 4.0 - 10.5 K/uL   RBC 2.27 (L) 4.22 - 5.81 MIL/uL   Hemoglobin 7.3 (L) 13.0 - 17.0 g/dL    Comment: REPEATED TO VERIFY DELTA CHECK NOTED. RESULT CHECK AND VERIFIED WITH COLES, L. RN @ 0631 ON 8.25.2020    HCT 24.2 (L) 39.0 - 52.0 %   MCV 106.6 (H) 80.0 - 100.0 fL    Comment: REPEATED TO VERIFY DELTA CHECK NOTED RESULT CHECK AND VERIFIED WITH COLES, L. RN @0631  ON 8.25.2020    MCH 32.2 26.0 - 34.0 pg   MCHC 30.2 30.0 - 36.0 g/dL   RDW 14.5 11.5 - 15.5 %   Platelets 183 150 - 400 K/uL   nRBC 0.0 0.0 - 0.2 %    Neutrophils Relative % 75 %   Neutro Abs 10.8 (H) 1.7 - 7.7 K/uL  Lymphocytes Relative 19 %   Lymphs Abs 2.7 0.7 - 4.0 K/uL   Monocytes Relative 4 %   Monocytes Absolute 0.5 0.1 - 1.0 K/uL   Eosinophils Relative 0 %   Eosinophils Absolute 0.0 0.0 - 0.5 K/uL   Basophils Relative 0 %   Basophils Absolute 0.0 0.0 - 0.1 K/uL   WBC Morphology MILD LEFT SHIFT (1-5% METAS, OCC MYELO, OCC BANDS)    Immature Granulocytes 2 %   Abs Immature Granulocytes 0.22 (H) 0.00 - 0.07 K/uL    Comment: Performed at [facility], [place] [address] [place], [place] [address]  Prepare RBC     Status: None   Collection Time: 03/13/19  6:43 AM  Result Value Ref Range   Order Confirmation      ORDER PROCESSED BY BLOOD BANK Performed at [facility], [place] [address], [place], [place] [address]    No results found.  Pending Labs [employer] (From admission, onward)    Start     Ordered   03/14/19 [id number]  Basic metabolic panel  Tomorrow morning,   R     03/13/19 0609   03/13/19 0528  SARS CORONAVIRUS 2 (TAT 6-12 HRS) Nasal Swab Aptima Multi Swab  (Asymptomatic/Tier 2)  Once,   STAT    Question Answer Comment  Is this test for diagnosis or screening Screening   Symptomatic for COVID-19 as defined by CDC No   Hospitalized for COVID-19 No   Admitted to ICU for COVID-19 No   Previously tested for COVID-19 No   Resident in a congregate (group) care setting No   Employed in healthcare setting No      03/13/19 0527   03/13/19 0500  CBC  Now then every 4 hours,   R (with STAT occurrences)     03/13/19 0609   03/12/19 2352  CBC with Differential  Once,   STAT     03/12/19 2352          Vitals/Pain Today's Vitals   03/13/19 0825 03/13/19 0830 03/13/19 0835 03/13/19 0840  BP: 118/75 117/72 118/76 128/76  Pulse: (!) 115 (!) 117 (!) 120 (!) 118  Resp: 20 20 20 20   Temp: 99 F (37.2 C) (S) 98.6 F (37 C) (S) 98.6 F (37 C) (S) 98.7 F (37.1 C)  TempSrc: Oral  Oral Oral Oral  SpO2: 98% 98% 97% 98%  Weight:      Height:      PainSc:        Isolation Precautions Airborne and Contact precautions  Medications Medications  acetaminophen (TYLENOL) tablet 650 mg (has no administration in time range)    Or  acetaminophen (TYLENOL) suppository 650 mg (has no administration in time range)  0.9 %  sodium chloride infusion ( Intravenous New Bag/Given 03/13/19 0822)  insulin aspart (novoLOG) injection 0-9 Units (has no administration in time range)  0.9 %  sodium chloride infusion (Manually program via Guardrails IV Fluids) (has no administration in time range)  sodium chloride 0.9 % bolus 1,000 mL (0 mLs Intravenous Stopped 03/13/19 0200)  tranexamic acid (CYKLOKAPRON) injection 500 mg (500 mg Topical Given 03/13/19 0051)  sodium chloride 0.9 % bolus 1,000 mL (0 mLs Intravenous Stopped 03/13/19 0700)  sodium chloride 0.9 % bolus 500 mL (0 mLs Intravenous Stopped 03/13/19 0820)    Mobility walks with person assist High fall risk   Focused Assessments Cardiac Assessment Handoff:    No results found for: CKTOTAL, CKMB, CKMBINDEX, TROPONINI  Lab Results  Component Value Date   DDIMER 2.68 (H) 03/03/2019   Does the Patient currently have chest pain? No     R Recommendations: See Admitting Provider Note  Report given to: Megan, RN  Additional Notes: blood infusing, to receive 2nd unit

## 2019-03-13 NOTE — ED Notes (Signed)
Report called to [facility], RN with ICU and will be transported to 1421 with this RN.

## 2019-03-14 ENCOUNTER — Encounter (HOSPITAL_COMMUNITY): Payer: BC Managed Care – PPO

## 2019-03-14 LAB — GLUCOSE, CAPILLARY
Glucose-Capillary: 135 mg/dL — ABNORMAL HIGH (ref 70–99)
Glucose-Capillary: 106 mg/dL — ABNORMAL HIGH (ref 70–99)
Glucose-Capillary: 109 mg/dL — ABNORMAL HIGH (ref 70–99)
Glucose-Capillary: 89 mg/dL (ref 70–99)
Glucose-Capillary: 149 mg/dL — ABNORMAL HIGH (ref 70–99)
Glucose-Capillary: 98 mg/dL (ref 70–99)
Glucose-Capillary: 167 mg/dL — ABNORMAL HIGH (ref 70–99)

## 2019-03-14 LAB — BPAM RBC
Blood Product Expiration Date: 202009232359
Blood Product Expiration Date: 202009232359
ISSUE DATE / TIME: 202008250815
ISSUE DATE / TIME: 202008251158
Unit Type and Rh: 5100
Unit Type and Rh: 5100

## 2019-03-14 LAB — BASIC METABOLIC PANEL
Anion gap: 6 (ref 5–15)
BUN: 22 mg/dL (ref 8–23)
CO2: 23 mmol/L (ref 22–32)
Calcium: 7.9 mg/dL — ABNORMAL LOW (ref 8.9–10.3)
Chloride: 110 mmol/L (ref 98–111)
Creatinine, Ser: 0.69 mg/dL (ref 0.61–1.24)
GFR calc Af Amer: >60 mL/min (ref >60)
GFR calc non Af Amer: >60 mL/min (ref >60)
Glucose, Bld: 112 mg/dL — ABNORMAL HIGH (ref 70–99)
Potassium: 3.9 mmol/L (ref 3.5–5.1)
Sodium: 139 mmol/L (ref 135–145)

## 2019-03-14 LAB — TYPE AND SCREEN
ABO/RH(D): O POS
Antibody Screen: NEGATIVE
Unit division: 0
Unit division: 0

## 2019-03-14 LAB — CBC
HCT: 24.7 % — ABNORMAL LOW (ref 39.0–52.0)
Hemoglobin: 8.3 g/dL — ABNORMAL LOW (ref 13.0–17.0)
MCH: 31.4 pg (ref 26.0–34.0)
MCHC: 33.6 g/dL (ref 30.0–36.0)
MCV: 93.6 fL (ref 80.0–100.0)
Platelets: 154 10*3/uL (ref 150–400)
RBC: 2.64 MIL/uL — ABNORMAL LOW (ref 4.22–5.81)
RDW: 15.8 % — ABNORMAL HIGH (ref 11.5–15.5)
WBC: 11.3 10*3/uL — ABNORMAL HIGH (ref 4.0–10.5)
nRBC: 0 % (ref 0.0–0.2)

## 2019-03-14 MED ORDER — FE FUMARATE-B12-VIT C-FA-IFC PO CAPS
1.0000 | ORAL_CAPSULE | Freq: Three times a day (TID) | ORAL | Status: DC
  Administered 2019-03-14 – 2019-03-17 (×8): 1 via ORAL
  Filled 2019-03-14 (×10): qty 1

## 2019-03-14 NOTE — Consult Note (Signed)
° °Chief Complaint: °Patient was seen in consultation today for epistaxis ° °Referring Physician(s): °Dr. Buriev ° °Supervising Physician: Yamagata, Glenn ° °Patient Status: [facility] - In-pt ° °History of Present Illness: °Ronald Arnold is a 63 y.o. male with history of prostate cancer s/p prostatectomy, h/o DVT, and recent admission with pneumonia after COVID-19 viral infection 8/1-8/16.  After discharge from hospital, he complained on calf cramping and was sent for LE doppler study which revealed DVT of the left posterior tibial veins and left peroneal veins.  He was started on Xarelto at home.  He presented to ED yesterday with severe nose bleed.  The bleed was controlled with topical applications, however he did have a significant drop in HgB from his baseline as well as tachycardia.  He was admitted to SDU overnight.  His labs this AM show further decrease in Hgb to 8.3.  ° °Patient assessed at bedside. No current bleeding.  No nasal packing.  He states he has a "swipe" of blood overnight from his nose when he wiped his nose.  Has been trying not to blow his nose.  °He has been maintained on O2 since discharge from [facility] for COVID treatment and states his nares are dry.  °Remains tachycardic.  ° °Past Medical History:  °Diagnosis Date  °• Cancer (HCC)   ° prostate cancer   °• COVID-19 virus infection   ° ° °Past Surgical History:  °Procedure Laterality Date  °• LYMPHADENECTOMY Bilateral 06/20/2014  ° Procedure: LYMPHADENECTOMY;  Surgeon: Lester Borden, MD;  Location: [facility];  Service: Urology;  Laterality: Bilateral;  °• ROBOT ASSISTED LAPAROSCOPIC RADICAL PROSTATECTOMY N/A 06/20/2014  ° Procedure: ROBOTIC ASSISTED LAPAROSCOPIC RADICAL PROSTATECTOMY LEVEL 2;  Surgeon: Lester Borden, MD;  Location: [facility];  Service: Urology;  Laterality: N/A;  °• TONSILLECTOMY    ° to correct sleep apnea   ° ° °Allergies: °Sulfa antibiotics ° °Medications: °Prior to Admission medications   °Medication Sig Start Date End Date  Taking? Authorizing Provider  °acetaminophen (TYLENOL) 325 MG tablet Take 2 tablets (650 mg total) by mouth every 6 (six) hours as needed for mild pain (or Fever >/= 101). 03/04/19  Yes McClung, Jeffrey T, MD  °albuterol (VENTOLIN HFA) 108 (90 Base) MCG/ACT inhaler Inhale 1-2 puffs into the lungs every 4 (four) hours as needed for wheezing or shortness of breath. 03/04/19  Yes McClung, Jeffrey T, MD  °Ascorbic Acid (VITAMIN C) 1000 MG tablet Take 1,000 mg by mouth daily.   Yes [provider]  °benzonatate (TESSALON) 200 MG capsule Take 1 capsule (200 mg total) by mouth 3 (three) times daily as needed for cough. 03/04/19  Yes McClung, Jeffrey T, MD  °chlorpheniramine-HYDROcodone (TUSSIONEX) 10-8 MG/5ML SUER Take 5 mLs by mouth every 12 (twelve) hours as needed for cough. 03/04/19  Yes McClung, Jeffrey T, MD  °Cholecalciferol (VITAMIN D-3) 25 MCG (1000 UT) CAPS Take 2,000 Units by mouth daily.   Yes [provider]  °fexofenadine (ALLEGRA) 180 MG tablet Take 180 mg by mouth daily.   Yes [provider]  °fluticasone (FLONASE) 50 MCG/ACT nasal spray Place 1 spray into both nostrils daily.    Yes [provider]  °Multiple Vitamin (MULTIVITAMIN WITH MINERALS) TABS tablet Take 1 tablet by mouth daily.   Yes [provider]  °rosuvastatin (CRESTOR) 5 MG tablet Take 5 mg by mouth daily. 12/19/18  Yes [provider]  °XARELTO 20 MG TABS tablet Take 20 mg by mouth daily. with food 03/07/19  Yes Provider,   03/07/19  Yes [provider]     History reviewed. No pertinent family history.  Social History   Socioeconomic History   Marital status: Married    Spouse name: Not on file   Number of children: Not on file   Years of education: Not on file   Highest education level: Not on file  Occupational History   Not on file  Social Needs   Financial resource strain: Not on file   Food insecurity    Worry: Not on file    Inability: Not on file   Transportation needs     Medical: Not on file    Non-medical: Not on file  Tobacco Use   Smoking status: Never Smoker   Smokeless tobacco: Never Used  Substance and Sexual Activity   Alcohol use: Yes    Comment: occasional beer or wine    Drug use: No   Sexual activity: Not on file  Lifestyle   Physical activity    Days per week: Not on file    Minutes per session: Not on file   Stress: Not on file  Relationships   Social connections    Talks on phone: Not on file    Gets together: Not on file    Attends religious service: Not on file    Active member of club or organization: Not on file    Attends meetings of clubs or organizations: Not on file    Relationship status: Not on file  Other Topics Concern   Not on file  Social History Narrative   Not on file     Review of Systems: A 12 point ROS discussed and pertinent positives are indicated in the HPI above.  All other systems are negative.  Review of Systems  Constitutional: Negative for fatigue and fever.  HENT: Positive for nosebleeds.   Respiratory: Positive for shortness of breath. Negative for cough.   Cardiovascular: Negative for chest pain.  Gastrointestinal: Negative for abdominal pain.  Musculoskeletal: Negative for back pain.  Psychiatric/Behavioral: Negative for behavioral problems and confusion.    Vital Signs: BP (!) 116/58    Pulse (!) 109    Temp 97.7 F (36.5 C) (Oral)    Resp 17    Ht 5' 10" (1.778 m)    Wt 220 lb (99.8 kg)    SpO2 91%    BMI 31.57 kg/m   Physical Exam Vitals signs and nursing note reviewed.  Constitutional:      Appearance: Normal appearance.  HENT:     Mouth/Throat:     Mouth: Mucous membranes are moist.     Pharynx: Oropharynx is clear.  Neck:     Musculoskeletal: Normal range of motion and neck supple.  Cardiovascular:     Rate and Rhythm: Regular rhythm. Tachycardia present.     Heart sounds: No murmur. No friction rub. No gallop.   Pulmonary:     Effort: Pulmonary effort is normal.  No respiratory distress.     Breath sounds: Normal breath sounds.  Musculoskeletal: Normal range of motion.  Skin:    General: Skin is warm and dry.  Neurological:     General: No focal deficit present.     Mental Status: He is alert and oriented to person, place, and time. Mental status is at baseline.  Psychiatric:        Mood and Affect: Mood normal.        Behavior: Behavior normal.        Thought  Judgment: Judgment normal.  ° ° ° ° °MD Evaluation °Airway: WNL °Heart: WNL °Abdomen: WNL °Chest/ Lungs: WNL °ASA  Classification: 3 °Mallampati/Airway Score: One ° ° °Imaging: °Dg Chest Port 1 View ° °Result Date: 02/20/2019 °CLINICAL DATA:  Worsening shortness of breath, COVID-19 positive, history prostate cancer EXAM: PORTABLE CHEST 1 VIEW COMPARISON:  Portable exam 1046 hours compared to 02/17/2019 FINDINGS: Stable heart size mediastinal contours. Extensive BILATERAL hours space infiltrates progressive since previous study, least severe in LEFT upper lobe. No pleural effusion or pneumothorax. IMPRESSION: Progressive BILATERAL airspace infiltrates consistent with COVID-19. Electronically Signed   By: Mark  Boles M.D.   On: 02/20/2019 11:16  ° °Dg Chest Port 1 View ° °Result Date: 02/17/2019 °CLINICAL DATA:  Increased shortness of breath. Previous diagnosis of COVID-19. EXAM: PORTABLE CHEST 1 VIEW COMPARISON:  02/15/2019 and earlier studies. FINDINGS: There are hazy airspace opacities, most evident in the inferior right upper lobe adjacent to the minor fissure, and left lung base, noted to a lesser degree in the right lung base, increased when compared to the prior radiographs. No convincing pleural effusion.  No pneumothorax. IMPRESSION: 1. Worsened lung aeration with an increase in bilateral hazy airspace opacities consistent with multifocal pneumonia, pattern consistent with the diagnosis of COVID-19. Electronically Signed   By: David  Ormond M.D.   On: 02/17/2019  12:01  ° °Dg Chest Port 1 View ° °Result Date: 02/15/2019 °CLINICAL DATA:  Cough and shortness of breath, COVID-19 positive. EXAM: PORTABLE CHEST 1 VIEW COMPARISON:  February 13, 2019 FINDINGS: The heart size and mediastinal contours are within normal limits. Hazy patchy ground-glass opacity is identified in the lateral right mid lung and in bilateral lung bases. No pleural effusion or pulmonary edema is identified. The visualized skeletal structures are unremarkable. IMPRESSION: Hazy patchy ground-glass opacity is identified in the bilateral lung. These findings can correlate to patient's known COVID-19 infection. Electronically Signed   By: Wei-Chen  Lin M.D.   On: 02/15/2019 12:43  ° °Dg Chest Port 1 View ° °Result Date: 02/13/2019 °CLINICAL DATA:  Fever and cough EXAM: PORTABLE CHEST 1 VIEW COMPARISON:  June 17, 2014. FINDINGS: The heart size and mediastinal contours are within normal limits. Linear streaky opacities seen at the left lung base likely atelectasis. No large airspace consolidation or pleural effusion. The visualized skeletal structures are unremarkable. IMPRESSION: Linear atelectasis at the left base. Electronically Signed   By: Bindu  Avutu M.D.   On: 02/13/2019 20:27  ° °Vas Us Lower Extremity Venous (dvt) ° °Result Date: 03/09/2019 ° Lower Venous Study Indications: Calf cramping. Recent Covid.  Risk Factors: DVT 07/04/14, positive in left peroneal vein. Comparison Study: Prior study from 07/04/14 is available for comparison Performing Technologist: Candace Kanady RVS  Examination Guidelines: A complete evaluation includes B-mode imaging, spectral Doppler, color Doppler, and power Doppler as needed of all accessible portions of each vessel. Bilateral testing is considered an integral part of a complete examination. Limited examinations for reoccurring indications may be performed as noted.  +-----+---------------+---------+-----------+----------+--------------+   RIGHT Compressibility Phasicity Spontaneity Properties Thrombus Aging  +-----+---------------+---------+-----------+----------+--------------+  CFV   Full            Yes       Yes                                    +-----+---------------+---------+-----------+----------+--------------+   +---------+---------------+---------+-----------+----------+--------------+  LEFT      Compressibility Phasicity Spontaneity Properties   Thrombus Aging  +---------+---------------+---------+-----------+----------+--------------+  CFV       Full            Yes       Yes                                    +---------+---------------+---------+-----------+----------+--------------+  SFJ       Full                                                             +---------+---------------+---------+-----------+----------+--------------+  FV Prox   Full                                                             +---------+---------------+---------+-----------+----------+--------------+  FV Mid    Full                                                             +---------+---------------+---------+-----------+----------+--------------+  FV Distal Full                                                             +---------+---------------+---------+-----------+----------+--------------+  PFV       Full                                                             +---------+---------------+---------+-----------+----------+--------------+  POP       Full            Yes       Yes                                    +---------+---------------+---------+-----------+----------+--------------+  PTV       None                                             Acute           +---------+---------------+---------+-----------+----------+--------------+  PERO      None                                             Acute           +---------+---------------+---------+-----------+----------+--------------+       Summary: Right: No evidence of common femoral vein  obstruction. Left: Findings consistent with acute deep vein thrombosis involving the left posterior tibial veins, and left peroneal veins.  *See table(s) above for measurements and observations. Electronically signed by Monica Martinez MD on 03/09/2019 at 4:12:19 PM.    Final     Labs:  CBC: Recent Labs    03/13/19 0349 03/13/19 0538 03/13/19 1705 03/14/19 0203  WBC 14.2* 14.3* 11.8* 11.3*  HGB 11.5* 7.3* 8.4* 8.3*  HCT 35.5* 24.2* 25.7* 24.7*  PLT 240 183 108* 154    COAGS: No results for input(s): INR, APTT in the last 8760 hours.  BMP: Recent Labs    03/02/19 0227 03/03/19 0050 03/13/19 0018 03/14/19 0203  NA 136 135 136 139  K 4.5 4.5 5.2* 3.9  CL 98 98 105 110  CO2 26 26 19* 23  GLUCOSE 103* 102* 250* 112*  BUN 30* 27* 37* 22  CALCIUM 8.6* 8.8* 8.4* 7.9*  CREATININE 0.79 0.84 0.93 0.69  GFRNONAA >60 >60 >60 >60  GFRAA >60 >60 >60 >60    LIVER FUNCTION TESTS: Recent Labs    02/28/19 0635 03/01/19 0115 03/02/19 0227 03/03/19 0050  BILITOT 0.8 0.5 0.7 0.9  AST 36 35 33 29  ALT 77* 80* 82* 85*  ALKPHOS 41 39 40 47  PROT 5.9* 6.0* 5.8* 6.5  ALBUMIN 2.9* 3.0* 3.1* 3.3*    TUMOR MARKERS: No results for input(s): AFPTM, CEA, CA199, CHROMGRNA in the last 8760 hours.  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  Patient was initiated on Xarelto at home, however presented to [facility] ED with epistaxis.  He was found to be tachycardic and anemic and was admitted for further work-up.  Consideration now underway for possible IVC filter placement.   Case reviewed by Dr. Kathlene Cote who states that due to location of the DVT, anticoagulation would be preferred.  Patient has tolerated Xarelto in the past (with prior DVT), and was recently maintained on Lovenox as an inpatient without bleeding concerns.   Now, after several weeks of oxygen therapy, he is admitted with epistaxis.  Question whether trial of lovenox would be beneficial in this patient.  Discussed with Dr. Posey Pronto who will continue discussions with patient and his wife.  PA has met with patient and wife today.  All questions answered.  IR following.   Thank you for this interesting consult.  I greatly enjoyed meeting Ronald Arnold and look forward to participating in their care.  A copy of this report was sent to the requesting provider on this date.  Electronically Signed: Docia Barrier, PA 03/14/2019, 4:01 PM   I spent a total of 40 Minutes    in face to face in clinical consultation, greater than 50% of which was counseling/coordinating care for epistaxis, DVT.

## 2019-03-14 NOTE — Progress Notes (Signed)
While patient was transferring from W/C to bed upon arrival to 1424, pt c/o sudden "nagging" pain rated a 3/10 to right calf. Pt states this pain felt similar to how his left DVT felt. Pt also says there is a sore spot in the back of his calf. Right calf noticeably more swollen than left. Pt states pain stopped once he sat down. MD Posey Pronto paged to be made aware. See new orders.

## 2019-03-14 NOTE — Progress Notes (Signed)
[facility] Progress Note  Patient: Ronald Arnold [id number]   PCP: Lawerance Cruel, MD DOB: November 07, 1955   DOA: 03/12/2019   DOS: 03/14/2019   Date of Service: the patient was seen and examined on 03/14/2019  Brief hospital course: Pt. with PMH of prostate cancer status post prostatectomy, history of DVT in 2015, prediabetes, recent admission 8/1-8/16 for pneumonia secondary to COVID-19 viral infection,recent diagnosis of left lower extremity DVT on 8/20 started on rivaroxaban presenting to the hospital for evaluation of epistaxis ; admitted on 03/12/2019, Currently further plan is continue supportive care.  Subjective: Denies any nausea or vomiting.  No further bleeding today.  Last episode of bleeding was last night.  No diarrhea.  No blood in the stool or fever.  Reports right leg pain while walking.  Assessment and Plan: Epistaxis and acute blood loss anemia in setting of oral anticoagulant use for recent DVT Tachycardic, systolic as low as 0000000.  Blood pressure now improved with IV fluid boluses.  Initial hemoglobin checked in the ED 11.5, repeat down to 7.3.  Hemoglobin was 15.3 on 8/15.  Epistaxis was controlled with topical tranexamic acid.  Patient subsequently vomited a large amount of blood in the ED which was felt to be related to his epistaxis. ED provider discussed the case with Dr. Wilburn Cornelia from ENT, no additional recommendations at this time since epistaxis has been controlled.  ENT recommended reconsulting if patient has recurrence of epistaxis.  Starting the patient on oral iron. No further treatment for now  Recent acute DVT. 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 Prior DVT was also in the same location Patient presents patient's likelihood of having recurrent DVT and require lifelong anticoagulation. Patient had another episode of bleeding on 03/14/2019 already in the morning. At present plan is to monitor the patient  closely for any evidence of bleeding. Discussed with IR and they felt that IVC filter for distal DVT may not be adequate for now. If patient is willing we will attempt heparin infusion and monitor for bleeding.  Leukocytosis Possibly reactive.  Patient is afebrile.  White count 14.2.  Not endorsing any UTI or pneumonia symptoms. -Continue to monitor CBC  Prediabetes Hemoglobin A1c 6.2 on 8/4.  CBG 250. -Sliding scale insulin sensitive and CBG checks  Left lower extremity DVT diagnosed 8/20 in the setting of recent hospital admission for COVID-19 viral pneumonia -Hold rivaroxaban in the setting of epistaxis and acute blood loss anemia  Diet: Cardiac diet DVT Prophylaxis: SCD, pharmacological prophylaxis contraindicated due to Acute bleeding  Advance goals of care discussion: Full code  Family Communication: family was present at bedside, at the time of interview. The pt provided permission to discuss medical plan with the family. Opportunity was given to ask question and all questions were answered satisfactorily.   Disposition:  Discharge to Home once H&H is stable and no further bleeding.  Consultants: IR Procedures: PRBC transfusion  Scheduled Meds: . sodium chloride   Intravenous Once  . Chlorhexidine Gluconate Cloth  6 each Topical Daily  . insulin aspart  0-9 Units Subcutaneous TID WC  . mouth rinse  15 mL Mouth Rinse BID   Continuous Infusions: PRN Meds: acetaminophen **OR** acetaminophen, benzonatate, chlorpheniramine-HYDROcodone, lip balm, oxymetazoline Antibiotics: Anti-infectives (From admission, onward)   None       Objective: Physical Exam: Vitals:   03/14/19 1500 03/14/19 1504 03/14/19 1600 03/14/19 1640  BP: 115/61  117/77 131/86  Pulse: (!) 104 (!) 108  100 (!) 106  Resp: (!) 30 14 20 20   Temp:    99.1 F (37.3 C)  TempSrc:    Oral  SpO2:  94% 94% 95%  Weight:      Height:        Intake/Output Summary (Last 24 hours) at 03/14/2019 1751 Last  data filed at 03/14/2019 1618 Gross per 24 hour  Intake 601.25 ml  Output 3000 ml  Net -2398.75 ml   Filed Weights   03/12/19 2232  Weight: 99.8 kg   General: alert and oriented to time, place, and person. Appear in mild distress, affect appropriate Eyes: PERRL, Conjunctiva normal ENT: Oral Mucosa Clear, moist  Neck: NO JVD, NO Abnormal Mass Or lumps Cardiovascular: S1 and S2 Present, NO Murmur, peripheral pulses symmetrical Respiratory: normal respiratory effort, Bilateral Air entry equal and Decreased, no use of accessory muscle, Clear to Auscultation, no Crackles, no wheezes Abdomen: Bowel Sound present, Soft and no tenderness, no hernia Skin: no rashes  Extremities: no Pedal edema, no calf tenderness Neurologic: normal without focal findings, mental status, speech normal, alert and oriented x3, PERLA, Motor strength 5/5 and symmetric and sensation grossly normal to light touch Gait not checked due to patient safety concerns  Data Reviewed: CBC: Recent Labs  Lab 03/13/19 0349 03/13/19 0538 03/13/19 1705 03/14/19 0203  WBC 14.2* 14.3* 11.8* 11.3*  NEUTROABS 11.2* 10.8*  --   --   HGB 11.5* 7.3* 8.4* 8.3*  HCT 35.5* 24.2* 25.7* 24.7*  MCV 99.2 106.6* 95.5 93.6  PLT 240 183 108* [id number]   Basic Metabolic Panel: Recent Labs  Lab 03/13/19 0018 03/14/19 0203  NA 136 139  K 5.2* 3.9  CL 105 110  CO2 19* 23  GLUCOSE 250* 112*  BUN 37* 22  CREATININE 0.93 0.69  CALCIUM 8.4* 7.9*    Liver Function Tests: No results for input(s): AST, ALT, ALKPHOS, BILITOT, PROT, ALBUMIN in the last 168 hours. No results for input(s): LIPASE, AMYLASE in the last 168 hours. No results for input(s): AMMONIA in the last 168 hours. Coagulation Profile: No results for input(s): INR, PROTIME in the last 168 hours. Cardiac Enzymes: No results for input(s): CKTOTAL, CKMB, CKMBINDEX, TROPONINI in the last 168 hours. BNP (last 3 results) No results for input(s): PROBNP in the last 8760 hours.  CBG: Recent Labs  Lab 03/13/19 1205 03/13/19 1621 03/13/19 2116 03/14/19 0813 03/14/19 1645  GLUCAP 106* 109* 99 89 98   Studies: No results found.   Time spent: 35 minutes  Author: Berle Mull, MD [facility] 03/14/2019 5:51 PM  To reach On-call, see care teams to locate the attending and reach out to them via [email or web address]. If 7PM-7AM, please contact night-coverage If you still have difficulty reaching the attending provider, please page the [facility] (Director on Call) for [facility] on amion for assistance.

## 2019-03-14 NOTE — Progress Notes (Signed)
Patient to transfer to [facility] report given to receiving nurse Marolyn Hammock, all questions answered at this time.  Pt. VSS with no s/s of distress noted.  Pt. Stable at transfer.

## 2019-03-15 ENCOUNTER — Inpatient Hospital Stay (HOSPITAL_COMMUNITY): Payer: BC Managed Care – PPO

## 2019-03-15 DIAGNOSIS — M79609 Pain in unspecified limb: Secondary | ICD-10-CM

## 2019-03-15 LAB — CBC WITH DIFFERENTIAL/PLATELET
Abs Immature Granulocytes: 0.07 10*3/uL (ref 0.00–0.07)
Basophils Absolute: 0 10*3/uL (ref 0.0–0.1)
Basophils Relative: 0 %
Eosinophils Absolute: 0.5 10*3/uL (ref 0.0–0.5)
Eosinophils Relative: 6 %
HCT: 26 % — ABNORMAL LOW (ref 39.0–52.0)
Hemoglobin: 8.4 g/dL — ABNORMAL LOW (ref 13.0–17.0)
Immature Granulocytes: 1 %
Lymphocytes Relative: 32 %
Lymphs Abs: 2.9 10*3/uL (ref 0.7–4.0)
MCH: 31 pg (ref 26.0–34.0)
MCHC: 32.3 g/dL (ref 30.0–36.0)
MCV: 95.9 fL (ref 80.0–100.0)
Monocytes Absolute: 0.7 10*3/uL (ref 0.1–1.0)
Monocytes Relative: 8 %
Neutro Abs: 4.9 10*3/uL (ref 1.7–7.7)
Neutrophils Relative %: 53 %
Platelets: 196 10*3/uL (ref 150–400)
RBC: 2.71 MIL/uL — ABNORMAL LOW (ref 4.22–5.81)
RDW: 15.7 % — ABNORMAL HIGH (ref 11.5–15.5)
WBC: 9.1 10*3/uL (ref 4.0–10.5)
nRBC: 0 % (ref 0.0–0.2)

## 2019-03-15 LAB — COMPREHENSIVE METABOLIC PANEL
ALT: 32 U/L (ref 0–44)
AST: 19 U/L (ref 15–41)
Albumin: 2.6 g/dL — ABNORMAL LOW (ref 3.5–5.0)
Alkaline Phosphatase: 44 U/L (ref 38–126)
Anion gap: 8 (ref 5–15)
BUN: 12 mg/dL (ref 8–23)
CO2: 22 mmol/L (ref 22–32)
Calcium: 8.2 mg/dL — ABNORMAL LOW (ref 8.9–10.3)
Chloride: 107 mmol/L (ref 98–111)
Creatinine, Ser: 0.71 mg/dL (ref 0.61–1.24)
GFR calc Af Amer: >60 mL/min (ref >60)
GFR calc non Af Amer: >60 mL/min (ref >60)
Glucose, Bld: 89 mg/dL (ref 70–99)
Potassium: 4 mmol/L (ref 3.5–5.1)
Sodium: 137 mmol/L (ref 135–145)
Total Bilirubin: 0.4 mg/dL (ref 0.3–1.2)
Total Protein: 5.8 g/dL — ABNORMAL LOW (ref 6.5–8.1)

## 2019-03-15 LAB — GLUCOSE, CAPILLARY
Glucose-Capillary: 80 mg/dL (ref 70–99)
Glucose-Capillary: 101 mg/dL — ABNORMAL HIGH (ref 70–99)
Glucose-Capillary: 105 mg/dL — ABNORMAL HIGH (ref 70–99)
Glucose-Capillary: 108 mg/dL — ABNORMAL HIGH (ref 70–99)

## 2019-03-15 LAB — MAGNESIUM: Magnesium: 2.1 mg/dL (ref 1.7–2.4)

## 2019-03-15 NOTE — Progress Notes (Signed)
Referring Physician(s): Lavina Hamman  Supervising Physician: Aletta Edouard  Patient Status:  [facility] - In-pt  Chief Complaint: "Leg pain"  Subjective:  Patient with history of LLE DVT s/p anticoagulation with Xarelto, developed spontaneous epistaxis 03/12/2019 so Xarelto was stopped and switched to Lovenox, now with hemoptysis and RLE DVT today so Lovenox was stopped. IR consulted for possible IVCF placement. Patient awake and alert sitting in chair. Accompanied by wife at bedside. Complains of bilateral leg pain. States he can "feel where the clots are".   Allergies: Sulfa antibiotics  Medications: Prior to Admission medications   Medication Sig Start Date End Date Taking? Authorizing Provider  acetaminophen (TYLENOL) 325 MG tablet Take 2 tablets (650 mg total) by mouth every 6 (six) hours as needed for mild pain (or Fever >/= 101). 03/04/19  Yes Cherene Altes, MD  albuterol (VENTOLIN HFA) 108 (90 Base) MCG/ACT inhaler Inhale 1-2 puffs into the lungs every 4 (four) hours as needed for wheezing or shortness of breath. 03/04/19  Yes Cherene Altes, MD  Ascorbic Acid (VITAMIN C) 1000 MG tablet Take 1,000 mg by mouth daily.   Yes [provider]  benzonatate (TESSALON) 200 MG capsule Take 1 capsule (200 mg total) by mouth 3 (three) times daily as needed for cough. 03/04/19  Yes Cherene Altes, MD  chlorpheniramine-HYDROcodone (TUSSIONEX) 10-8 MG/5ML SUER Take 5 mLs by mouth every 12 (twelve) hours as needed for cough. 03/04/19  Yes Cherene Altes, MD  Cholecalciferol (VITAMIN D-3) 25 MCG (1000 UT) CAPS Take 2,000 Units by mouth daily.   Yes [provider]  fexofenadine (ALLEGRA) 180 MG tablet Take 180 mg by mouth daily.   Yes [provider]  fluticasone (FLONASE) 50 MCG/ACT nasal spray Place 1 spray into both nostrils daily.    Yes [provider]  Multiple Vitamin (MULTIVITAMIN WITH MINERALS) TABS tablet Take 1 tablet by mouth daily.    Yes [provider]  rosuvastatin (CRESTOR) 5 MG tablet Take 5 mg by mouth daily. 12/19/18  Yes [provider]  XARELTO 20 MG TABS tablet Take 20 mg by mouth daily. with food 03/07/19  Yes [provider]     Vital Signs: BP 110/63 (BP Location: Left Arm)    Pulse (!) 104    Temp 98.1 F (36.7 C) (Oral)    Resp 18    Ht 5\' 10"  (1.778 m)    Wt 220 lb (99.8 kg)    SpO2 92%    BMI 31.57 kg/m   Physical Exam Vitals signs and nursing note reviewed.  Constitutional:      General: He is not in acute distress.    Appearance: Normal appearance.  Cardiovascular:     Rate and Rhythm: Normal rate and regular rhythm.     Heart sounds: Normal heart sounds. No murmur.     Comments: Distal pulses intact bilaterally. Pulmonary:     Effort: Pulmonary effort is normal. No respiratory distress.     Breath sounds: Normal breath sounds. No wheezing.  Skin:    General: Skin is warm and dry.  Neurological:     Mental Status: He is alert and oriented to person, place, and time.  Psychiatric:        Mood and Affect: Mood normal.        Behavior: Behavior normal.        Thought Content: Thought content normal.        Judgment: Judgment normal.  Imaging: Dg Chest 2 View  Result Date: 03/15/2019 CLINICAL DATA:  Shortness of breath, COVID-19 pneumonia EXAM: CHEST - 2 VIEW COMPARISON:  02/20/2019 FINDINGS: The heart size and mediastinal contours are within normal limits. Increasingly consolidated appearance of bilateral heterogeneous airspace opacity, most conspicuous in the perihilar mid lungs bilaterally. There are probable small pleural effusions. The visualized skeletal structures are unremarkable. IMPRESSION: Increasingly consolidated appearance of bilateral heterogeneous airspace opacity, most conspicuous in the perihilar mid lungs bilaterally. There are probable small pleural effusions. Findings are generally consistent with subacute evolution of COVID-19 airspace disease  in comparison to prior radiographs. CT may be helpful to further evaluate. Electronically Signed   By: Eddie Candle M.D.   On: 03/15/2019 14:34   Vas [place] Lower Extremity Venous (dvt)  Result Date: 03/15/2019  Lower Venous Study Indications: Pain, Swelling, and Recent Covid, positive for DVT in left calf 03/08/19.  Comparison Study: Prior positve LLE venous study done 03/08/19 Performing Technologist: Sharion Dove RVS  Examination Guidelines: A complete evaluation includes B-mode imaging, spectral Doppler, color Doppler, and power Doppler as needed of all accessible portions of each vessel. Bilateral testing is considered an integral part of a complete examination. Limited examinations for reoccurring indications may be performed as noted.  +---------+---------------+---------+-----------+----------+--------------+  RIGHT     Compressibility Phasicity Spontaneity Properties Thrombus Aging  +---------+---------------+---------+-----------+----------+--------------+  CFV       Full            Yes       Yes                                    +---------+---------------+---------+-----------+----------+--------------+  SFJ       Full                                                             +---------+---------------+---------+-----------+----------+--------------+  FV Prox   Full                                                             +---------+---------------+---------+-----------+----------+--------------+  FV Mid    Full                                                             +---------+---------------+---------+-----------+----------+--------------+  FV Distal Full                                                             +---------+---------------+---------+-----------+----------+--------------+  PFV       Full                                                             +---------+---------------+---------+-----------+----------+--------------+  POP       Full            Yes       Yes                                     +---------+---------------+---------+-----------+----------+--------------+  PTV       None            No        No                     Acute           +---------+---------------+---------+-----------+----------+--------------+  PERO      Full                                                             +---------+---------------+---------+-----------+----------+--------------+     Summary: Right: Findings consistent with acute deep vein thrombosis involving the right posterior tibial veins.  *See table(s) above for measurements and observations.    Preliminary     Labs:  CBC: Recent Labs    03/13/19 0538 03/13/19 1705 03/14/19 0203 03/15/19 0336  WBC 14.3* 11.8* 11.3* 9.1  HGB 7.3* 8.4* 8.3* 8.4*  HCT 24.2* 25.7* 24.7* 26.0*  PLT 183 108* 154 196    COAGS: No results for input(s): INR, APTT in the last 8760 hours.  BMP: Recent Labs    03/03/19 0050 03/13/19 0018 03/14/19 0203 03/15/19 0336  NA 135 136 139 137  K 4.5 5.2* 3.9 4.0  CL 98 105 110 107  CO2 26 19* 23 22  GLUCOSE 102* 250* 112* 89  BUN 27* 37* 22 12  CALCIUM 8.8* 8.4* 7.9* 8.2*  CREATININE 0.84 0.93 0.69 0.71  GFRNONAA >60 >60 >60 >60  GFRAA >60 >60 >60 >60    LIVER FUNCTION TESTS: Recent Labs    03/01/19 0115 03/02/19 0227 03/03/19 0050 03/15/19 0336  BILITOT 0.5 0.7 0.9 0.4  AST 35 33 29 19  ALT 80* 82* 85* 32  ALKPHOS 39 40 47 44  PROT 6.0* 5.8* 6.5 5.8*  ALBUMIN 3.0* 3.1* 3.3* 2.6*    Assessment and Plan:  Bilateral DVTs, epistaxis, hemoptysis. Given new symptoms/bilateral DVT, case was re-reviewed by Dr. Kathlene Cote who approves procedure. Plan for image-guided IVCF placement tentatively for tomorrow 03/16/2019 in IR with Dr. Laurence Ferrari. Patient will be NPO at midnight. Afebrile and WBCs WNL. He does not take blood thinners. INR pending for 0500 tomorrow.  Risks and benefits discussed with the patient including, but not limited to bleeding, infection, contrast induced  renal failure, filter fracture or migration which can lead to emergency surgery or even death, strut penetration with damage or irritation to adjacent structures and caval thrombosis. All of the patient's questions were answered, patient is agreeable to proceed. Consent signed and in chart.   Electronically Signed: Earley Abide, PA-C 03/15/2019, 3:43 PM   I spent a total of 35 Minutes at the the patient's bedside AND on the patient's hospital floor or unit, greater than 50% of which was counseling/coordinating care for bilateral DVT/IVCF placement.

## 2019-03-15 NOTE — Progress Notes (Signed)
[facility] Progress Note  Patient: Ronald Arnold [id number]   PCP: Lawerance Cruel, MD DOB: [birth year]   DOA: 03/12/2019   DOS: 03/15/2019   Date of Service: the patient was seen and examined on 03/15/2019  Brief hospital course: Pt. with PMH of prostate cancer status post prostatectomy, history of DVT in 2015, prediabetes, recent admission 8/1-8/16 for pneumonia secondary to COVID-19 viral infection,recent diagnosis of left lower extremity DVT on 8/20 started on rivaroxaban presenting to the hospital for evaluation of epistaxis ; admitted on 03/12/2019, Currently further plan is continue supportive care.  Subjective: Reports some hemoptysis this morning.  No chest pain abdominal pain.  No dizziness no lightheadedness.  No active bleeding.  No epistaxis.  Assessment and Plan: Epistaxis and acute blood loss anemia in setting of oral anticoagulant use for recent DVT Tachycardic, systolic as low as 0000000.  Blood pressure now improved with IV fluid boluses.  Initial hemoglobin checked in the ED 11.5, repeat down to 7.3.  Hemoglobin was 15.3 on 8/15.  Epistaxis was controlled with topical tranexamic acid.  Patient subsequently vomited a large amount of blood in the ED which was felt to be related to his epistaxis. ED provider discussed the case with Dr. Wilburn Cornelia from ENT, no additional recommendations at this time since epistaxis has been controlled.  ENT recommended reconsulting if patient has recurrence of epistaxis.  Starting the patient on oral iron. No further treatment for now  Recent acute DVT. 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 Prior DVT was also in the same location Patient presents patient's likelihood of having recurrent DVT and require lifelong anticoagulation. Patient had another episode of bleeding on 03/14/2019 already in the morning. At present plan is to go for IVC filter placement due to significant risk for bleeding.   Leukocytosis Possibly reactive.  Patient is afebrile.  White count 14.2.  Not endorsing any UTI or pneumonia symptoms. -Continue to monitor CBC  Prediabetes Hemoglobin A1c 6.2 on 8/4.  CBG 250. -Sliding scale insulin sensitive and CBG checks  Left lower extremity DVT diagnosed 8/20 in the setting of recent hospital admission for COVID-19 viral pneumonia -Hold rivaroxaban in the setting of epistaxis and acute blood loss anemia  Diet: Cardiac diet DVT Prophylaxis: SCD, pharmacological prophylaxis contraindicated due to Acute bleeding  Advance goals of care discussion: Full code  Family Communication: family was present at bedside, at the time of interview. The pt provided permission to discuss medical plan with the family. Opportunity was given to ask question and all questions were answered satisfactorily.   Disposition:  Discharge to Home once H&H is stable and no further bleeding.  Consultants: IR Procedures: PRBC transfusion  Scheduled Meds: . sodium chloride   Intravenous Once  . Chlorhexidine Gluconate Cloth  6 each Topical Daily  . ferrous [id number] C-folic acid  1 capsule Oral TID PC  . insulin aspart  0-9 Units Subcutaneous TID WC  . mouth rinse  15 mL Mouth Rinse BID   Continuous Infusions: PRN Meds: acetaminophen **OR** acetaminophen, benzonatate, chlorpheniramine-HYDROcodone, lip balm, oxymetazoline Antibiotics: Anti-infectives (From admission, onward)   None       Objective: Physical Exam: Vitals:   03/14/19 1640 03/14/19 2102 03/15/19 0447 03/15/19 1439  BP: 131/86 110/68 124/75 110/63  Pulse: (!) 106 (!) 103 97 (!) 104  Resp: 20 18 20 18   Temp: 99.1 F (37.3 C) 98.4 F (36.9 C) 97.6 F (36.4 C) 98.1 F (36.7 C)  TempSrc: Oral  Oral  Oral  SpO2: 95% 92% 95% 92%  Weight:      Height:        Intake/Output Summary (Last 24 hours) at 03/15/2019 2019 Last data filed at 03/15/2019 1900 Gross per 24 hour  Intake 360 ml  Output 2100 ml  Net  -1740 ml   Filed Weights   03/12/19 2232  Weight: 99.8 kg   General: alert and oriented to time, place, and person. Appear in mild distress, affect appropriate Eyes: PERRL, Conjunctiva normal ENT: Oral Mucosa Clear, moist  Neck: NO JVD, NO Abnormal Mass Or lumps Cardiovascular: S1 and S2 Present, NO Murmur, peripheral pulses symmetrical Respiratory: normal respiratory effort, Bilateral Air entry equal and Decreased, no use of accessory muscle, Clear to Auscultation, no Crackles, no wheezes Abdomen: Bowel Sound present, Soft and no tenderness, no hernia Skin: no rashes  Extremities: no Pedal edema, no calf tenderness Neurologic: normal without focal findings, mental status, speech normal, alert and oriented x3, PERLA, Motor strength 5/5 and symmetric and sensation grossly normal to light touch Gait not checked due to patient safety concerns  Data Reviewed: CBC: Recent Labs  Lab 03/13/19 0349 03/13/19 0538 03/13/19 1705 03/14/19 0203 03/15/19 0336  WBC 14.2* 14.3* 11.8* 11.3* 9.1  NEUTROABS 11.2* 10.8*  --   --  4.9  HGB 11.5* 7.3* 8.4* 8.3* 8.4*  HCT 35.5* 24.2* 25.7* 24.7* 26.0*  MCV 99.2 106.6* 95.5 93.6 95.9  PLT 240 183 108* 154 [id number]   Basic Metabolic Panel: Recent Labs  Lab 03/13/19 0018 03/14/19 0203 03/15/19 0336  NA 136 139 137  K 5.2* 3.9 4.0  CL 105 110 107  CO2 19* 23 22  GLUCOSE 250* 112* 89  BUN 37* 22 12  CREATININE 0.93 0.69 0.71  CALCIUM 8.4* 7.9* 8.2*  MG  --   --  2.1    Liver Function Tests: Recent Labs  Lab 03/15/19 0336  AST 19  ALT 32  ALKPHOS 44  BILITOT 0.4  PROT 5.8*  ALBUMIN 2.6*   No results for input(s): LIPASE, AMYLASE in the last 168 hours. No results for input(s): AMMONIA in the last 168 hours. Coagulation Profile: No results for input(s): INR, PROTIME in the last 168 hours. Cardiac Enzymes: No results for input(s): CKTOTAL, CKMB, CKMBINDEX, TROPONINI in the last 168 hours. BNP (last 3 results) No results for input(s):  PROBNP in the last 8760 hours. CBG: Recent Labs  Lab 03/14/19 1157 03/14/19 1645 03/14/19 2059 03/15/19 0747 03/15/19 1637  GLUCAP 149* 98 167* 80 105*   Studies: Dg Chest 2 View  Result Date: 03/15/2019 CLINICAL DATA:  Shortness of breath, COVID-19 pneumonia EXAM: CHEST - 2 VIEW COMPARISON:  02/20/2019 FINDINGS: The heart size and mediastinal contours are within normal limits. Increasingly consolidated appearance of bilateral heterogeneous airspace opacity, most conspicuous in the perihilar mid lungs bilaterally. There are probable small pleural effusions. The visualized skeletal structures are unremarkable. IMPRESSION: Increasingly consolidated appearance of bilateral heterogeneous airspace opacity, most conspicuous in the perihilar mid lungs bilaterally. There are probable small pleural effusions. Findings are generally consistent with subacute evolution of COVID-19 airspace disease in comparison to prior radiographs. CT may be helpful to further evaluate. Electronically Signed   By: Eddie Candle M.D.   On: 03/15/2019 14:34   Vas [place] Lower Extremity Venous (dvt)  Result Date: 03/15/2019  Lower Venous Study Indications: Pain, Swelling, and Recent Covid, positive for DVT in left calf 03/08/19.  Comparison Study: Prior positve LLE venous study done 03/08/19  Performing Technologist: Sharion Dove RVS  Examination Guidelines: A complete evaluation includes B-mode imaging, spectral Doppler, color Doppler, and power Doppler as needed of all accessible portions of each vessel. Bilateral testing is considered an integral part of a complete examination. Limited examinations for reoccurring indications may be performed as noted.  +---------+---------------+---------+-----------+----------+--------------+ RIGHT    CompressibilityPhasicitySpontaneityPropertiesThrombus Aging +---------+---------------+---------+-----------+----------+--------------+ CFV      Full           Yes      Yes                                  +---------+---------------+---------+-----------+----------+--------------+ SFJ      Full                                                        +---------+---------------+---------+-----------+----------+--------------+ FV Prox  Full                                                        +---------+---------------+---------+-----------+----------+--------------+ FV Mid   Full                                                        +---------+---------------+---------+-----------+----------+--------------+ FV DistalFull                                                        +---------+---------------+---------+-----------+----------+--------------+ PFV      Full                                                        +---------+---------------+---------+-----------+----------+--------------+ POP      Full           Yes      Yes                                 +---------+---------------+---------+-----------+----------+--------------+ PTV      None           No       No                   Acute          +---------+---------------+---------+-----------+----------+--------------+ PERO     Full                                                        +---------+---------------+---------+-----------+----------+--------------+  Summary: Right: Findings consistent with acute deep vein thrombosis involving the right posterior tibial veins.  *See table(s) above for measurements and observations. Electronically signed by Monica Martinez MD on 03/15/2019 at 5:01:39 PM.    Final      Time spent: 35 minutes  Author: Berle Mull, MD [facility] 03/15/2019 8:19 PM  To reach On-call, see care teams to locate the attending and reach out to them via [email or web address]. If 7PM-7AM, please contact night-coverage If you still have difficulty reaching the attending provider, please page the [facility] (Director on Call) for [facility] on amion for  assistance.

## 2019-03-15 NOTE — Progress Notes (Addendum)
VASCULAR LAB PRELIMINARY  PRELIMINARY  PRELIMINARY  PRELIMINARY  Right lower extremity venous duplex completed.    Preliminary report:  See CV proc for preliminary results.  Gave report to Dr. Posey Pronto. Gave report to [place], RN  Mauro Kaufmann, [place], RVT 03/15/2019, 11:13 AM

## 2019-03-16 ENCOUNTER — Inpatient Hospital Stay (HOSPITAL_COMMUNITY): Payer: BC Managed Care – PPO

## 2019-03-16 ENCOUNTER — Encounter (HOSPITAL_COMMUNITY): Payer: Self-pay | Admitting: Interventional Radiology

## 2019-03-16 HISTORY — PX: IR IVC FILTER PLMT / S&I /IMG GUID/MOD SED: IMG701

## 2019-03-16 LAB — CBC
HCT: 26.9 % — ABNORMAL LOW (ref 39.0–52.0)
Hemoglobin: 8.4 g/dL — ABNORMAL LOW (ref 13.0–17.0)
MCH: 30.7 pg (ref 26.0–34.0)
MCHC: 31.2 g/dL (ref 30.0–36.0)
MCV: 98.2 fL (ref 80.0–100.0)
Platelets: 219 10*3/uL (ref 150–400)
RBC: 2.74 MIL/uL — ABNORMAL LOW (ref 4.22–5.81)
RDW: 15.9 % — ABNORMAL HIGH (ref 11.5–15.5)
WBC: 7.9 10*3/uL (ref 4.0–10.5)
nRBC: 0 % (ref 0.0–0.2)

## 2019-03-16 LAB — BASIC METABOLIC PANEL
Anion gap: 7 (ref 5–15)
BUN: 12 mg/dL (ref 8–23)
CO2: 26 mmol/L (ref 22–32)
Calcium: 8.4 mg/dL — ABNORMAL LOW (ref 8.9–10.3)
Chloride: 108 mmol/L (ref 98–111)
Creatinine, Ser: 0.67 mg/dL (ref 0.61–1.24)
GFR calc Af Amer: >60 mL/min (ref >60)
GFR calc non Af Amer: >60 mL/min (ref >60)
Glucose, Bld: 96 mg/dL (ref 70–99)
Potassium: 4.3 mmol/L (ref 3.5–5.1)
Sodium: 141 mmol/L (ref 135–145)

## 2019-03-16 LAB — PROTIME-INR
INR: 1.1 (ref 0.8–1.2)
Prothrombin Time: 13.6 seconds (ref 11.4–15.2)

## 2019-03-16 LAB — GLUCOSE, CAPILLARY
Glucose-Capillary: 81 mg/dL (ref 70–99)
Glucose-Capillary: 79 mg/dL (ref 70–99)
Glucose-Capillary: 86 mg/dL (ref 70–99)
Glucose-Capillary: 133 mg/dL — ABNORMAL HIGH (ref 70–99)

## 2019-03-16 MED ORDER — LIDOCAINE HCL 1 % IJ SOLN
INTRAMUSCULAR | Status: AC
  Filled 2019-03-16: qty 20

## 2019-03-16 MED ORDER — FENTANYL CITRATE (PF) 100 MCG/2ML IJ SOLN
INTRAMUSCULAR | Status: AC | PRN
  Administered 2019-03-16 (×2): 50 ug via INTRAVENOUS

## 2019-03-16 MED ORDER — IOHEXOL 300 MG/ML  SOLN
50.0000 mL | Freq: Once | INTRAMUSCULAR | Status: AC | PRN
  Administered 2019-03-16: 17:00:00 10 mL via INTRAVENOUS

## 2019-03-16 MED ORDER — IOHEXOL 300 MG/ML  SOLN
100.0000 mL | Freq: Once | INTRAMUSCULAR | Status: AC | PRN
  Administered 2019-03-16: 17:00:00 45 mL via INTRAVENOUS

## 2019-03-16 MED ORDER — LIDOCAINE HCL (PF) 1 % IJ SOLN
INTRAMUSCULAR | Status: AC | PRN
  Administered 2019-03-16: 5 mL

## 2019-03-16 MED ORDER — MIDAZOLAM HCL 2 MG/2ML IJ SOLN
INTRAMUSCULAR | Status: AC
  Filled 2019-03-16: qty 2

## 2019-03-16 MED ORDER — FENTANYL CITRATE (PF) 100 MCG/2ML IJ SOLN
INTRAMUSCULAR | Status: AC
  Filled 2019-03-16: qty 2

## 2019-03-16 MED ORDER — MIDAZOLAM HCL 2 MG/2ML IJ SOLN
INTRAMUSCULAR | Status: AC | PRN
  Administered 2019-03-16 (×2): 1 mg via INTRAVENOUS

## 2019-03-16 NOTE — Procedures (Signed)
Interventional Radiology Procedure Note  Procedure: Placement of IVC filter  Complications: None  Estimated Blood Loss: None  Recommendations: - Bedrest with HOB elevated x 2 hrs   Signed,  Kaye Luoma K. Dolce Sylvia, MD    

## 2019-03-16 NOTE — Progress Notes (Signed)
[facility] Progress Note  Patient: Ronald Arnold [id number]   PCP: Lawerance Cruel, MD DOB: [birth year]   DOA: 03/12/2019   DOS: 03/16/2019   Date of Service: the patient was seen and examined on 03/16/2019  Brief hospital course: Pt. with PMH of prostate cancer status post prostatectomy, history of DVT in 2015, prediabetes, recent admission 8/1-8/16 for pneumonia secondary to COVID-19 viral infection,recent diagnosis of left lower extremity DVT on 8/20 started on rivaroxaban presenting to the hospital for evaluation of epistaxis ; admitted on 03/12/2019, Currently further plan is continue supportive care.  Subjective:  No chest pain abdominal pain.  No dizziness no lightheadedness.  No active bleeding.  No epistaxis. No hemoptysis  Assessment and Plan: Epistaxis and acute blood loss anemia in setting of oral anticoagulant use for recent DVT Tachycardic, systolic as low as 0000000.  Blood pressure now improved with IV fluid boluses.  Initial hemoglobin checked in the ED 11.5, repeat down to 7.3.  Hemoglobin was 15.3 on 8/15.  Epistaxis was controlled with topical tranexamic acid.  Patient subsequently vomited a large amount of blood in the ED which was felt to be related to his epistaxis. ED provider discussed the case with Dr. Wilburn Cornelia from ENT, no additional recommendations at this time since epistaxis has been controlled.  ENT recommended reconsulting if patient has recurrence of epistaxis.  Starting the patient on oral iron. No further treatment for now  Recent acute DVT. 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 Prior DVT was also in the same location Patient presents patient's likelihood of having recurrent DVT and require lifelong anticoagulation. Patient had another episode of bleeding on 03/14/2019 already in the morning. At present plan is to go for IVC filter placement due to significant risk for bleeding.  Leukocytosis Possibly  reactive.  Patient is afebrile.  White count 14.2.  Not endorsing any UTI or pneumonia symptoms. -Continue to monitor CBC  Prediabetes Hemoglobin A1c 6.2 on 8/4.  CBG 250. -Sliding scale insulin sensitive and CBG checks  Left lower extremity DVT diagnosed 8/20 in the setting of recent hospital admission for COVID-19 viral pneumonia -Hold rivaroxaban in the setting of epistaxis and acute blood loss anemia  Diet: Cardiac diet DVT Prophylaxis: SCD, pharmacological prophylaxis contraindicated due to Acute bleeding  Advance goals of care discussion: Full code  Family Communication: family was present at bedside, at the time of interview. The pt provided permission to discuss medical plan with the family. Opportunity was given to ask question and all questions were answered satisfactorily.   Disposition:  Discharge to Home once H&H is stable and no further bleeding.  Consultants: IR Procedures: PRBC transfusion  Scheduled Meds:  sodium chloride   Intravenous Once   Chlorhexidine Gluconate Cloth  6 each Topical Daily   fentaNYL       ferrous [id number] C-folic acid  1 capsule Oral TID PC   insulin aspart  0-9 Units Subcutaneous TID WC   lidocaine       mouth rinse  15 mL Mouth Rinse BID   midazolam       Continuous Infusions: PRN Meds: acetaminophen **OR** acetaminophen, benzonatate, chlorpheniramine-HYDROcodone, lip balm, oxymetazoline Antibiotics: Anti-infectives (From admission, onward)   None       Objective: Physical Exam: Vitals:   03/16/19 1711 03/16/19 1715 03/16/19 1720 03/16/19 1723  BP: (!) 150/89 (!) 143/89 (!) 142/95 (!) 142/95  Pulse: 93 92 93 94  Resp: (!) 26 17 (!) 23 18  Temp:      TempSrc:      SpO2: 98% 97% 96% 96%  Weight:      Height:        Intake/Output Summary (Last 24 hours) at 03/16/2019 1810 Last data filed at 03/16/2019 1613 Gross per 24 hour  Intake --  Output 2800 ml  Net -2800 ml   Filed Weights   03/12/19 2232   Weight: 99.8 kg   General: alert and oriented to time, place, and person. Appear in mild distress, affect appropriate Eyes: PERRL, Conjunctiva normal ENT: Oral Mucosa Clear, moist  Neck: NO JVD, NO Abnormal Mass Or lumps Cardiovascular: S1 and S2 Present, NO Murmur, peripheral pulses symmetrical Respiratory: normal respiratory effort, Bilateral Air entry equal and Decreased, no use of accessory muscle, Clear to Auscultation, no Crackles, no wheezes Abdomen: Bowel Sound present, Soft and no tenderness, no hernia Skin: no rashes  Extremities: no Pedal edema, no calf tenderness Neurologic: normal without focal findings, mental status, speech normal, alert and oriented x3, PERLA, Motor strength 5/5 and symmetric and sensation grossly normal to light touch Gait not checked due to patient safety concerns  Data Reviewed: CBC: Recent Labs  Lab 03/13/19 0349 03/13/19 0538 03/13/19 1705 03/14/19 0203 03/15/19 0336 03/16/19 0753  WBC 14.2* 14.3* 11.8* 11.3* 9.1 7.9  NEUTROABS 11.2* 10.8*  --   --  4.9  --   HGB 11.5* 7.3* 8.4* 8.3* 8.4* 8.4*  HCT 35.5* 24.2* 25.7* 24.7* 26.0* 26.9*  MCV 99.2 106.6* 95.5 93.6 95.9 98.2  PLT 240 183 108* 154 196 [id number]   Basic Metabolic Panel: Recent Labs  Lab 03/13/19 0018 03/14/19 0203 03/15/19 0336 03/16/19 0753  NA 136 139 137 141  K 5.2* 3.9 4.0 4.3  CL 105 110 107 108  CO2 19* 23 22 26   GLUCOSE 250* 112* 89 96  BUN 37* 22 12 12   CREATININE 0.93 0.69 0.71 0.67  CALCIUM 8.4* 7.9* 8.2* 8.4*  MG  --   --  2.1  --     Liver Function Tests: Recent Labs  Lab 03/15/19 0336  AST 19  ALT 32  ALKPHOS 44  BILITOT 0.4  PROT 5.8*  ALBUMIN 2.6*   No results for input(s): LIPASE, AMYLASE in the last 168 hours. No results for input(s): AMMONIA in the last 168 hours. Coagulation Profile: Recent Labs  Lab 03/16/19 0351  INR 1.1   Cardiac Enzymes: No results for input(s): CKTOTAL, CKMB, CKMBINDEX, TROPONINI in the last 168 hours. BNP (last 3  results) No results for input(s): PROBNP in the last 8760 hours. CBG: Recent Labs  Lab 03/15/19 1637 03/15/19 2124 03/16/19 0758 03/16/19 1152 03/16/19 1609  GLUCAP 105* 108* 81 79 86   Studies: Ir Ivc Filter Plmt / S&i /img Guid/mod Sed  Result Date: 03/16/2019 INDICATION: 63 year old male with bilateral calf DVT and an inability to tolerate anticoagulation secondary to significant nose bleeds and decreasing hemoglobin. He presents for placement of a potentially retrievable IVC filter. Recent history of COVID-19 and pneumonia. EXAM: ULTRASOUND GUIDANCE FOR VASCULARACCESS IVC CATHETERIZATION AND VENOGRAM IVC FILTER INSERTION Interventional Radiologist:  Criselda Peaches, MD MEDICATIONS: None. ANESTHESIA/SEDATION: Fentanyl 2 mcg IV; Versed 100 mg IV Moderate Sedation Time:  10 minutes The patient was continuously monitored during the procedure by the interventional radiology nurse under my direct supervision. FLUOROSCOPY TIME:  Fluoroscopy Time: 0 minutes 24 seconds (61 mGy). COMPLICATIONS: None immediate. PROCEDURE: Informed written consent was obtained from the patient after a thorough discussion of the  procedural risks, benefits and alternatives. All questions were addressed. Maximal Sterile Barrier Technique was utilized including caps, mask, sterile gowns, sterile gloves, sterile drape, hand hygiene and skin antiseptic. A timeout was performed prior to the initiation of the procedure. Maximal barrier sterile technique utilized including caps, mask, sterile gowns, sterile gloves, large sterile drape, hand hygiene, and Betadine prep. Under sterile condition and local anesthesia, right internal jugular venous access was performed with ultrasound. An ultrasound image was saved and sent to PACS. Over a guidewire, the IVC filter delivery sheath and inner dilator were advanced into the IVC just above the IVC bifurcation. Contrast injection was performed for an IVC venogram. Through the delivery  sheath, a retrievable Denali IVC filter was deployed below the level of the renal veins and above the IVC bifurcation. Limited post deployment venacavagram was performed. The delivery sheath was removed and hemostasis was obtained with manual compression. A dressing was placed. The patient tolerated the procedure well without immediate post procedural complication. FINDINGS: The IVC is patent. No evidence of thrombus, stenosis, or occlusion. No variant venous anatomy. Successful placement of the IVC filter below the level of the renal veins. IMPRESSION: Successful ultrasound and fluoroscopically guided placement of an infrarenal retrievable IVC filter via right jugular approach. PLAN: This IVC filter is potentially retrievable. The patient will be assessed for filter retrieval by Interventional Radiology in approximately 8-12 weeks. Further recommendations regarding filter retrieval, continued surveillance or declaration of device permanence, will be made at that time. Electronically Signed   By: Jacqulynn Cadet M.D.   On: 03/16/2019 17:46     Time spent: 35 minutes  Author: Berle Mull, MD [facility] 03/16/2019 6:10 PM  To reach On-call, see care teams to locate the attending and reach out to them via [email or web address]. If 7PM-7AM, please contact night-coverage If you still have difficulty reaching the attending provider, please page the [facility] (Director on Call) for [facility] on amion for assistance.

## 2019-03-17 LAB — CBC
HCT: 26.4 % — ABNORMAL LOW (ref 39.0–52.0)
Hemoglobin: 8.5 g/dL — ABNORMAL LOW (ref 13.0–17.0)
MCH: 31.1 pg (ref 26.0–34.0)
MCHC: 32.2 g/dL (ref 30.0–36.0)
MCV: 96.7 fL (ref 80.0–100.0)
Platelets: 278 10*3/uL (ref 150–400)
RBC: 2.73 MIL/uL — ABNORMAL LOW (ref 4.22–5.81)
RDW: 16.3 % — ABNORMAL HIGH (ref 11.5–15.5)
WBC: 6.8 10*3/uL (ref 4.0–10.5)
nRBC: 0 % (ref 0.0–0.2)

## 2019-03-17 LAB — GLUCOSE, CAPILLARY
Glucose-Capillary: 93 mg/dL (ref 70–99)
Glucose-Capillary: 129 mg/dL — ABNORMAL HIGH (ref 70–99)

## 2019-03-17 LAB — BASIC METABOLIC PANEL
Anion gap: 5 (ref 5–15)
BUN: 12 mg/dL (ref 8–23)
CO2: 25 mmol/L (ref 22–32)
Calcium: 8.3 mg/dL — ABNORMAL LOW (ref 8.9–10.3)
Chloride: 109 mmol/L (ref 98–111)
Creatinine, Ser: 0.68 mg/dL (ref 0.61–1.24)
GFR calc Af Amer: >60 mL/min (ref >60)
GFR calc non Af Amer: >60 mL/min (ref >60)
Glucose, Bld: 91 mg/dL (ref 70–99)
Potassium: 3.9 mmol/L (ref 3.5–5.1)
Sodium: 139 mmol/L (ref 135–145)

## 2019-03-17 MED ORDER — OXYMETAZOLINE HCL 0.05 % NA SOLN: 1.0000 | Freq: Two times a day (BID) | NASAL | 0 refills | Status: DC | PRN

## 2019-03-17 MED ORDER — FE FUMARATE-B12-VIT C-FA-IFC PO CAPS: 1.0000 | ORAL_CAPSULE | Freq: Three times a day (TID) | ORAL | 0 refills | Status: AC

## 2019-03-17 NOTE — Progress Notes (Signed)
Patient discharged home.  IV removed - WNL.  Reviewed AVS and medications, instructed to follow up with [facility] and PCP.  Instructed to make MD aware of any bleeding.  Verbalizes understanding.  Medical release sheet signed and sent to medical records for PCP office.  No questions at this time.  Patient assisted off floor via WC in NAD

## 2019-03-17 NOTE — Discharge Instructions (Signed)
Anemia  Anemia is a condition in which you do not have enough red blood cells or hemoglobin. Hemoglobin is a substance in red blood cells that carries oxygen. When you do not have enough red blood cells or hemoglobin (are anemic), your body cannot get enough oxygen and your organs may not work properly. As a result, you may feel very tired or have other problems. What are the causes? Common causes of anemia include:  Excessive bleeding. Anemia can be caused by excessive bleeding inside or outside the body, including bleeding from the intestine or from periods in women.  Poor nutrition.  Long-lasting (chronic) kidney, thyroid, and liver disease.  Bone marrow disorders.  Cancer and treatments for cancer.  HIV (human immunodeficiency virus) and AIDS (acquired immunodeficiency syndrome).  Treatments for HIV and AIDS.  Spleen problems.  Blood disorders.  Infections, medicines, and autoimmune disorders that destroy red blood cells. What are the signs or symptoms? Symptoms of this condition include:  Minor weakness.  Dizziness.  Headache.  Feeling heartbeats that are irregular or faster than normal (palpitations).  Shortness of breath, especially with exercise.  Paleness.  Cold sensitivity.  Indigestion.  Nausea.  Difficulty sleeping.  Difficulty concentrating. Symptoms may occur suddenly or develop slowly. If your anemia is mild, you may not have symptoms. How is this diagnosed? This condition is diagnosed based on:  Blood tests.  Your medical history.  A physical exam.  Bone marrow biopsy. Your health care provider may also check your stool (feces) for blood and may do additional testing to look for the cause of your bleeding. You may also have other tests, including:  Imaging tests, such as a CT scan or MRI.  Endoscopy.  Colonoscopy. How is this treated? Treatment for this condition depends on the cause. If you continue to lose a lot of blood, you may  need to be treated at a hospital. Treatment may include:  Taking supplements of iron, vitamin M08, or folic acid.  Taking a hormone medicine (erythropoietin) that can help to stimulate red blood cell growth.  Having a blood transfusion. This may be needed if you lose a lot of blood.  Making changes to your diet.  Having surgery to remove your spleen. Follow these instructions at home:  Take over-the-counter and prescription medicines only as told by your health care provider.  Take supplements only as told by your health care provider.  Follow any diet instructions that you were given.  Keep all follow-up visits as told by your health care provider. This is important. Contact a health care provider if:  You develop new bleeding anywhere in the body. Get help right away if:  You are very weak.  You are short of breath.  You have pain in your abdomen or chest.  You are dizzy or feel faint.  You have trouble concentrating.  You have bloody or black, tarry stools.  You vomit repeatedly or you vomit up blood. Summary  Anemia is a condition in which you do not have enough red blood cells or enough of a substance in your red blood cells that carries oxygen (hemoglobin).  Symptoms may occur suddenly or develop slowly.  If your anemia is mild, you may not have symptoms.  This condition is diagnosed with blood tests as well as a medical history and physical exam. Other tests may be needed.  Treatment for this condition depends on the cause of the anemia. This information is not intended to replace advice given to you by  your health care provider. Make sure you discuss any questions you have with your health care provider. Document Released: 08/12/2004 Document Revised: 06/17/2017 Document Reviewed: 08/06/2016 Elsevier Patient Education  2020 [employer].

## 2019-03-19 ENCOUNTER — Telehealth: Payer: Self-pay | Admitting: Hematology and Oncology

## 2019-03-19 ENCOUNTER — Other Ambulatory Visit: Payer: Self-pay

## 2019-03-19 NOTE — Telephone Encounter (Signed)
Received a msg from Dr. Lindi Adie to schedule a hem appt for Ronald Arnold for dvt. I cld and was unable to leave the pt a vm on his cell phone but was able to leave a msg on his home number.

## 2019-03-22 NOTE — Discharge Summary (Signed)
[facility] Discharge Summary   Patient: Ronald Arnold [id number]   PCP: Lawerance Cruel, MD DOB: 1956-07-18   Date of admission: 03/12/2019   Date of discharge: 03/17/2019     Discharge Diagnoses:  Principal Problem:   Epistaxis Active Problems:   Prediabetes   Leukocytosis   DVT (deep venous thrombosis) ([place])   Admitted From: Home Disposition:  Home   Recommendations for Outpatient Follow-up:  1. Please follow-up with PCP in 1 week. 2. Patient was referred to hematology as well.  Follow-up Information    Lawerance Cruel, MD. Schedule an appointment as soon as possible for a visit in 1 week(s).   Specialty: Family Medicine Why: Repeat CBC in 1 week. Contact information: [address] [place] [id number] [facility] Follow up.   Specialty: Oncology Why: You will receive a call from the office regarding the follow-up for discussion of DVT. Contact information: [place] [id number] [place] [phone]         Diet recommendation: Cardiac diet  Activity: The patient is advised to gradually reintroduce usual activities,as tolerated .  Discharge Condition: good  Code Status: Full code  History of present illness: As per the H and P dictated on admission, "Ronald Arnold is a 63 y.o. male with medical history significant of prostate cancer status post prostatectomy, history of DVT in 2015, prediabetes, recent admission 8/1-8/16 for pneumonia secondary to COVID-19 viral infection, recent diagnosis of left lower extremity DVT on 8/20 started on rivaroxaban presenting to the hospital for evaluation of epistaxis.  Patient states he has had 4 episodes of large-volume nosebleeds from his left nostril in the past 3 days.  Denies any injury to his nose.  States he stopped taking aspirin since he was recently diagnosed with DVT and started on rivaroxaban.  Denies  any abdominal pain, chest pain, shortness of breath, cough, dizziness/lightheadedness, or episodes of syncope.  No other complaints."  Hospital Course:  Summary of his active problems in the hospital is as following. Epistaxisand acute blood loss anemiain setting of oral anticoagulant use for recent DVT  Tachycardic, systolic as low as 0000000. Blood pressure improved with IV fluid boluses.Initial hemoglobin checked in the ED 11.5, repeat down to 7.3. Hemoglobin was 15.3 on 8/15.  Epistaxis was controlled with topical tranexamic acid. Patient subsequently vomiteda large amount ofblood in the ED which was felt to be related to his epistaxis.   ED provider discussed the case with Dr. Wilburn Cornelia from ENT,no additional recommendations at this time since epistaxishas been controlled. ENT recommended reconsulting if patient has recurrence of epistaxis.  Starting the patient on oral iron. No further treatment for now, PRN Afrin spray at home.  Recent acute DVT. 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 Prior DVT was also in the same location Patient presents patient's likelihood of having recurrent DVT and require lifelong anticoagulation. Patient had another episode of bleeding on 03/14/2019 already in the morning. Patient does have significant risk for clotting given his history of prostate cancer.  Patient also has bilateral DVT. Currently in the hospital with severe epistaxis with hemoglobin dropped from 15-7 requiring blood transfusion. Currently patient remains unsafe to reinitiate anticoagulation. Patient will require protection from large PE and therefore IVC filter is indicated. IR consulted and patient tolerated the procedure very well. At sometime down the road patient will require reinitiation of  the anticoagulation and if successfully initiated patient will require removal of the IVC filter. This was conveyed to the patient. Hematology  consult was arranged outpatient.  Leukocytosis Possibly reactive. Patient is afebrile. White count 14.2. Not endorsing any UTI or pneumonia symptoms.  Prediabetes Hemoglobin A1c 6.2 on 8/4. CBG 250.  Patient was ambulatory without any assistance. On the day of the discharge the patient's vitals were stable, and no other acute medical condition were reported by patient. the patient was felt safe to be discharge at Home with no therapy needed on discharge.  Consultants: Interventional radiology Procedures: IVC filter placement  DISCHARGE MEDICATION: Allergies as of 03/17/2019      Reactions   Sulfa Antibiotics Other (See Comments)   As a child patient is unaware of reaction      Medication List    STOP taking these medications   Xarelto 20 MG Tabs tablet Generic drug: rivaroxaban     TAKE these medications   acetaminophen 325 MG tablet Commonly known as: TYLENOL Take 2 tablets (650 mg total) by mouth every 6 (six) hours as needed for mild pain (or Fever >/= 101).   albuterol 108 (90 Base) MCG/ACT inhaler Commonly known as: VENTOLIN HFA Inhale 1-2 puffs into the lungs every 4 (four) hours as needed for wheezing or shortness of breath.   benzonatate 200 MG capsule Commonly known as: TESSALON Take 1 capsule (200 mg total) by mouth 3 (three) times daily as needed for cough.   chlorpheniramine-HYDROcodone 10-8 MG/5ML Suer Commonly known as: TUSSIONEX Take 5 mLs by mouth every 12 (twelve) hours as needed for cough.   ferrous [id number] C-folic acid capsule Commonly known as: TRINSICON / FOLTRIN Take 1 capsule by mouth 3 (three) times daily after meals.   fexofenadine 180 MG tablet Commonly known as: ALLEGRA Take 180 mg by mouth daily.   fluticasone 50 MCG/ACT nasal spray Commonly known as: FLONASE Place 1 spray into both nostrils daily.   multivitamin with minerals Tabs tablet Take 1 tablet by mouth daily.   oxymetazoline 0.05 % nasal spray Commonly  known as: AFRIN Place 1 spray into both nostrils 2 (two) times daily as needed for congestion.   rosuvastatin 5 MG tablet Commonly known as: CRESTOR Take 5 mg by mouth daily.   vitamin C 1000 MG tablet Take 1,000 mg by mouth daily.   Vitamin D-3 25 MCG (1000 UT) Caps Take 2,000 Units by mouth daily.      Allergies  Allergen Reactions   Sulfa Antibiotics Other (See Comments)    As a child patient is unaware of reaction   Discharge Instructions    Diet - low sodium heart healthy   Complete by: As directed    Discharge instructions   Complete by: As directed    It is important that you read the given instructions as well as go over your medication list with RN to help you understand your care after this hospitalization.  Discharge Instructions: Please follow-up with PCP in 1-2 weeks  Please request your primary care physician to go over all Hospital Tests and Procedure/Radiological results at the follow up. Please get all Hospital records sent to your PCP by signing hospital release before you go home.   Do not take more than prescribed Pain, Sleep and Anxiety Medications. You were cared for by a hospitalist during your hospital stay. If you have any questions about your discharge medications or the care you received while you were in the hospital after you are discharged,  you can call the unit @UNIT @ you were admitted to and ask to speak with the hospitalist on call if the hospitalist that took care of you is not available.  Once you are discharged, your primary care physician will handle any further medical issues. Please note that NO REFILLS for any discharge medications will be authorized once you are discharged, as it is imperative that you return to your primary care physician (or establish a relationship with a primary care physician if you do not have one) for your aftercare needs so that they can reassess your need for medications and monitor your lab values. You Must read  complete instructions/literature along with all the possible adverse reactions/side effects for all the Medicines you take and that have been prescribed to you. Take any new Medicines after you have completely understood and accept all the possible adverse reactions/side effects. Wear Seat belts while driving. If you have smoked or chewed Tobacco in the last 2 yrs please stop smoking and/or stop any Recreational drug use.  If you drink alcohol, please STOP the use and do not drive, operating heavy machinery, perform activities at heights, swimming or participation in water activities or provide baby sitting services under influence.   Increase activity slowly   Complete by: As directed      Discharge Exam: Filed Weights   03/12/19 2232  Weight: 99.8 kg   Vitals:   03/16/19 2008 03/17/19 0509  BP: 107/62 109/71  Pulse: (!) 101 86  Resp: 20 18  Temp: 97.8 F (36.6 C) 98.4 F (36.9 C)  SpO2: 93% 97%   General: Appear in no distress, no Rash; Oral Mucosa Clear, moist. no Abnormal Mass Or lumps Cardiovascular: S1 and S2 Present, no Murmur, Respiratory: normal respiratory effort, Bilateral Air entry present and bilateral  Crackles, no wheezes Abdomen: Bowel Sound present, Soft and no tenderness, no hernia Extremities: no Pedal edema, no calf tenderness Neurology: alert and oriented to time, place, and person affect appropriate. normal without focal findings, mental status, speech normal, alert and oriented x3, PERLA, Motor strength 5/5 and symmetric and sensation grossly normal to light touch   The results of significant diagnostics from this hospitalization (including imaging, microbiology, ancillary and laboratory) are listed below for reference.    Significant Diagnostic Studies: Dg Chest 2 View  Result Date: 03/15/2019 CLINICAL DATA:  Shortness of breath, COVID-19 pneumonia EXAM: CHEST - 2 VIEW COMPARISON:  02/20/2019 FINDINGS: The heart size and mediastinal contours are within  normal limits. Increasingly consolidated appearance of bilateral heterogeneous airspace opacity, most conspicuous in the perihilar mid lungs bilaterally. There are probable small pleural effusions. The visualized skeletal structures are unremarkable. IMPRESSION: Increasingly consolidated appearance of bilateral heterogeneous airspace opacity, most conspicuous in the perihilar mid lungs bilaterally. There are probable small pleural effusions. Findings are generally consistent with subacute evolution of COVID-19 airspace disease in comparison to prior radiographs. CT may be helpful to further evaluate. Electronically Signed   By: Eddie Candle M.D.   On: 03/15/2019 14:34   Ir Ivc Filter Plmt / S&i /img Guid/mod Sed  Result Date: 03/16/2019 INDICATION: 63 year old male with bilateral calf DVT and an inability to tolerate anticoagulation secondary to significant nose bleeds and decreasing hemoglobin. He presents for placement of a potentially retrievable IVC filter. Recent history of COVID-19 and pneumonia. EXAM: ULTRASOUND GUIDANCE FOR VASCULARACCESS IVC CATHETERIZATION AND VENOGRAM IVC FILTER INSERTION Interventional Radiologist:  Criselda Peaches, MD MEDICATIONS: None. ANESTHESIA/SEDATION: Fentanyl 2 mcg IV; Versed 100 mg IV Moderate Sedation  Time:  10 minutes The patient was continuously monitored during the procedure by the interventional radiology nurse under my direct supervision. FLUOROSCOPY TIME:  Fluoroscopy Time: 0 minutes 24 seconds (61 mGy). COMPLICATIONS: None immediate. PROCEDURE: Informed written consent was obtained from the patient after a thorough discussion of the procedural risks, benefits and alternatives. All questions were addressed. Maximal Sterile Barrier Technique was utilized including caps, mask, sterile gowns, sterile gloves, sterile drape, hand hygiene and skin antiseptic. A timeout was performed prior to the initiation of the procedure. Maximal barrier sterile technique utilized  including caps, mask, sterile gowns, sterile gloves, large sterile drape, hand hygiene, and Betadine prep. Under sterile condition and local anesthesia, right internal jugular venous access was performed with ultrasound. An ultrasound image was saved and sent to PACS. Over a guidewire, the IVC filter delivery sheath and inner dilator were advanced into the IVC just above the IVC bifurcation. Contrast injection was performed for an IVC venogram. Through the delivery sheath, a retrievable Denali IVC filter was deployed below the level of the renal veins and above the IVC bifurcation. Limited post deployment venacavagram was performed. The delivery sheath was removed and hemostasis was obtained with manual compression. A dressing was placed. The patient tolerated the procedure well without immediate post procedural complication. FINDINGS: The IVC is patent. No evidence of thrombus, stenosis, or occlusion. No variant venous anatomy. Successful placement of the IVC filter below the level of the renal veins. IMPRESSION: Successful ultrasound and fluoroscopically guided placement of an infrarenal retrievable IVC filter via right jugular approach. PLAN: This IVC filter is potentially retrievable. The patient will be assessed for filter retrieval by Interventional Radiology in approximately 8-12 weeks. Further recommendations regarding filter retrieval, continued surveillance or declaration of device permanence, will be made at that time. Electronically Signed   By: Jacqulynn Cadet M.D.   On: 03/16/2019 17:46   Vas [place] Lower Extremity Venous (dvt)  Result Date: 03/15/2019  Lower Venous Study Indications: Pain, Swelling, and Recent Covid, positive for DVT in left calf 03/08/19.  Comparison Study: Prior positve LLE venous study done 03/08/19 Performing Technologist: Sharion Dove RVS  Examination Guidelines: A complete evaluation includes B-mode imaging, spectral Doppler, color Doppler, and power Doppler as needed of all  accessible portions of each vessel. Bilateral testing is considered an integral part of a complete examination. Limited examinations for reoccurring indications may be performed as noted.  +---------+---------------+---------+-----------+----------+--------------+  RIGHT     Compressibility Phasicity Spontaneity Properties Thrombus Aging  +---------+---------------+---------+-----------+----------+--------------+  CFV       Full            Yes       Yes                                    +---------+---------------+---------+-----------+----------+--------------+  SFJ       Full                                                             +---------+---------------+---------+-----------+----------+--------------+  FV Prox   Full                                                             +---------+---------------+---------+-----------+----------+--------------+  FV Mid    Full                                                             +---------+---------------+---------+-----------+----------+--------------+  FV Distal Full                                                             +---------+---------------+---------+-----------+----------+--------------+  PFV       Full                                                             +---------+---------------+---------+-----------+----------+--------------+  POP       Full            Yes       Yes                                    +---------+---------------+---------+-----------+----------+--------------+  PTV       None            No        No                     Acute           +---------+---------------+---------+-----------+----------+--------------+  PERO      Full                                                             +---------+---------------+---------+-----------+----------+--------------+     Summary: Right: Findings consistent with acute deep vein thrombosis involving the right posterior tibial veins.  *See table(s) above for measurements and  observations. Electronically signed by Monica Martinez MD on 03/15/2019 at 5:01:39 PM.    Final    Vas [place] Lower Extremity Venous (dvt)  Result Date: 03/09/2019  Lower Venous Study Indications: Calf cramping. Recent Covid.  Risk Factors: DVT 07/04/14, positive in left peroneal vein. Comparison Study: Prior study from 07/04/14 is available for comparison Performing Technologist: Sharion Dove RVS  Examination Guidelines: A complete evaluation includes B-mode imaging, spectral Doppler, color Doppler, and power Doppler as needed of all accessible portions of each vessel. Bilateral testing is considered an integral part of a complete examination. Limited examinations for reoccurring indications may be performed as noted.  +-----+---------------+---------+-----------+----------+--------------+  RIGHT Compressibility Phasicity Spontaneity Properties Thrombus Aging  +-----+---------------+---------+-----------+----------+--------------+  CFV   Full            Yes       Yes                                    +-----+---------------+---------+-----------+----------+--------------+   +---------+---------------+---------+-----------+----------+--------------+  LEFT      Compressibility Phasicity Spontaneity Properties Thrombus Aging  +---------+---------------+---------+-----------+----------+--------------+  CFV       Full            Yes       Yes                                    +---------+---------------+---------+-----------+----------+--------------+  SFJ       Full                                                             +---------+---------------+---------+-----------+----------+--------------+  FV Prox   Full                                                             +---------+---------------+---------+-----------+----------+--------------+  FV Mid    Full                                                             +---------+---------------+---------+-----------+----------+--------------+  FV Distal Full                                                              +---------+---------------+---------+-----------+----------+--------------+  PFV       Full                                                             +---------+---------------+---------+-----------+----------+--------------+  POP       Full            Yes       Yes                                    +---------+---------------+---------+-----------+----------+--------------+  PTV       None                                             Acute           +---------+---------------+---------+-----------+----------+--------------+  PERO      None  Acute           +---------+---------------+---------+-----------+----------+--------------+     Summary: Right: No evidence of common femoral vein obstruction. Left: Findings consistent with acute deep vein thrombosis involving the left posterior tibial veins, and left peroneal veins.  *See table(s) above for measurements and observations. Electronically signed by Monica Martinez MD on 03/09/2019 at 4:12:19 PM.    Final     Microbiology: Recent Results (from the past 240 hour(s))  SARS CORONAVIRUS 2 (TAT 6-12 HRS) Nasal Swab Aptima Multi Swab     Status: None   Collection Time: 03/13/19  5:28 AM   Specimen: Aptima Multi Swab; Nasal Swab  Result Value Ref Range Status   SARS Coronavirus 2 NEGATIVE NEGATIVE Final    Comment: (NOTE) SARS-CoV-2 target nucleic acids are NOT DETECTED. The SARS-CoV-2 RNA is generally detectable in upper and lower respiratory specimens during the acute phase of infection. Negative results do not preclude SARS-CoV-2 infection, do not rule out co-infections with other pathogens, and should not be used as the sole basis for treatment or other patient management decisions. Negative results must be combined with clinical observations, patient history, and epidemiological information. The expected result is Negative. Fact Sheet for  Patients: SugarRoll.be Fact Sheet for Healthcare Providers: https://www.woods-mathews.com/ This test is not yet approved or cleared by the [place] FDA and  has been authorized for detection and/or diagnosis of SARS-CoV-2 by FDA under an Emergency Use Authorization (EUA). This EUA will remain  in effect (meaning this test can be used) for the duration of the COVID-19 declaration under Section 56 4(b)(1) of the Act, 21 U.S.C. section 360bbb-3(b)(1), unless the authorization is terminated or revoked sooner. Performed at [facility], [place] [address], [place], [place] [address]      Labs: CBC: Recent Labs  Lab 03/16/19 0753 03/17/19 0405  WBC 7.9 6.8  HGB 8.4* 8.5*  HCT 26.9* 26.4*  MCV 98.2 96.7  PLT 219 0000000   Basic Metabolic Panel: Recent Labs  Lab 03/16/19 0753 03/17/19 0405  NA 141 139  K 4.3 3.9  CL 108 109  CO2 26 25  GLUCOSE 96 91  BUN 12 12  CREATININE 0.67 0.68  CALCIUM 8.4* 8.3*   Liver Function Tests: No results for input(s): AST, ALT, ALKPHOS, BILITOT, PROT, ALBUMIN in the last 168 hours. No results for input(s): LIPASE, AMYLASE in the last 168 hours. No results for input(s): AMMONIA in the last 168 hours. Cardiac Enzymes: No results for input(s): CKTOTAL, CKMB, CKMBINDEX, TROPONINI in the last 168 hours. BNP (last 3 results) No results for input(s): BNP in the last 8760 hours. CBG: Recent Labs  Lab 03/16/19 1152 03/16/19 1609 03/16/19 2120 03/17/19 0807 03/17/19 1142  GLUCAP 79 86 133* 93 129*   Time spent: 35 minutes  Signed:  Berle Mull  [facility] 03/17/2019

## 2019-04-07 NOTE — Progress Notes (Signed)
HEMATOLOGY/ONCOLOGY CONSULTATION NOTE  Date of Service: 04/07/2019  Patient Care Team: Lawerance Cruel, MD as PCP - General (Family Medicine)  CHIEF COMPLAINTS/PURPOSE OF CONSULTATION:  DVT  HISTORY OF PRESENTING ILLNESS:   Ronald Arnold is a wonderful 63 y.o. male who has been referred to [place] by Dr Lindi Adie for evaluation and management of DVT. We are joined today by his wife Jan, via phone. The pt reports that he is doing well overall.  In 2015 the pt had a Prostatectomy and was not placed on blood thinners directly after. About 2 weeks later he had a DVT in his left calf. He was then put on Xarelto 20 mg once a day for about 3 months and had no issues with that dose. On 02/17/2019 pt presented to the hospital for pneumonia secondary to COVID-19 viral infection. Pt was placed on Dexamethasone for his COVID19 infection but was not on asprin. On 03/08/2019 it was discovered that pt had another left lower extremity DVT. He was once again placed on Xarelto. A few days later he woke up and saw that he had a bleed all over his pillow due to a severe nosebleed. He continued to experience episodes of epistaxis over the next couple of days and was able to control them with pressure. Around dinner time on 03/12/2019 he had an episode that he was unable to stop so he went to the ER. They gave him an Afrin soaked cloth but did no chemical or surgical cauterization. Pt then began vomiting swallowed blood. The last time that he can remember having a nosebleed before was in 07/2018. He reports that his nosebleeds typically occur about once a year. Pt has no history of bleeding in other areas. The pt reports that he has not been seen by an ENT physician and no one has ever checked his nose to discover the cause of his nosebleeds. A right lower extremity DVT was discovered on 03/15/2019. On 03/16/2019 he was given an IVC Filter. They stopped his blood thinners before the procedure but restarted him after. He  was on blood thinners for less than a week and is currently off of them. He reports a knot in his left arm. He also notices that his last two digits of his left hand have been numb as well as a loss of sensation on the underside of his left arm since this procedure.   Pt has no other known medical problems. He has no family history of blood clots or other bleeding disorders. Today was his last day of his Prednisone course.  Pt is currently taking iron, folic acid, Vitamin C, Vitamin B12. Pt's stools are currently black due to the iron. He has been walking about a mile a day lately.    Of note prior to the patient's visit today, pt has had VAS [place] Lower Extremity Venous Left ([id number]) completed on 03/08/2019 with results revealing "Right: No evidence of common femoral vein obstruction. Left: Findings consistent with acute deep vein thrombosis involving the left posterior tibial veins, and left peroneal veins."   Pt also had VAS [place] Lower Extremity Venous Right ([id number]) completed on 03/15/2019 with results revealing "Right: Findings consistent with acute deep vein thrombosis involving the right posterior tibial veins."  Most recent lab results (03/17/2019) of CBC is as follows: all values are WNL except for RBC at 2.73, Hgb at 8.5, HCT at 26.4, RDW at 16.3, Calcium at 8.3.  On review of systems, pt reports finger numbness  in his left hand, left arm numbness/knot and denies bloody stools, abdominal pain, leg swelling, calf pain and any other symptoms.   On PMHx the pt reports DVT (both legs), COVID19 infection, Prostate Cancer, Prostatectomy (2015).  MEDICAL HISTORY:  Past Medical History:  Diagnosis Date   Cancer [facility])    prostate cancer    COVID-19 virus infection     SURGICAL HISTORY: Past Surgical History:  Procedure Laterality Date   IR IVC FILTER PLMT / S&I /IMG GUID/MOD SED  03/16/2019   LYMPHADENECTOMY Bilateral 06/20/2014   Procedure: LYMPHADENECTOMY;  Surgeon: Raynelle Bring,  MD;  Location: [facility];  Service: Urology;  Laterality: Bilateral;   ROBOT ASSISTED LAPAROSCOPIC RADICAL PROSTATECTOMY N/A 06/20/2014   Procedure: ROBOTIC ASSISTED LAPAROSCOPIC RADICAL PROSTATECTOMY LEVEL 2;  Surgeon: Raynelle Bring, MD;  Location: [facility];  Service: Urology;  Laterality: N/A;   TONSILLECTOMY     to correct sleep apnea     SOCIAL HISTORY: Social History   Socioeconomic History   Marital status: Married    Spouse name: Not on file   Number of children: Not on file   Years of education: Not on file   Highest education level: Not on file  Occupational History   Not on file  Social Needs   Financial resource strain: Not on file   Food insecurity    Worry: Not on file    Inability: Not on file   Transportation needs    Medical: Not on file    Non-medical: Not on file  Tobacco Use   Smoking status: Never Smoker   Smokeless tobacco: Never Used  Substance and Sexual Activity   Alcohol use: Yes    Comment: occasional beer or wine    Drug use: No   Sexual activity: Not on file  Lifestyle   Physical activity    Days per week: Not on file    Minutes per session: Not on file   Stress: Not on file  Relationships   Social connections    Talks on phone: Not on file    Gets together: Not on file    Attends religious service: Not on file    Active member of club or organization: Not on file    Attends meetings of clubs or organizations: Not on file    Relationship status: Not on file   Intimate partner violence    Fear of current or ex partner: Not on file    Emotionally abused: Not on file    Physically abused: Not on file    Forced sexual activity: Not on file  Other Topics Concern   Not on file  Social History Narrative   Not on file    FAMILY HISTORY: No family history on file.  ALLERGIES:  is allergic to sulfa antibiotics.  MEDICATIONS:  Current Outpatient Medications  Medication Sig Dispense Refill   acetaminophen (TYLENOL) 325  MG tablet Take 2 tablets (650 mg total) by mouth every 6 (six) hours as needed for mild pain (or Fever >/= 101).     albuterol (VENTOLIN HFA) 108 (90 Base) MCG/ACT inhaler Inhale 1-2 puffs into the lungs every 4 (four) hours as needed for wheezing or shortness of breath. 6.7 g 0   Ascorbic Acid (VITAMIN C) 1000 MG tablet Take 1,000 mg by mouth daily.     benzonatate (TESSALON) 200 MG capsule Take 1 capsule (200 mg total) by mouth 3 (three) times daily as needed for cough. 20 capsule 0   chlorpheniramine-HYDROcodone (TUSSIONEX)  10-8 MG/5ML SUER Take 5 mLs by mouth every 12 (twelve) hours as needed for cough. 140 mL 0   Cholecalciferol (VITAMIN D-3) 25 MCG (1000 UT) CAPS Take 2,000 Units by mouth daily.     ferrous [id number] C-folic acid (TRINSICON / FOLTRIN) capsule Take 1 capsule by mouth 3 (three) times daily after meals. 90 capsule 0   fexofenadine (ALLEGRA) 180 MG tablet Take 180 mg by mouth daily.     fluticasone (FLONASE) 50 MCG/ACT nasal spray Place 1 spray into both nostrils daily.      Multiple Vitamin (MULTIVITAMIN WITH MINERALS) TABS tablet Take 1 tablet by mouth daily.     oxymetazoline (AFRIN) 0.05 % nasal spray Place 1 spray into both nostrils 2 (two) times daily as needed for congestion. 30 mL 0   rosuvastatin (CRESTOR) 5 MG tablet Take 5 mg by mouth daily.     No current facility-administered medications for this visit.     REVIEW OF SYSTEMS:    10 Point review of Systems was done is negative except as noted above.  PHYSICAL EXAMINATION: ECOG PERFORMANCE STATUS: 1 - Symptomatic but completely ambulatory  . Vitals:   04/11/19 1032  BP: 139/87  Pulse: 88  Resp: 18  Temp: 97.8 F (36.6 C)  SpO2: 99%   Filed Weights   04/11/19 1032  Weight: 210 lb 1.6 oz (95.3 kg)   .Body mass index is 30.15 kg/m.  GENERAL:alert, in no acute distress and comfortable SKIN: no acute rashes, no significant lesions EYES: conjunctiva are pink and non-injected,  sclera anicteric OROPHARYNX: MMM, no exudates, no oropharyngeal erythema or ulceration NECK: supple, no JVD LYMPH:  no palpable lymphadenopathy in the cervical, axillary or inguinal regions LUNGS: clear to auscultation b/l with normal respiratory effort HEART: regular rate & rhythm ABDOMEN:  normoactive bowel sounds , non tender, not distended. Extremity: no pedal edema PSYCH: alert & oriented x 3 with fluent speech NEURO: no focal motor/sensory deficits  LABORATORY DATA:  I have reviewed the data as listed  . CBC Latest Ref Rng & Units 03/17/2019 03/16/2019 03/15/2019  WBC 4.0 - 10.5 K/uL 6.8 7.9 9.1  Hemoglobin 13.0 - 17.0 g/dL 8.5(L) 8.4(L) 8.4(L)  Hematocrit 39.0 - 52.0 % 26.4(L) 26.9(L) 26.0(L)  Platelets 150 - 400 K/uL 278 219 196    . CMP Latest Ref Rng & Units 03/17/2019 03/16/2019 03/15/2019  Glucose 70 - 99 mg/dL 91 96 89  BUN 8 - 23 mg/dL 12 12 12   Creatinine 0.61 - 1.24 mg/dL 0.68 0.67 0.71  Sodium 135 - 145 mmol/L 139 141 137  Potassium 3.5 - 5.1 mmol/L 3.9 4.3 4.0  Chloride 98 - 111 mmol/L 109 108 107  CO2 22 - 32 mmol/L 25 26 22   Calcium 8.9 - 10.3 mg/dL 8.3(L) 8.4(L) 8.2(L)  Total Protein 6.5 - 8.1 g/dL - - 5.8(L)  Total Bilirubin 0.3 - 1.2 mg/dL - - 0.4  Alkaline Phos 38 - 126 U/L - - 44  AST 15 - 41 U/L - - 19  ALT 0 - 44 U/L - - 32     RADIOGRAPHIC STUDIES: I have personally reviewed the radiological images as listed and agreed with the findings in the report. Dg Chest 2 View  Result Date: 03/15/2019 CLINICAL DATA:  Shortness of breath, COVID-19 pneumonia EXAM: CHEST - 2 VIEW COMPARISON:  02/20/2019 FINDINGS: The heart size and mediastinal contours are within normal limits. Increasingly consolidated appearance of bilateral heterogeneous airspace opacity, most conspicuous in the perihilar mid lungs bilaterally.  There are probable small pleural effusions. The visualized skeletal structures are unremarkable. IMPRESSION: Increasingly consolidated appearance of  bilateral heterogeneous airspace opacity, most conspicuous in the perihilar mid lungs bilaterally. There are probable small pleural effusions. Findings are generally consistent with subacute evolution of COVID-19 airspace disease in comparison to prior radiographs. CT may be helpful to further evaluate. Electronically Signed   By: Eddie Candle M.D.   On: 03/15/2019 14:34   Ir Ivc Filter Plmt / S&i /img Guid/mod Sed  Result Date: 03/16/2019 INDICATION: 63 year old male with bilateral calf DVT and an inability to tolerate anticoagulation secondary to significant nose bleeds and decreasing hemoglobin. He presents for placement of a potentially retrievable IVC filter. Recent history of COVID-19 and pneumonia. EXAM: ULTRASOUND GUIDANCE FOR VASCULARACCESS IVC CATHETERIZATION AND VENOGRAM IVC FILTER INSERTION Interventional Radiologist:  Criselda Peaches, MD MEDICATIONS: None. ANESTHESIA/SEDATION: Fentanyl 2 mcg IV; Versed 100 mg IV Moderate Sedation Time:  10 minutes The patient was continuously monitored during the procedure by the interventional radiology nurse under my direct supervision. FLUOROSCOPY TIME:  Fluoroscopy Time: 0 minutes 24 seconds (61 mGy). COMPLICATIONS: None immediate. PROCEDURE: Informed written consent was obtained from the patient after a thorough discussion of the procedural risks, benefits and alternatives. All questions were addressed. Maximal Sterile Barrier Technique was utilized including caps, mask, sterile gowns, sterile gloves, sterile drape, hand hygiene and skin antiseptic. A timeout was performed prior to the initiation of the procedure. Maximal barrier sterile technique utilized including caps, mask, sterile gowns, sterile gloves, large sterile drape, hand hygiene, and Betadine prep. Under sterile condition and local anesthesia, right internal jugular venous access was performed with ultrasound. An ultrasound image was saved and sent to PACS. Over a guidewire, the IVC filter  delivery sheath and inner dilator were advanced into the IVC just above the IVC bifurcation. Contrast injection was performed for an IVC venogram. Through the delivery sheath, a retrievable Denali IVC filter was deployed below the level of the renal veins and above the IVC bifurcation. Limited post deployment venacavagram was performed. The delivery sheath was removed and hemostasis was obtained with manual compression. A dressing was placed. The patient tolerated the procedure well without immediate post procedural complication. FINDINGS: The IVC is patent. No evidence of thrombus, stenosis, or occlusion. No variant venous anatomy. Successful placement of the IVC filter below the level of the renal veins. IMPRESSION: Successful ultrasound and fluoroscopically guided placement of an infrarenal retrievable IVC filter via right jugular approach. PLAN: This IVC filter is potentially retrievable. The patient will be assessed for filter retrieval by Interventional Radiology in approximately 8-12 weeks. Further recommendations regarding filter retrieval, continued surveillance or declaration of device permanence, will be made at that time. Electronically Signed   By: Jacqulynn Cadet M.D.   On: 03/16/2019 17:46   Vas [place] Lower Extremity Venous (dvt)  Result Date: 03/15/2019  Lower Venous Study Indications: Pain, Swelling, and Recent Covid, positive for DVT in left calf 03/08/19.  Comparison Study: Prior positve LLE venous study done 03/08/19 Performing Technologist: Sharion Dove RVS  Examination Guidelines: A complete evaluation includes B-mode imaging, spectral Doppler, color Doppler, and power Doppler as needed of all accessible portions of each vessel. Bilateral testing is considered an integral part of a complete examination. Limited examinations for reoccurring indications may be performed as noted.  +---------+---------------+---------+-----------+----------+--------------+  RIGHT      Compressibility Phasicity Spontaneity Properties Thrombus Aging  +---------+---------------+---------+-----------+----------+--------------+  CFV       Full  Yes       Yes                                    +---------+---------------+---------+-----------+----------+--------------+  SFJ       Full                                                             +---------+---------------+---------+-----------+----------+--------------+  FV Prox   Full                                                             +---------+---------------+---------+-----------+----------+--------------+  FV Mid    Full                                                             +---------+---------------+---------+-----------+----------+--------------+  FV Distal Full                                                             +---------+---------------+---------+-----------+----------+--------------+  PFV       Full                                                             +---------+---------------+---------+-----------+----------+--------------+  POP       Full            Yes       Yes                                    +---------+---------------+---------+-----------+----------+--------------+  PTV       None            No        No                     Acute           +---------+---------------+---------+-----------+----------+--------------+  PERO      Full                                                             +---------+---------------+---------+-----------+----------+--------------+     Summary: Right: Findings consistent with acute deep vein thrombosis involving the right posterior tibial veins.  *See table(s)  above for measurements and observations. Electronically signed by Monica Martinez MD on 03/15/2019 at 5:01:39 PM.    Final    Vas [place] Lower Extremity Venous (dvt)  Result Date: 03/09/2019  Lower Venous Study Indications: Calf cramping. Recent Covid.  Risk Factors: DVT 07/04/14, positive in left peroneal  vein. Comparison Study: Prior study from 07/04/14 is available for comparison Performing Technologist: Sharion Dove RVS  Examination Guidelines: A complete evaluation includes B-mode imaging, spectral Doppler, color Doppler, and power Doppler as needed of all accessible portions of each vessel. Bilateral testing is considered an integral part of a complete examination. Limited examinations for reoccurring indications may be performed as noted.  +-----+---------------+---------+-----------+----------+--------------+  RIGHT Compressibility Phasicity Spontaneity Properties Thrombus Aging  +-----+---------------+---------+-----------+----------+--------------+  CFV   Full            Yes       Yes                                    +-----+---------------+---------+-----------+----------+--------------+   +---------+---------------+---------+-----------+----------+--------------+  LEFT      Compressibility Phasicity Spontaneity Properties Thrombus Aging  +---------+---------------+---------+-----------+----------+--------------+  CFV       Full            Yes       Yes                                    +---------+---------------+---------+-----------+----------+--------------+  SFJ       Full                                                             +---------+---------------+---------+-----------+----------+--------------+  FV Prox   Full                                                             +---------+---------------+---------+-----------+----------+--------------+  FV Mid    Full                                                             +---------+---------------+---------+-----------+----------+--------------+  FV Distal Full                                                             +---------+---------------+---------+-----------+----------+--------------+  PFV       Full                                                              +---------+---------------+---------+-----------+----------+--------------+  POP       Full            Yes       Yes                                    +---------+---------------+---------+-----------+----------+--------------+  PTV       None                                             Acute           +---------+---------------+---------+-----------+----------+--------------+  PERO      None                                             Acute           +---------+---------------+---------+-----------+----------+--------------+     Summary: Right: No evidence of common femoral vein obstruction. Left: Findings consistent with acute deep vein thrombosis involving the left posterior tibial veins, and left peroneal veins.  *See table(s) above for measurements and observations. Electronically signed by Monica Martinez MD on 03/09/2019 at 4:12:19 PM.    Final     ASSESSMENT & PLAN:   1) h/o Lower extremity DVT 2015 triggered by prostatectomy 2) Recent lower extremity DVT triggered by COVID 19 infection, pneumonia, high dose steroids. S/p IVC filter due to epistaxis 3) Epistaxis while on anticoagulation ? Etiology PLAN -Discussed patient's most recent labs from 03/17/2019, all values are WNL except for RBC at 2.73, Hgb at 8.5, HCT at 26.4, RDW at 16.3, Calcium at 8.3. -Discussed 03/08/2019 VAS [place] Lower Extremity Venous Left ([id number]) which revealed "Right: No evidence of common femoral vein obstruction. Left: Findings consistent with acute deep vein thrombosis involving the left posterior tibial veins, and left peroneal veins."  -Discussed 03/15/2019 VAS [place] Lower Extremity Venous Right ([id number]) which revealed "Right: Findings consistent with acute deep vein thrombosis involving the right posterior tibial veins." -Discussed that both episodes of DVT were triggered by other medical issues  -Recommend pt to use sterile saline sprays, Vaseline inside of nasal lining, hot air humidifier at night and Afrin as  needed -Will place pt on preventive dose of Eliquis 2.5mg  po BID for now and if no epistaxisor bleeding issues then will swtich to full dose after ENTevaluation for total of 6 months, provided pt does not have any bleeding incidents. -if tolerating anticoagulation - will need to have IVC filter removed -Will order CT sinus  -Will refer to ENT, Dr. Lucia Gaskins -Will get labs today -Will see back in 2 months with labs   FOLLOW UP: -Labs today -ENT referral to Dr Lucia Gaskins for evaluation of severe epistaxis on blood -thinner in 1-2 weeks -CT sinus in 1 week -RTC with Dr Irene Limbo with labs in 2 months   All of the patients questions were answered with apparent satisfaction. The patient knows to call the clinic with any problems, questions or concerns.  I spent 30 mins counseling the patient face to face. The total time spent in the appointment was 5mins  and more than 50% was on counseling and direct patient cares.    Sullivan Lone MD [place] AAHIVMS [facility] Hematology/Oncology Physician [facility]  (Office):       [phone] (Work cell):  [phone] (Fax):           [phone]  04/07/2019 2:25 PM  I, Yevette Edwards, am acting as a scribe for Dr. Sullivan Lone.   .I have reviewed the above documentation for accuracy and completeness, and I agree with the above. Brunetta Genera MD

## 2019-04-11 ENCOUNTER — Inpatient Hospital Stay: Payer: BC Managed Care – PPO | Attending: Hematology | Admitting: Hematology

## 2019-04-11 ENCOUNTER — Other Ambulatory Visit: Payer: Self-pay

## 2019-04-11 ENCOUNTER — Inpatient Hospital Stay: Payer: BC Managed Care – PPO

## 2019-04-11 ENCOUNTER — Telehealth: Payer: Self-pay | Admitting: Hematology

## 2019-04-11 VITALS — BP 139/87 | HR 88 | Temp 97.8°F | Resp 18 | Ht 70.0 in | Wt 210.1 lb

## 2019-04-11 DIAGNOSIS — I82441 Acute embolism and thrombosis of right tibial vein: Secondary | ICD-10-CM | POA: Diagnosis not present

## 2019-04-11 DIAGNOSIS — R040 Epistaxis: Secondary | ICD-10-CM

## 2019-04-11 DIAGNOSIS — Z8546 Personal history of malignant neoplasm of prostate: Secondary | ICD-10-CM | POA: Diagnosis not present

## 2019-04-11 DIAGNOSIS — Z8619 Personal history of other infectious and parasitic diseases: Secondary | ICD-10-CM | POA: Insufficient documentation

## 2019-04-11 DIAGNOSIS — I82442 Acute embolism and thrombosis of left tibial vein: Secondary | ICD-10-CM | POA: Diagnosis present

## 2019-04-11 DIAGNOSIS — I824Z3 Acute embolism and thrombosis of unspecified deep veins of distal lower extremity, bilateral: Secondary | ICD-10-CM

## 2019-04-11 DIAGNOSIS — I82452 Acute embolism and thrombosis of left peroneal vein: Secondary | ICD-10-CM

## 2019-04-11 DIAGNOSIS — D649 Anemia, unspecified: Secondary | ICD-10-CM

## 2019-04-11 LAB — CMP (CANCER CENTER ONLY)
ALT: 53 U/L — ABNORMAL HIGH (ref 0–44)
AST: 24 U/L (ref 15–41)
Albumin: 4.3 g/dL (ref 3.5–5.0)
Alkaline Phosphatase: 62 U/L (ref 38–126)
Anion gap: 9 (ref 5–15)
BUN: 13 mg/dL (ref 8–23)
CO2: 27 mmol/L (ref 22–32)
Calcium: 9.9 mg/dL (ref 8.9–10.3)
Chloride: 104 mmol/L (ref 98–111)
Creatinine: 0.84 mg/dL (ref 0.61–1.24)
GFR, Est AFR Am: >60 mL/min (ref >60)
GFR, Estimated: >60 mL/min (ref >60)
Glucose, Bld: 92 mg/dL (ref 70–99)
Potassium: 4.3 mmol/L (ref 3.5–5.1)
Sodium: 140 mmol/L (ref 135–145)
Total Bilirubin: 0.4 mg/dL (ref 0.3–1.2)
Total Protein: 7.7 g/dL (ref 6.5–8.1)

## 2019-04-11 LAB — CBC WITH DIFFERENTIAL/PLATELET
Abs Immature Granulocytes: 0.04 10*3/uL (ref 0.00–0.07)
Basophils Absolute: 0 10*3/uL (ref 0.0–0.1)
Basophils Relative: 0 %
Eosinophils Absolute: 0.2 10*3/uL (ref 0.0–0.5)
Eosinophils Relative: 1 %
HCT: 44.3 % (ref 39.0–52.0)
Hemoglobin: 14.1 g/dL (ref 13.0–17.0)
Immature Granulocytes: 0 %
Lymphocytes Relative: 9 %
Lymphs Abs: 1.2 10*3/uL (ref 0.7–4.0)
MCH: 29.9 pg (ref 26.0–34.0)
MCHC: 31.8 g/dL (ref 30.0–36.0)
MCV: 94.1 fL (ref 80.0–100.0)
Monocytes Absolute: 1 10*3/uL (ref 0.1–1.0)
Monocytes Relative: 8 %
Neutro Abs: 10.6 10*3/uL — ABNORMAL HIGH (ref 1.7–7.7)
Neutrophils Relative %: 82 %
Platelets: 348 10*3/uL (ref 150–400)
RBC: 4.71 MIL/uL (ref 4.22–5.81)
RDW: 15.9 % — ABNORMAL HIGH (ref 11.5–15.5)
WBC: 12.9 10*3/uL — ABNORMAL HIGH (ref 4.0–10.5)
nRBC: 0 % (ref 0.0–0.2)

## 2019-04-11 LAB — IRON AND TIBC
Iron: 55 ug/dL (ref 42–163)
Saturation Ratios: 16 % — ABNORMAL LOW (ref 20–55)
TIBC: 339 ug/dL (ref 202–409)
UIBC: 284 ug/dL (ref 117–376)

## 2019-04-11 LAB — APTT: aPTT: 31 seconds (ref 24–36)

## 2019-04-11 LAB — FIBRINOGEN: Fibrinogen: 368 mg/dL (ref 210–475)

## 2019-04-11 LAB — FERRITIN: Ferritin: 100 ng/mL (ref 24–336)

## 2019-04-11 LAB — PROTIME-INR
INR: 1 (ref 0.8–1.2)
Prothrombin Time: 13.4 seconds (ref 11.4–15.2)

## 2019-04-11 MED ORDER — APIXABAN 2.5 MG PO TABS: 2.5000 mg | ORAL_TABLET | Freq: Two times a day (BID) | ORAL | 1 refills | Status: DC

## 2019-04-11 NOTE — Patient Instructions (Signed)
Thank you for choosing [facility] to provide your oncology and hematology care.   Should you have questions after your visit to the [facility] ([facility]), please contact this office at [phone] between 8:30 AM and 4:30 PM. Voicemails left after 4:00 PM may not be returned until the following business day. Calls received after 4:30 PM will be answered by an off-site Nurse Triage Line.    Prescription Refills:  Please have your pharmacy contact us directly for most prescription requests.  Contact the office directly for refills of narcotics (pain medications). Allow 48-72 hours for refills.  Appointments: Please contact the [facility] scheduling department [phone] for questions regarding [facility] appointment scheduling.  Contact the schedulers with any scheduling changes so that your appointment can be rescheduled in a timely manner.   Central Scheduling for  [phone] - Call to schedule procedures such as PET scans, CT scans, MRI, Ultrasound, etc.  To afford each patient quality time with our providers, please arrive 30 minutes before your scheduled appointment time.  If you arrive late for your appointment, you may be asked to reschedule.  We strive to give you quality time with our providers, and arriving late affects you and other patients whose appointments are after yours. If you are a no show for multiple scheduled visits, you may be dismissed from the clinic at the providers discretion.     Resources: [facility] Social Workers [phone] for additional information on assistance programs --Anne Cunningham/Abigail Elmore  [place] DSS  [phone]: Information regarding food stamps, Medicaid, and utility assistance [place] [phone]   [place] [employer]'s shared-ride transportation service for eligible riders who have a disability that prevents them from riding the fixed route bus.   Medicare Rights Center 800-333-4114 Helps people with  Medicare understand their rights and benefits, navigate the Medicare system, and secure the quality healthcare they deserve [employer] 800-227-2345 Assists patients locate various types of support and financial assistance Cancer Care: 1-800-813-HOPE (4673) Provides financial assistance, online support groups, medication/co-pay assistance.      

## 2019-04-11 NOTE — Telephone Encounter (Signed)
Scheduled lab add on per 09/23 los.

## 2019-04-12 ENCOUNTER — Telehealth: Payer: Self-pay | Admitting: Hematology

## 2019-04-12 NOTE — Telephone Encounter (Signed)
Scheduled appt per 9/23 los.  Faxed referral to Lucia Gaskins [phone] fax number)  Patient aware of the appt date and time

## 2019-04-18 ENCOUNTER — Ambulatory Visit (HOSPITAL_COMMUNITY)
Admission: RE | Admit: 2019-04-18 | Discharge: 2019-04-18 | Disposition: A | Discharge status: Home / Self Care | Payer: BC Managed Care – PPO | Source: Ambulatory Visit | Attending: Hematology | Admitting: Hematology

## 2019-04-18 ENCOUNTER — Other Ambulatory Visit: Payer: Self-pay

## 2019-04-18 ENCOUNTER — Encounter: Payer: Self-pay | Admitting: Hematology

## 2019-04-18 DIAGNOSIS — R040 Epistaxis: Secondary | ICD-10-CM | POA: Diagnosis present

## 2019-04-18 MED ORDER — SODIUM CHLORIDE (PF) 0.9 % IJ SOLN
INTRAMUSCULAR | Status: AC
  Filled 2019-04-18: qty 50

## 2019-04-18 MED ORDER — IOHEXOL 300 MG/ML  SOLN
75.0000 mL | Freq: Once | INTRAMUSCULAR | Status: AC | PRN
  Administered 2019-04-18: 16:00:00 75 mL via INTRAVENOUS

## 2019-04-30 ENCOUNTER — Other Ambulatory Visit: Payer: Self-pay | Admitting: Interventional Radiology

## 2019-04-30 DIAGNOSIS — Z95828 Presence of other vascular implants and grafts: Secondary | ICD-10-CM

## 2019-05-22 ENCOUNTER — Encounter: Payer: Self-pay | Admitting: *Deleted

## 2019-05-22 ENCOUNTER — Ambulatory Visit
Admission: RE | Admit: 2019-05-22 | Discharge: 2019-05-22 | Disposition: A | Discharge status: Home / Self Care | Payer: BC Managed Care – PPO | Source: Ambulatory Visit | Attending: Interventional Radiology | Admitting: Interventional Radiology

## 2019-05-22 DIAGNOSIS — Z95828 Presence of other vascular implants and grafts: Secondary | ICD-10-CM

## 2019-05-22 HISTORY — PX: IR RADIOLOGIST EVAL & MGMT: IMG5224

## 2019-05-22 NOTE — Progress Notes (Signed)
Chief Complaint: Patient was seen in consultation today for IVC filter in place at the request of [place]  Referring Physician(s): Yurianna Tusing  History of Present Illness: Ronald Arnold is a 63 y.o. male who underwent placement of an IVC filter on 03/16/2019 for a setting of bilateral acute provoked calf DVT with an absolute contraindication to anticoagulation.  He was experiencing severe epistaxis resulting in significant blood loss requiring blood transfusion.  His DVT was considered provoked as it occurred after a prolonged hospital admission for COVID-19 pneumonia.  He tolerated placement of the IVC filter well and was ultimately discharged.  Several weeks after discharge he began a trial of anticoagulation with Xarelto.  He was unable to tolerate the Xarelto.  Subsequently, he began Eliquis and has been taking low-dose Eliquis without complication.  He presents today for follow-up evaluation.  He denies calf pain, calf or ankle swelling, chest pain or shortness of breath.  He has fairly minimal residual symptoms related to his COVID-19 episode.  He has some residual left ulnar neuropathy which he believes is related to an IV placement at the antecubital fossa.  Otherwise, he is doing well.  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  Past Medical History:  Diagnosis Date  . Cancer [facility])    prostate cancer   . COVID-19 virus infection     Past Surgical History:  Procedure Laterality Date  . IR IVC FILTER PLMT / S&I /IMG GUID/MOD SED  03/16/2019  . IR RADIOLOGIST EVAL & MGMT  05/22/2019  . LYMPHADENECTOMY Bilateral 06/20/2014   Procedure: LYMPHADENECTOMY;  Surgeon: Raynelle Bring, MD;  Location: [facility];  Service: Urology;  Laterality: Bilateral;  . ROBOT ASSISTED LAPAROSCOPIC RADICAL PROSTATECTOMY N/A 06/20/2014   Procedure: ROBOTIC ASSISTED LAPAROSCOPIC RADICAL PROSTATECTOMY LEVEL 2;  Surgeon: Raynelle Bring, MD;  Location: [facility];  Service: Urology;  Laterality: N/A;  .  TONSILLECTOMY     to correct sleep apnea     Allergies: Sulfa antibiotics  Medications: Prior to Admission medications   Medication Sig Start Date End Date Taking? Authorizing Provider  acetaminophen (TYLENOL) 325 MG tablet Take 2 tablets (650 mg total) by mouth every 6 (six) hours as needed for mild pain (or Fever >/= 101). 03/04/19   Cherene Altes, MD  albuterol (VENTOLIN HFA) 108 (90 Base) MCG/ACT inhaler Inhale 1-2 puffs into the lungs every 4 (four) hours as needed for wheezing or shortness of breath. 03/04/19   Cherene Altes, MD  apixaban (ELIQUIS) 2.5 MG TABS tablet Take 1 tablet (2.5 mg total) by mouth 2 (two) times daily. Will increase to full dose 5mg  po BID in 1 month if no issues with recurrent bleeding/epistaxis 04/11/19   Brunetta Genera, MD  Ascorbic Acid (VITAMIN C) 1000 MG tablet Take 1,000 mg by mouth daily.    [provider]  benzonatate (TESSALON) 200 MG capsule Take 1 capsule (200 mg total) by mouth 3 (three) times daily as needed for cough. 03/04/19   Cherene Altes, MD  chlorpheniramine-HYDROcodone (TUSSIONEX) 10-8 MG/5ML SUER Take 5 mLs by mouth every 12 (twelve) hours as needed for cough. 03/04/19   Cherene Altes, MD  Cholecalciferol (VITAMIN D-3) 25 MCG (1000 UT) CAPS Take 2,000 Units by mouth daily.    [provider]  fexofenadine (ALLEGRA) 180 MG tablet Take 180 mg by mouth daily.    [provider]  fluticasone (FLONASE) 50 MCG/ACT nasal spray Place 1 spray into both nostrils daily.  [provider]  Multiple Vitamin (MULTIVITAMIN WITH MINERALS) TABS tablet Take 1 tablet by mouth daily.    [provider]  oxymetazoline (AFRIN) 0.05 % nasal spray Place 1 spray into both nostrils 2 (two) times daily as needed for congestion. 03/17/19   Lavina Hamman, MD  rosuvastatin (CRESTOR) 5 MG tablet Take 5 mg by mouth daily. 12/19/18   [provider]     No family history on file.  Social History    Socioeconomic History  . Marital status: Married    Spouse name: Not on file  . Number of children: Not on file  . Years of education: Not on file  . Highest education level: Not on file  Occupational History  . Not on file  Social Needs  . Financial resource strain: Not on file  . Food insecurity    Worry: Not on file    Inability: Not on file  . Transportation needs    Medical: Not on file    Non-medical: Not on file  Tobacco Use  . Smoking status: Never Smoker  . Smokeless tobacco: Never Used  Substance and Sexual Activity  . Alcohol use: Yes    Comment: occasional beer or wine   . Drug use: No  . Sexual activity: Not on file  Lifestyle  . Physical activity    Days per week: Not on file    Minutes per session: Not on file  . Stress: Not on file  Relationships  . Social Herbalist on phone: Not on file    Gets together: Not on file    Attends religious service: Not on file    Active member of club or organization: Not on file    Attends meetings of clubs or organizations: Not on file    Relationship status: Not on file  Other Topics Concern  . Not on file  Social History Narrative  . Not on file    Review of Systems: A 12 point ROS discussed and pertinent positives are indicated in the HPI above.  All other systems are negative.  Review of Systems  Vital Signs: BP (!) 163/89 (BP Location: Left Arm)   Pulse 87   SpO2 97%   Physical Exam Vitals signs and nursing note reviewed.  Constitutional:      Appearance: Normal appearance.  HENT:     Head: Normocephalic and atraumatic.  Eyes:     General: No scleral icterus. Cardiovascular:     Rate and Rhythm: Normal rate.  Pulmonary:     Effort: Pulmonary effort is normal.  Abdominal:     General: Abdomen is flat.     Palpations: Abdomen is soft.  Neurological:     Mental Status: He is alert and oriented to person, place, and time.  Psychiatric:        Mood and Affect: Mood normal.         Behavior: Behavior normal.      Imaging: Ir Radiologist Eval & Mgmt  Result Date: 05/22/2019 Please refer to notes tab for details about interventional procedure. (Op Note)   Labs:  CBC: Recent Labs    03/15/19 0336 03/16/19 0753 03/17/19 0405 04/11/19 1155  WBC 9.1 7.9 6.8 12.9*  HGB 8.4* 8.4* 8.5* 14.1  HCT 26.0* 26.9* 26.4* 44.3  PLT 196 219 278 348    COAGS: Recent Labs    03/16/19 0351 04/11/19 1154  INR 1.1 1.0  APTT  --  31  BMP: Recent Labs    03/15/19 0336 03/16/19 0753 03/17/19 0405 04/11/19 1155  NA 137 141 139 140  K 4.0 4.3 3.9 4.3  CL 107 108 109 104  CO2 22 26 25 27   GLUCOSE 89 96 91 92  BUN 12 12 12 13   CALCIUM 8.2* 8.4* 8.3* 9.9  CREATININE 0.71 0.67 0.68 0.84  GFRNONAA >60 >60 >60 >60  GFRAA >60 >60 >60 >60    LIVER FUNCTION TESTS: Recent Labs    03/02/19 0227 03/03/19 0050 03/15/19 0336 04/11/19 1155  BILITOT 0.7 0.9 0.4 0.4  AST 33 29 19 24   ALT 82* 85* 32 53*  ALKPHOS 40 47 44 62  PROT 5.8* 6.5 5.8* 7.7  ALBUMIN 3.1* 3.3* 2.6* 4.3    TUMOR MARKERS: No results for input(s): AFPTM, CEA, CA199, CHROMGRNA in the last 8760 hours.  Assessment and Plan:  63 year old gentleman with provoked bilateral calf DVT in the setting of recent Covid pneumonia and prolonged hospital admission.  An IVC filter was placed for severe epistaxis requiring transfusion.  Patient has subsequently been able to resume anticoagulation with low-dose Eliquis.  Additionally, his bilateral calf DVT have completely resolved.  Therefore, the IVC filter is no longer necessary and can be retrieved.  We discussed the risks, benefits and alternatives to IVC filter retrieval.  I was able to answer all of his questions.  1.)  Schedule for IVC filter retrieval by Dr. Laurence Ferrari at either [facility] or [facility].  Please work with patient regarding his schedule and optimal timing.  Filter should be removed before the end of the year.     Electronically Signed: Jacqulynn Cadet 05/22/2019, 3:25 PM   I spent a total of  25 Minutes in face to face in clinical consultation, greater than 50% of which was counseling/coordinating care for IVC filter in place, history of bilateral calf DVT.

## 2019-05-23 ENCOUNTER — Telehealth (HOSPITAL_COMMUNITY): Payer: Self-pay

## 2019-05-23 ENCOUNTER — Other Ambulatory Visit (HOSPITAL_COMMUNITY): Payer: Self-pay | Admitting: Interventional Radiology

## 2019-05-23 DIAGNOSIS — Z95828 Presence of other vascular implants and grafts: Secondary | ICD-10-CM

## 2019-05-23 NOTE — Telephone Encounter (Signed)
Called to schedule ivc filter retrieval, no answer, left vm. AW 

## 2019-05-29 ENCOUNTER — Other Ambulatory Visit: Payer: Self-pay

## 2019-05-29 ENCOUNTER — Encounter: Payer: Self-pay | Admitting: Neurology

## 2019-05-29 ENCOUNTER — Ambulatory Visit (INDEPENDENT_AMBULATORY_CARE_PROVIDER_SITE_OTHER): Payer: BC Managed Care – PPO | Admitting: Neurology

## 2019-05-29 VITALS — BP 137/99 | HR 88 | Temp 97.0°F | Ht 70.0 in | Wt 217.5 lb

## 2019-05-29 DIAGNOSIS — R202 Paresthesia of skin: Secondary | ICD-10-CM | POA: Diagnosis not present

## 2019-05-29 MED ORDER — METANX 3-90.314-2-35 MG PO CAPS: 1.0000 | ORAL_CAPSULE | Freq: Two times a day (BID) | ORAL | 11 refills | Status: DC

## 2019-05-29 NOTE — Progress Notes (Signed)
PATIENT: Ronald Arnold DOB: 1955/10/10  Chief Complaint  Patient presents with  . Possible Ulnar Neuropathy    He is having numbness in his left fourth and fifth finger.    Marland Kitchen PCP    Lawerance Cruel, MD     Portal is a 63 year old male, seen in request by my care physician Dr. Harrington Challenger, [name] for evaluation of left hand numbness, initial evaluation was on May 29, 2019.  I have reviewed and summarized the referring note from the referring physician.  He had past medical history of prostate cancer, following prostatectomy, he developed a left calf DVT, PE and achy pain of calf area, he was treated with Xarelto  He presented to the emergency room for COVID-19 infection at the end of July 2020, shortness of breath, cough, job of oxygen saturation, chest x-ray showed bilateral hazy airspace opacity, with multifocal pneumonia,  he was treated with Decadron,  Remdesivir, and Actemra, had prolonged ICU stay, heated high flow nasal cannula oxygen, but did not require intubation, he was readmitted to the hospital on March 12, 2019 for severe epistaxis, hemoglobin dropped to 7.3, required blood transfusion, then developed bright lower extremity DVT, had IVC filter placement on March 16, 2019, is now taking low-dose Eliquis 2.5 mg twice a day  He had IV catheter placement at left antebrachial fossa, shortly afterwards, he noticed swelling of eeg size at insertion site, that gradually disappeared with heart compression, then he began to notice left 5th and fourth finger numbness, sensitivity, only mild numbness of left third finger, he denies weakness, but mild clumsiness when typing with his left hand, denies pain, he denies significant neck pain, no right arm involvement, no gait abnormality.  He denies left elbow pain  REVIEW OF SYSTEMS: Full 14 system review of systems performed and notable only for as above All other review of systems were negative.  ALLERGIES:  Allergies  Allergen Reactions  . Sulfa Antibiotics Other (See Comments)    As a child patient is unaware of reaction    HOME MEDICATIONS: Current Outpatient Medications  Medication Sig Dispense Refill  . apixaban (ELIQUIS) 2.5 MG TABS tablet Take 1 tablet (2.5 mg total) by mouth 2 (two) times daily. Will increase to full dose 5mg  po BID in 1 month if no issues with recurrent bleeding/epistaxis 60 tablet 1  . Ascorbic Acid (VITAMIN C) 1000 MG tablet Take 1,000 mg by mouth daily.    . Cholecalciferol (VITAMIN D-3) 25 MCG (1000 UT) CAPS Take 2,000 Units by mouth daily.    . fexofenadine (ALLEGRA) 180 MG tablet Take 180 mg by mouth daily.    . fluticasone (FLONASE) 50 MCG/ACT nasal spray Place 1 spray into both nostrils daily.     . Multiple Vitamin (MULTIVITAMIN WITH MINERALS) TABS tablet Take 1 tablet by mouth daily.    . Multiple Vitamins-Minerals (ZINC PO) Take 1 tablet by mouth daily. Takes Zinc daily.    . rosuvastatin (CRESTOR) 5 MG tablet Take 5 mg by mouth daily.     No current facility-administered medications for this visit.     PAST MEDICAL HISTORY: Past Medical History:  Diagnosis Date  . Cancer [facility])    prostate cancer   . COVID-19 virus infection   . Numbness     PAST SURGICAL HISTORY: Past Surgical History:  Procedure Laterality Date  . IR IVC FILTER PLMT / S&I /IMG GUID/MOD SED  03/16/2019  . IR RADIOLOGIST EVAL & MGMT  05/22/2019  . LYMPHADENECTOMY Bilateral 06/20/2014   Procedure: LYMPHADENECTOMY;  Surgeon: Raynelle Bring, MD;  Location: [facility];  Service: Urology;  Laterality: Bilateral;  . ROBOT ASSISTED LAPAROSCOPIC RADICAL PROSTATECTOMY N/A 06/20/2014   Procedure: ROBOTIC ASSISTED LAPAROSCOPIC RADICAL PROSTATECTOMY LEVEL 2;  Surgeon: Raynelle Bring, MD;  Location: [facility];  Service: Urology;  Laterality: N/A;  . TONSILLECTOMY     to correct sleep apnea     FAMILY HISTORY: Family History  Problem Relation Age of Onset  . Diabetes Mother        died at age 19   . Hypertension Mother   . Other Father        enlarged prostate - died at age 47    SOCIAL HISTORY: Social History   Socioeconomic History  . Marital status: Married    Spouse name: Not on file  . Number of children: 3  . Years of education: college  . Highest education level: Doctorate  Occupational History  . Occupation: Estate manager/land agent professor  Social Needs  . Financial resource strain: Not on file  . Food insecurity    Worry: Not on file    Inability: Not on file  . Transportation needs    Medical: Not on file    Non-medical: Not on file  Tobacco Use  . Smoking status: Never Smoker  . Smokeless tobacco: Never Used  Substance and Sexual Activity  . Alcohol use: Yes    Comment: occasional beer or wine   . Drug use: No  . Sexual activity: Not on file  Lifestyle  . Physical activity    Days per week: Not on file    Minutes per session: Not on file  . Stress: Not on file  Relationships  . Social Herbalist on phone: Not on file    Gets together: Not on file    Attends religious service: Not on file    Active member of club or organization: Not on file    Attends meetings of clubs or organizations: Not on file    Relationship status: Not on file  . Intimate partner violence    Fear of current or ex partner: Not on file    Emotionally abused: Not on file    Physically abused: Not on file    Forced sexual activity: Not on file  Other Topics Concern  . Not on file  Social History Narrative   Lives at home with his wife.   1-2 cups per day.   Right-handed.     PHYSICAL EXAM   Vitals:   05/29/19 1034  BP: (!) 137/99  Pulse: 88  Temp: (!) 97 F (36.1 C)  Weight: 217 lb 8 oz (98.7 kg)  Height: 5\' 10"  (1.778 m)    Not recorded      Body mass index is 31.21 kg/m.  PHYSICAL EXAMNIATION:  Gen: NAD, conversant, well nourised, well groomed                     Cardiovascular: Regular rate rhythm, no peripheral edema, warm, nontender. Eyes:  Conjunctivae clear without exudates or hemorrhage Neck: Supple, no carotid bruits. Pulmonary: Clear to auscultation bilaterally   NEUROLOGICAL EXAM:  MENTAL STATUS: Speech:    Speech is normal; fluent and spontaneous with normal comprehension.  Cognition:     Orientation to time, place and person     Normal recent and remote memory     Normal Attention span and concentration  Normal Language, naming, repeating,spontaneous speech     Fund of knowledge   CRANIAL NERVES: CN II: Visual fields are full to confrontation.  Pupils are round equal and briskly reactive to light. CN III, IV, VI: extraocular movement are normal. No ptosis. CN V: Facial sensation is intact to pinprick in all 3 divisions bilaterally. Corneal responses are intact.  CN VII: Face is symmetric with normal eye closure and smile. CN VIII: Hearing is normal to causal conversation. CN IX, X: Palate elevates symmetrically. Phonation is normal. CN XI: Head turning and shoulder shrug are intact CN XII: Tongue is midline with normal movements and no atrophy.  MOTOR: There is no pronator drift of out-stretched arms. Muscle bulk and tone are normal. Muscle strength is normal.  REFLEXES: Reflexes are 2+ and symmetric at the biceps, triceps, knees, and ankles. Plantar responses are flexor.  SENSORY: Decreased light touch, pinprick of left 5th, 4th fingers, extending to left palm, and dorsal hand on ulnar side,   COORDINATION: Rapid alternating movements and fine finger movements are intact. There is no dysmetria on finger-to-nose and heel-knee-shin.    GAIT/STANCE: Posture is normal. Gait is steady with normal steps, base, arm swing, and turning. Heel and toe walking are normal. Tandem gait is normal.  Romberg is absent.   DIAGNOSTIC DATA (LABS, IMAGING, TESTING) - I reviewed patient records, labs, notes, testing and imaging myself where available.   ASSESSMENT AND PLAN  Ronald Arnold is a 63 y.o. male    Left hand numbness  Mainly in the distribution of left ulnar nerve, only sensory involvement, no motor weakness  Will try Metnax 1 capsule twice a day  If he remains symptomatic,  may consider EMG nerve conduction study   Marcial Pacas, M.D. Ph.D.  [facility] [address] [place], [place] [address] Ph: [phone] Fax: [phone]  CC: Lawerance Cruel, MD

## 2019-06-03 ENCOUNTER — Other Ambulatory Visit: Payer: Self-pay | Admitting: Hematology

## 2019-06-04 ENCOUNTER — Encounter: Payer: Self-pay | Admitting: Hematology

## 2019-06-05 NOTE — Telephone Encounter (Signed)
Patient contacted office - reviewed information from Dr. Efrain Sella ordered,---increased dose to 5mg  po BID from 2.5mg . If tolerating full dose Eliquis without bleeding issues that would be a criterion to allow for IVC filter removal. Patient verbalized understanding.  Patient could call IR to move his IVC filter removal forward so that it is after his appt with Dr.Kale if he chooses. Patient verbalized understanding of med change - states he will contact office to give update on IVC removal date.

## 2019-06-06 ENCOUNTER — Telehealth (HOSPITAL_COMMUNITY): Payer: Self-pay

## 2019-06-06 NOTE — Telephone Encounter (Signed)
Returned pt's call, no answer, left vm. AW  

## 2019-06-07 ENCOUNTER — Telehealth: Payer: Self-pay | Admitting: Hematology

## 2019-06-07 NOTE — Telephone Encounter (Signed)
[facility] 11/24 appointments moved to 12/2. Left message for patient on both home/cell phone. Schedule mailed.

## 2019-06-12 ENCOUNTER — Other Ambulatory Visit: Payer: BC Managed Care – PPO

## 2019-06-12 ENCOUNTER — Ambulatory Visit: Payer: BC Managed Care – PPO | Admitting: Hematology

## 2019-06-19 ENCOUNTER — Ambulatory Visit (HOSPITAL_COMMUNITY): Payer: BC Managed Care – PPO

## 2019-06-19 ENCOUNTER — Other Ambulatory Visit: Payer: Self-pay | Admitting: *Deleted

## 2019-06-19 ENCOUNTER — Encounter: Payer: Self-pay | Admitting: Hematology

## 2019-06-19 DIAGNOSIS — D649 Anemia, unspecified: Secondary | ICD-10-CM

## 2019-06-19 DIAGNOSIS — R040 Epistaxis: Secondary | ICD-10-CM

## 2019-06-19 DIAGNOSIS — I824Z3 Acute embolism and thrombosis of unspecified deep veins of distal lower extremity, bilateral: Secondary | ICD-10-CM

## 2019-06-20 ENCOUNTER — Other Ambulatory Visit: Payer: Self-pay | Admitting: Radiology

## 2019-06-20 ENCOUNTER — Inpatient Hospital Stay (HOSPITAL_BASED_OUTPATIENT_CLINIC_OR_DEPARTMENT_OTHER): Payer: BC Managed Care – PPO | Admitting: Hematology

## 2019-06-20 ENCOUNTER — Other Ambulatory Visit: Payer: Self-pay

## 2019-06-20 ENCOUNTER — Inpatient Hospital Stay: Payer: BC Managed Care – PPO | Attending: Hematology

## 2019-06-20 VITALS — BP 132/78 | HR 81 | Temp 97.8°F | Resp 18 | Ht 70.0 in | Wt 218.1 lb

## 2019-06-20 DIAGNOSIS — Z8619 Personal history of other infectious and parasitic diseases: Secondary | ICD-10-CM | POA: Insufficient documentation

## 2019-06-20 DIAGNOSIS — Z7901 Long term (current) use of anticoagulants: Secondary | ICD-10-CM | POA: Insufficient documentation

## 2019-06-20 DIAGNOSIS — I824Z3 Acute embolism and thrombosis of unspecified deep veins of distal lower extremity, bilateral: Secondary | ICD-10-CM

## 2019-06-20 DIAGNOSIS — D649 Anemia, unspecified: Secondary | ICD-10-CM

## 2019-06-20 DIAGNOSIS — R040 Epistaxis: Secondary | ICD-10-CM

## 2019-06-20 DIAGNOSIS — Z8546 Personal history of malignant neoplasm of prostate: Secondary | ICD-10-CM | POA: Insufficient documentation

## 2019-06-20 DIAGNOSIS — Z7982 Long term (current) use of aspirin: Secondary | ICD-10-CM | POA: Insufficient documentation

## 2019-06-20 LAB — APTT: aPTT: 35 seconds (ref 24–36)

## 2019-06-20 LAB — IRON AND TIBC
Iron: 66 ug/dL (ref 42–163)
Saturation Ratios: 17 % — ABNORMAL LOW (ref 20–55)
TIBC: 395 ug/dL (ref 202–409)
UIBC: 329 ug/dL (ref 117–376)

## 2019-06-20 LAB — CBC WITH DIFFERENTIAL (CANCER CENTER ONLY)
Abs Immature Granulocytes: 0.01 10*3/uL (ref 0.00–0.07)
Basophils Absolute: 0 10*3/uL (ref 0.0–0.1)
Basophils Relative: 1 %
Eosinophils Absolute: 0.1 10*3/uL (ref 0.0–0.5)
Eosinophils Relative: 2 %
HCT: 48.2 % (ref 39.0–52.0)
Hemoglobin: 15.6 g/dL (ref 13.0–17.0)
Immature Granulocytes: 0 %
Lymphocytes Relative: 31 %
Lymphs Abs: 1.8 10*3/uL (ref 0.7–4.0)
MCH: 28.6 pg (ref 26.0–34.0)
MCHC: 32.4 g/dL (ref 30.0–36.0)
MCV: 88.3 fL (ref 80.0–100.0)
Monocytes Absolute: 0.5 10*3/uL (ref 0.1–1.0)
Monocytes Relative: 8 %
Neutro Abs: 3.5 10*3/uL (ref 1.7–7.7)
Neutrophils Relative %: 58 %
Platelet Count: 240 10*3/uL (ref 150–400)
RBC: 5.46 MIL/uL (ref 4.22–5.81)
RDW: 13.4 % (ref 11.5–15.5)
WBC Count: 6 10*3/uL (ref 4.0–10.5)
nRBC: 0 % (ref 0.0–0.2)

## 2019-06-20 LAB — CMP (CANCER CENTER ONLY)
ALT: 43 U/L (ref 0–44)
AST: 33 U/L (ref 15–41)
Albumin: 4.3 g/dL (ref 3.5–5.0)
Alkaline Phosphatase: 66 U/L (ref 38–126)
Anion gap: 9 (ref 5–15)
BUN: 15 mg/dL (ref 8–23)
CO2: 28 mmol/L (ref 22–32)
Calcium: 9.5 mg/dL (ref 8.9–10.3)
Chloride: 103 mmol/L (ref 98–111)
Creatinine: 0.93 mg/dL (ref 0.61–1.24)
GFR, Est AFR Am: >60 mL/min (ref >60)
GFR, Estimated: >60 mL/min (ref >60)
Glucose, Bld: 113 mg/dL — ABNORMAL HIGH (ref 70–99)
Potassium: 4.1 mmol/L (ref 3.5–5.1)
Sodium: 140 mmol/L (ref 135–145)
Total Bilirubin: 0.3 mg/dL (ref 0.3–1.2)
Total Protein: 7.6 g/dL (ref 6.5–8.1)

## 2019-06-20 LAB — PROTIME-INR
INR: 1 (ref 0.8–1.2)
Prothrombin Time: 13.5 seconds (ref 11.4–15.2)

## 2019-06-20 LAB — FIBRINOGEN: Fibrinogen: 377 mg/dL (ref 210–475)

## 2019-06-20 LAB — FERRITIN: Ferritin: 22 ng/mL — ABNORMAL LOW (ref 24–336)

## 2019-06-20 NOTE — Progress Notes (Signed)
HEMATOLOGY/ONCOLOGY CONSULTATION NOTE  Date of Service: 06/20/2019  Patient Care Team: Lawerance Cruel, MD as PCP - General (Family Medicine)  CHIEF COMPLAINTS/PURPOSE OF CONSULTATION:  DVT  HISTORY OF PRESENTING ILLNESS:   Ronald Arnold is a wonderful 63 y.o. male who has been referred to [place] by Dr Lindi Adie for evaluation and management of DVT. We are joined today by his wife Jan, via phone. The pt reports that he is doing well overall.  In 2015 the pt had a Prostatectomy and was not placed on blood thinners directly after. About 2 weeks later he had a DVT in his left calf. He was then put on Xarelto 20 mg once a day for about 3 months and had no issues with that dose. On 02/17/2019 pt presented to the hospital for pneumonia secondary to COVID-19 viral infection. Pt was placed on Dexamethasone for his COVID19 infection but was not on asprin. On 03/08/2019 it was discovered that pt had another left lower extremity DVT. He was once again placed on Xarelto. A few days later he woke up and saw that he had a bleed all over his pillow due to a severe nosebleed. He continued to experience episodes of epistaxis over the next couple of days and was able to control them with pressure. Around dinner time on 03/12/2019 he had an episode that he was unable to stop so he went to the ER. They gave him an Afrin soaked cloth but did no chemical or surgical cauterization. Pt then began vomiting swallowed blood. The last time that he can remember having a nosebleed before was in 07/2018. He reports that his nosebleeds typically occur about once a year. Pt has no history of bleeding in other areas. The pt reports that he has not been seen by an ENT physician and no one has ever checked his nose to discover the cause of his nosebleeds. A right lower extremity DVT was discovered on 03/15/2019. On 03/16/2019 he was given an IVC Filter. They stopped his blood thinners before the procedure but restarted him after. He  was on blood thinners for less than a week and is currently off of them. He reports a knot in his left arm. He also notices that his last two digits of his left hand have been numb as well as a loss of sensation on the underside of his left arm since this procedure.   Pt has no other known medical problems. He has no family history of blood clots or other bleeding disorders. Today was his last day of his Prednisone course.  Pt is currently taking iron, folic acid, Vitamin C, Vitamin B12. Pt's stools are currently black due to the iron. He has been walking about a mile a day lately.    Of note prior to the patient's visit today, pt has had VAS [place] Lower Extremity Venous Left ([id number]) completed on 03/08/2019 with results revealing "Right: No evidence of common femoral vein obstruction. Left: Findings consistent with acute deep vein thrombosis involving the left posterior tibial veins, and left peroneal veins."   Pt also had VAS [place] Lower Extremity Venous Right ([id number]) completed on 03/15/2019 with results revealing "Right: Findings consistent with acute deep vein thrombosis involving the right posterior tibial veins."  Most recent lab results (03/17/2019) of CBC is as follows: all values are WNL except for RBC at 2.73, Hgb at 8.5, HCT at 26.4, RDW at 16.3, Calcium at 8.3.  On review of systems, pt reports finger numbness  in his left hand, left arm numbness/knot and denies bloody stools, abdominal pain, leg swelling, calf pain and any other symptoms.   On PMHx the pt reports DVT (both legs), COVID19 infection, Prostate Cancer, Prostatectomy (2015).  INTERVAL HISTORY:  Ronald Arnold is a wonderful 63 y.o. male who is here for evaluation and management of DVT. We are joined today by his wife Jan, via phone. The patient's last visit with [place] was on 04/11/2019. The pt reports that he is doing well overall.  The pt reports that he saw Dr. Lucia Gaskins, ENT, who found nothing concerning. Pt has not had any  nosebleeds in the interim. Pt waited until after seeing his ENT to begin taking his full dose of Eliquis. He has been taking 5 mg Eliquis BID for about 3 weeks now. Pt has also been to see an Interventional Radiologist who did not observe any old or new clots. They plan on removing his IVC filter this Friday. He has been on blood thinners for just under 2 months at this time. Pt has no known family history of clotting disorders. He has been to see a Neurologist for the numbness and tingling in his left arm and hand brought on after his hospitalization. Pt has been walking 1-2 miles per day.   Of note since the patient's last visit, pt has had US Venous Lower Bilateral (DVT) ([id number]) completed on 05/22/2019 with results revealing "No evidence of deep venous thrombosis in either lower extremity. The previously noted bilateral calf vein DVTs have resolved."  Lab results today (06/20/19) of CBC w/diff and CMP is as follows: all values are WNL except for Glucose at 113. 06/20/2019 Prothrombin Time at 13.5, INR at 1.0 06/20/2019 APTT at 35 06/20/2019 Fibrogen at 377 06/20/2019 Ferritin at 22 06/20/2019 Iron and TIBC is as follows: all values are WNL except for Sat Ratios at 17  On review of systems, pt reports numbness/tingling in left arm/hand and denies SOB, chest pain, leg swelling and any other symptoms.   MEDICAL HISTORY:  Past Medical History:  Diagnosis Date  . Cancer [facility])    prostate cancer   . COVID-19 virus infection   . Numbness     SURGICAL HISTORY: Past Surgical History:  Procedure Laterality Date  . IR IVC FILTER PLMT / S&I /IMG GUID/MOD SED  03/16/2019  . IR RADIOLOGIST EVAL & MGMT  05/22/2019  . LYMPHADENECTOMY Bilateral 06/20/2014   Procedure: LYMPHADENECTOMY;  Surgeon: Raynelle Bring, MD;  Location: [facility];  Service: Urology;  Laterality: Bilateral;  . ROBOT ASSISTED LAPAROSCOPIC RADICAL PROSTATECTOMY N/A 06/20/2014   Procedure: ROBOTIC ASSISTED LAPAROSCOPIC RADICAL  PROSTATECTOMY LEVEL 2;  Surgeon: Raynelle Bring, MD;  Location: [facility];  Service: Urology;  Laterality: N/A;  . TONSILLECTOMY     to correct sleep apnea     SOCIAL HISTORY: Social History   Socioeconomic History  . Marital status: Married    Spouse name: Not on file  . Number of children: 3  . Years of education: college  . Highest education level: Doctorate  Occupational History  . Occupation: Estate manager/land agent professor  Social Needs  . Financial resource strain: Not on file  . Food insecurity    Worry: Not on file    Inability: Not on file  . Transportation needs    Medical: Not on file    Non-medical: Not on file  Tobacco Use  . Smoking status: Never Smoker  . Smokeless tobacco: Never Used  Substance and Sexual Activity  . Alcohol  use: Yes    Comment: occasional beer or wine   . Drug use: No  . Sexual activity: Not on file  Lifestyle  . Physical activity    Days per week: Not on file    Minutes per session: Not on file  . Stress: Not on file  Relationships  . Social Herbalist on phone: Not on file    Gets together: Not on file    Attends religious service: Not on file    Active member of club or organization: Not on file    Attends meetings of clubs or organizations: Not on file    Relationship status: Not on file  . Intimate partner violence    Fear of current or ex partner: Not on file    Emotionally abused: Not on file    Physically abused: Not on file    Forced sexual activity: Not on file  Other Topics Concern  . Not on file  Social History Narrative   Lives at home with his wife.   1-2 cups per day.   Right-handed.    FAMILY HISTORY: Family History  Problem Relation Age of Onset  . Diabetes Mother        died at age 18  . Hypertension Mother   . Other Father        enlarged prostate - died at age 72    ALLERGIES:  is allergic to sulfa antibiotics.  MEDICATIONS:  Current Outpatient Medications  Medication Sig Dispense Refill  .  apixaban (ELIQUIS) 5 MG TABS tablet Take 1 tablet (5 mg total) by mouth 2 (two) times daily. 60 tablet 2  . Ascorbic Acid (VITAMIN C) 1000 MG tablet Take 1,000 mg by mouth daily.    . Cholecalciferol (VITAMIN D-3) 25 MCG (1000 UT) CAPS Take 2,000 Units by mouth daily.    . fexofenadine (ALLEGRA) 180 MG tablet Take 180 mg by mouth daily.    . fluticasone (FLONASE) 50 MCG/ACT nasal spray Place 1 spray into both nostrils daily.     Marland Kitchen L-Methylfolate-Algae-B12-B6 (METANX) 3-90.314-2-35 MG CAPS Take 1 capsule by mouth 2 (two) times daily. 60 capsule 11  . Multiple Vitamin (MULTIVITAMIN WITH MINERALS) TABS tablet Take 1 tablet by mouth daily.    . rosuvastatin (CRESTOR) 5 MG tablet Take 5 mg by mouth daily.    Marland Kitchen zinc gluconate 50 MG tablet Take 50 mg by mouth daily.     No current facility-administered medications for this visit.     REVIEW OF SYSTEMS:   A 10+ POINT REVIEW OF SYSTEMS WAS OBTAINED including neurology, dermatology, psychiatry, cardiac, respiratory, lymph, extremities, GI, GU, Musculoskeletal, constitutional, breasts, reproductive, HEENT.  All pertinent positives are noted in the HPI.  All others are negative.   PHYSICAL EXAMINATION: ECOG PERFORMANCE STATUS: 1 - Symptomatic but completely ambulatory  . Vitals:   06/20/19 1435  BP: 132/78  Pulse: 81  Resp: 18  Temp: 97.8 F (36.6 C)  SpO2: 97%   Filed Weights   06/20/19 1435  Weight: 218 lb 1.6 oz (98.9 kg)   .Body mass index is 31.29 kg/m.  GENERAL:alert, in no acute distress and comfortable SKIN: no acute rashes, no significant lesions EYES: conjunctiva are pink and non-injected, sclera anicteric OROPHARYNX: MMM, no exudates, no oropharyngeal erythema or ulceration NECK: supple, no JVD LYMPH:  no palpable lymphadenopathy in the cervical, axillary or inguinal regions LUNGS: clear to auscultation b/l with normal respiratory effort HEART: regular rate & rhythm ABDOMEN:  normoactive bowel sounds , non tender, not  distended. No palpable hepatosplenomegaly.  Extremity: no pedal edema PSYCH: alert & oriented x 3 with fluent speech NEURO: no focal motor/sensory deficits  LABORATORY DATA:  I have reviewed the data as listed  . CBC Latest Ref Rng & Units 06/20/2019 04/11/2019 03/17/2019  WBC 4.0 - 10.5 K/uL 6.0 12.9(H) 6.8  Hemoglobin 13.0 - 17.0 g/dL 15.6 14.1 8.5(L)  Hematocrit 39.0 - 52.0 % 48.2 44.3 26.4(L)  Platelets 150 - 400 K/uL 240 348 278    . CMP Latest Ref Rng & Units 06/20/2019 04/11/2019 03/17/2019  Glucose 70 - 99 mg/dL 113(H) 92 91  BUN 8 - 23 mg/dL 15 13 12   Creatinine 0.61 - 1.24 mg/dL 0.93 0.84 0.68  Sodium 135 - 145 mmol/L 140 140 139  Potassium 3.5 - 5.1 mmol/L 4.1 4.3 3.9  Chloride 98 - 111 mmol/L 103 104 109  CO2 22 - 32 mmol/L 28 27 25   Calcium 8.9 - 10.3 mg/dL 9.5 9.9 8.3(L)  Total Protein 6.5 - 8.1 g/dL 7.6 7.7 -  Total Bilirubin 0.3 - 1.2 mg/dL 0.3 0.4 -  Alkaline Phos 38 - 126 U/L 66 62 -  AST 15 - 41 U/L 33 24 -  ALT 0 - 44 U/L 43 53(H) -     RADIOGRAPHIC STUDIES: I have personally reviewed the radiological images as listed and agreed with the findings in the report. US Venous Img Lower Bilateral  Result Date: 05/22/2019 CLINICAL DATA:  Bilateral calf DVT. EXAM: BILATERAL LOWER EXTREMITY VENOUS DOPPLER ULTRASOUND TECHNIQUE: Gray-scale sonography with graded compression, as well as color Doppler and duplex ultrasound were performed to evaluate the lower extremity deep venous systems from the level of the common femoral vein and including the common femoral, femoral, profunda femoral, popliteal and calf veins including the posterior tibial, peroneal and gastrocnemius veins when visible. The superficial great saphenous vein was also interrogated. Spectral Doppler was utilized to evaluate flow at rest and with distal augmentation maneuvers in the common femoral, femoral and popliteal veins. COMPARISON:  None. FINDINGS: RIGHT LOWER EXTREMITY Common Femoral Vein: No evidence  of thrombus. Normal compressibility, respiratory phasicity and response to augmentation. Saphenofemoral Junction: No evidence of thrombus. Normal compressibility and flow on color Doppler imaging. Profunda Femoral Vein: No evidence of thrombus. Femoral Vein: No evidence of thrombus. Normal compressibility, respiratory phasicity and response to augmentation. Popliteal Vein: No evidence of thrombus. Normal compressibility, respiratory phasicity and response to augmentation. Calf Veins: No evidence of thrombus. Normal compressibility and flow on color Doppler imaging. Superficial Great Saphenous Vein: No evidence of thrombus. Normal compressibility. Venous Reflux:  None. Other Findings:  None. LEFT LOWER EXTREMITY Common Femoral Vein: No evidence of thrombus. Normal compressibility, respiratory phasicity and response to augmentation. Saphenofemoral Junction: No evidence of thrombus. Normal compressibility and flow on color Doppler imaging. Profunda Femoral Vein: No evidence of thrombus. Normal compressibility and flow on color Doppler imaging. Femoral Vein: No evidence of thrombus. Normal compressibility, respiratory phasicity and response to augmentation. Popliteal Vein: No evidence of thrombus. Normal compressibility, respiratory phasicity and response to augmentation. Calf Veins: No evidence of thrombus. Normal compressibility and flow on color Doppler imaging. Superficial Great Saphenous Vein: No evidence of thrombus. Normal compressibility. Venous Reflux:  None. Other Findings:  None. IMPRESSION: No evidence of deep venous thrombosis in either lower extremity. The previously noted bilateral calf vein DVTs have resolved. Electronically Signed   By: Jacqulynn Cadet M.D.   On: 05/22/2019 17:14   Ir Radiologist  Eval & Mgmt  Result Date: 05/22/2019 Please refer to notes tab for details about interventional procedure. (Op Note)   ASSESSMENT & PLAN:   1) h/o Lower extremity DVT 2015 triggered by prostatectomy  - 03/08/2019 VAS [place] Lower Extremity Venous Left ([id number]) revealed "Right: No evidence of common femoral vein obstruction. Left: Findings consistent with acute deep vein thrombosis involving the left posterior tibial veins, and left peroneal veins." - 03/15/2019 VAS [place] Lower Extremity Venous Right ([id number]) which revealed "Right: Findings consistent with acute deep vein thrombosis involving the right posterior tibial veins." 2) Recent lower extremity DVT triggered by COVID 19 infection, pneumonia, high dose steroids. S/p IVC filter due to epistaxis 3) Epistaxis while on anticoagulation ? Etiology PLAN: -Discussed pt labwork today, 06/20/19; all values are WNL except for Glucose at 113. -Discussed 06/20/2019 Prothrombin Time at 13.5, INR at 1.0 -Discussed 06/20/2019 APTT at 35 -Discussed 06/20/2019 Fibrogen at 377 -Discussed 06/20/2019 Ferritin at 22 -Discussed 06/20/2019 Iron and TIBC is as follows: all values are WNL except for Sat Ratios at 17 -Discussed 05/22/2019 US Venous Lower Bilateral (DVT) ([id number]) "No evidence of deep venous thrombosis in either lower extremity. The previously noted bilateral calf vein DVTs have resolved." -Discussed that pt's DVT was provoked, non extensive, and not in a higher risk area -No indication for additional hypercoagulable workup at this time -Plan to continue 5 mg Eliquis BID for 6 months, with 81 mg of baby Asprin for a year to follow  -Recommended that the pt continue to eat well, drink at least 48-64 oz of water each day, and walk 20-30 minutes each day.  -Recommended pt not to cross his legs for long periods of time, move every hour with long-distance travel, minimize long distance travel, use compression socks, avoid significant alcohol use, stay close to ideal body weight to mitigate risk factors -Recommend pt continue to use sterile saline sprays, Vaseline inside of nasal lining, hot air humidifier at night and Afrin as needed -IVC filter  removal scheduled for 06/22/2019  -Will see back as needed   FOLLOW UP: RTC with Dr Irene Limbo as needed   The total time spent in the appt was 20 minutes and more than 50% was on counseling and direct patient cares.  All of the patient's questions were answered with apparent satisfaction. The patient knows to call the clinic with any problems, questions or concerns.   Sullivan Lone MD [place] AAHIVMS [facility] [address] Hematology/Oncology Physician [facility]  (Office):       [phone] (Work cell):  [phone] (Fax):           [phone]  06/20/2019 4:54 PM  I, Yevette Edwards, am acting as a scribe for Dr. Sullivan Lone.   .I have reviewed the above documentation for accuracy and completeness, and I agree with the above. Brunetta Genera MD

## 2019-06-21 ENCOUNTER — Telehealth: Payer: Self-pay | Admitting: Hematology

## 2019-06-21 NOTE — Telephone Encounter (Signed)
Per 12/2 los RTC with Dr Irene Limbo as needed

## 2019-06-22 ENCOUNTER — Other Ambulatory Visit: Payer: Self-pay

## 2019-06-22 ENCOUNTER — Ambulatory Visit (HOSPITAL_COMMUNITY)
Admission: RE | Admit: 2019-06-22 | Discharge: 2019-06-22 | Disposition: A | Discharge status: Home / Self Care | Payer: BC Managed Care – PPO | Source: Ambulatory Visit | Attending: Interventional Radiology | Admitting: Interventional Radiology

## 2019-06-22 ENCOUNTER — Encounter (HOSPITAL_COMMUNITY): Payer: Self-pay | Admitting: Interventional Radiology

## 2019-06-22 DIAGNOSIS — Z7901 Long term (current) use of anticoagulants: Secondary | ICD-10-CM | POA: Diagnosis not present

## 2019-06-22 DIAGNOSIS — Z452 Encounter for adjustment and management of vascular access device: Secondary | ICD-10-CM | POA: Diagnosis present

## 2019-06-22 DIAGNOSIS — Z79899 Other long term (current) drug therapy: Secondary | ICD-10-CM | POA: Diagnosis not present

## 2019-06-22 DIAGNOSIS — Z86718 Personal history of other venous thrombosis and embolism: Secondary | ICD-10-CM | POA: Insufficient documentation

## 2019-06-22 DIAGNOSIS — Z95828 Presence of other vascular implants and grafts: Secondary | ICD-10-CM

## 2019-06-22 HISTORY — PX: IR IVC FILTER RETRIEVAL / S&I /IMG GUID/MOD SED: IMG5308

## 2019-06-22 LAB — CBC WITH DIFFERENTIAL/PLATELET
Abs Immature Granulocytes: 0.02 10*3/uL (ref 0.00–0.07)
Basophils Absolute: 0.1 10*3/uL (ref 0.0–0.1)
Basophils Relative: 1 %
Eosinophils Absolute: 0.2 10*3/uL (ref 0.0–0.5)
Eosinophils Relative: 3 %
HCT: 48 % (ref 39.0–52.0)
Hemoglobin: 15.6 g/dL (ref 13.0–17.0)
Immature Granulocytes: 0 %
Lymphocytes Relative: 28 %
Lymphs Abs: 1.7 10*3/uL (ref 0.7–4.0)
MCH: 28.9 pg (ref 26.0–34.0)
MCHC: 32.5 g/dL (ref 30.0–36.0)
MCV: 88.9 fL (ref 80.0–100.0)
Monocytes Absolute: 0.8 10*3/uL (ref 0.1–1.0)
Monocytes Relative: 13 %
Neutro Abs: 3.3 10*3/uL (ref 1.7–7.7)
Neutrophils Relative %: 55 %
Platelets: 254 10*3/uL (ref 150–400)
RBC: 5.4 MIL/uL (ref 4.22–5.81)
RDW: 13.3 % (ref 11.5–15.5)
WBC: 6 10*3/uL (ref 4.0–10.5)
nRBC: 0 % (ref 0.0–0.2)

## 2019-06-22 LAB — BASIC METABOLIC PANEL
Anion gap: 10 (ref 5–15)
BUN: 13 mg/dL (ref 8–23)
CO2: 25 mmol/L (ref 22–32)
Calcium: 9.4 mg/dL (ref 8.9–10.3)
Chloride: 103 mmol/L (ref 98–111)
Creatinine, Ser: 0.82 mg/dL (ref 0.61–1.24)
GFR calc Af Amer: >60 mL/min (ref >60)
GFR calc non Af Amer: >60 mL/min (ref >60)
Glucose, Bld: 100 mg/dL — ABNORMAL HIGH (ref 70–99)
Potassium: 4.7 mmol/L (ref 3.5–5.1)
Sodium: 138 mmol/L (ref 135–145)

## 2019-06-22 MED ORDER — MIDAZOLAM HCL 2 MG/2ML IJ SOLN
INTRAMUSCULAR | Status: AC | PRN
  Administered 2019-06-22 (×2): 1 mg via INTRAVENOUS

## 2019-06-22 MED ORDER — IOHEXOL 300 MG/ML  SOLN
100.0000 mL | Freq: Once | INTRAMUSCULAR | Status: AC | PRN
  Administered 2019-06-22: 20 mL via INTRAVENOUS

## 2019-06-22 MED ORDER — FENTANYL CITRATE (PF) 100 MCG/2ML IJ SOLN
INTRAMUSCULAR | Status: AC
  Filled 2019-06-22: qty 2

## 2019-06-22 MED ORDER — MIDAZOLAM HCL 2 MG/2ML IJ SOLN
INTRAMUSCULAR | Status: AC
  Filled 2019-06-22: qty 2

## 2019-06-22 MED ORDER — LIDOCAINE HCL 1 % IJ SOLN
INTRAMUSCULAR | Status: AC | PRN
  Administered 2019-06-22: 5 mL

## 2019-06-22 MED ORDER — FENTANYL CITRATE (PF) 100 MCG/2ML IJ SOLN
INTRAMUSCULAR | Status: AC | PRN
  Administered 2019-06-22 (×2): 50 ug via INTRAVENOUS

## 2019-06-22 MED ORDER — LIDOCAINE HCL 1 % IJ SOLN
INTRAMUSCULAR | Status: AC
  Filled 2019-06-22: qty 20

## 2019-06-22 MED ORDER — SODIUM CHLORIDE 0.9 % IV SOLN
INTRAVENOUS | Status: AC | PRN
  Administered 2019-06-22: 75 mL/h via INTRAVENOUS

## 2019-06-22 MED ORDER — SODIUM CHLORIDE 0.9 % IV SOLN
INTRAVENOUS | Status: DC
  Administered 2019-06-22: 08:00:00 via INTRAVENOUS

## 2019-06-22 NOTE — Procedures (Signed)
Interventional Radiology Procedure Note  Procedure: IVC filter retrieval  Complications: None  Estimated Blood Loss: None  Recommendations: - Bedrest x 2 hrs - DC home    Signed,  Eloisa Chokshi K. Lyssa Hackley, MD    

## 2019-06-22 NOTE — H&P (Signed)
Chief Complaint: UVC filter No longer needed  Referring Physician(s): [place]  Supervising Physician: Jacqulynn Cadet  Patient Status: [facility] - Out-pt  History of Present Illness: Ronald Arnold is a 63 y.o. male  History of Epistaxis (hgb of 7.3) and right lower extremity DVT. IR placed an [place] IVC filter on 8.28.20. Patient is on eliquis bid with no complications. Patient presents for IVC filter removal  Past Medical History:  Diagnosis Date  . Cancer [facility])    prostate cancer   . COVID-19 virus infection   . Numbness     Past Surgical History:  Procedure Laterality Date  . IR IVC FILTER PLMT / S&I /IMG GUID/MOD SED  03/16/2019  . IR RADIOLOGIST EVAL & MGMT  05/22/2019  . LYMPHADENECTOMY Bilateral 06/20/2014   Procedure: LYMPHADENECTOMY;  Surgeon: Raynelle Bring, MD;  Location: [facility];  Service: Urology;  Laterality: Bilateral;  . ROBOT ASSISTED LAPAROSCOPIC RADICAL PROSTATECTOMY N/A 06/20/2014   Procedure: ROBOTIC ASSISTED LAPAROSCOPIC RADICAL PROSTATECTOMY LEVEL 2;  Surgeon: Raynelle Bring, MD;  Location: [facility];  Service: Urology;  Laterality: N/A;  . TONSILLECTOMY     to correct sleep apnea     Allergies: Sulfa antibiotics  Medications: Prior to Admission medications   Medication Sig Start Date End Date Taking? Authorizing Provider  apixaban (ELIQUIS) 5 MG TABS tablet Take 1 tablet (5 mg total) by mouth 2 (two) times daily. 06/04/19  Yes Brunetta Genera, MD  Ascorbic Acid (VITAMIN C) 1000 MG tablet Take 1,000 mg by mouth daily.   Yes [provider]  Cholecalciferol (VITAMIN D-3) 25 MCG (1000 UT) CAPS Take 2,000 Units by mouth daily.   Yes [provider]  fexofenadine (ALLEGRA) 180 MG tablet Take 180 mg by mouth daily.   Yes [provider]  fluticasone (FLONASE) 50 MCG/ACT nasal spray Place 1 spray into both nostrils daily.    Yes [provider]  L-Methylfolate-Algae-B12-B6 Glade Stanford) 3-90.314-2-35 MG CAPS Take  1 capsule by mouth 2 (two) times daily. 05/29/19  Yes Marcial Pacas, MD  Multiple Vitamin (MULTIVITAMIN WITH MINERALS) TABS tablet Take 1 tablet by mouth daily.   Yes [provider]  rosuvastatin (CRESTOR) 5 MG tablet Take 5 mg by mouth daily. 12/19/18  Yes [provider]  zinc gluconate 50 MG tablet Take 50 mg by mouth daily.   Yes [provider]     Family History  Problem Relation Age of Onset  . Diabetes Mother        died at age 71  . Hypertension Mother   . Other Father        enlarged prostate - died at age 36    Social History   Socioeconomic History  . Marital status: Married    Spouse name: Not on file  . Number of children: 3  . Years of education: college  . Highest education level: Doctorate  Occupational History  . Occupation: Estate manager/land agent professor  Social Needs  . Financial resource strain: Not on file  . Food insecurity    Worry: Not on file    Inability: Not on file  . Transportation needs    Medical: Not on file    Non-medical: Not on file  Tobacco Use  . Smoking status: Never Smoker  . Smokeless tobacco: Never Used  Substance and Sexual Activity  . Alcohol use: Yes    Comment: occasional beer or wine   . Drug use: No  . Sexual activity: Not on file  Lifestyle  .  Physical activity    Days per week: Not on file    Minutes per session: Not on file  . Stress: Not on file  Relationships  . Social Herbalist on phone: Not on file    Gets together: Not on file    Attends religious service: Not on file    Active member of club or organization: Not on file    Attends meetings of clubs or organizations: Not on file    Relationship status: Not on file  Other Topics Concern  . Not on file  Social History Narrative   Lives at home with his wife.   1-2 cups per day.   Right-handed.     Review of Systems: A 12 point ROS discussed and pertinent positives are indicated in the HPI above.  All other systems are negative.   Review of Systems  Vital Signs: BP (!) 141/102   Pulse 76   Temp (!) 97.3 F (36.3 C) (Skin)   Resp 16   Ht 5\' 10"  (1.778 m)   Wt 215 lb (97.5 kg)   SpO2 99%   BMI 30.85 kg/m   Physical Exam  Imaging: No results found.  Labs:  CBC: Recent Labs    03/17/19 0405 04/11/19 1155 06/20/19 1322 06/22/19 0730  WBC 6.8 12.9* 6.0 6.0  HGB 8.5* 14.1 15.6 15.6  HCT 26.4* 44.3 48.2 48.0  PLT 278 348 240 254    COAGS: Recent Labs    03/16/19 0351 04/11/19 1154 06/20/19 1321  INR 1.1 1.0 1.0  APTT  --  31 35    BMP: Recent Labs    03/16/19 0753 03/17/19 0405 04/11/19 1155 06/20/19 1322  NA 141 139 140 140  K 4.3 3.9 4.3 4.1  CL 108 109 104 103  CO2 26 25 27 28   GLUCOSE 96 91 92 113*  BUN 12 12 13 15   CALCIUM 8.4* 8.3* 9.9 9.5  CREATININE 0.67 0.68 0.84 0.93  GFRNONAA >60 >60 >60 >60  GFRAA >60 >60 >60 >60    LIVER FUNCTION TESTS: Recent Labs    03/03/19 0050 03/15/19 0336 04/11/19 1155 06/20/19 1322  BILITOT 0.9 0.4 0.4 0.3  AST 29 19 24  33  ALT 85* 32 53* 43  ALKPHOS 47 44 62 66  PROT 6.5 5.8* 7.7 7.6  ALBUMIN 3.3* 2.6* 4.3 4.3    TUMOR MARKERS: No results for input(s): AFPTM, CEA, CA199, CHROMGRNA in the last 8760 hours.  Assessment and Plan: 64 y.,o. male. History of Epistaxis (hgb of 7.3) and right lower extremity DVT. IR placed an [place] IVC filter on 8.28.20. Patient is on eliquis bid with no complications. Patient presents for IVC filter removal  Pertinent imaging  11.3.20 - Venous doppler shows DVT resolved 8.28.20 - IVC filter placement   Risks and benefits discussed with the patient including, but not limited to bleeding, infection, filter fracture or migration which can lead to emergency surgery or even death, strut penetration with damage or irritation to adjacent structures and caval thrombosis.  All of the patient's questions were answered, patient is agreeable to proceed.  Consent signed and in chart.   Thank you for  this interesting consult.  I greatly enjoyed meeting Ronald Arnold and look forward to participating in their care.  A copy of this report was sent to the requesting provider on this date.  Electronically Signed: Avel Peace, NP 06/22/2019, 8:31 AM   I spent a total of  10 Minutes in face to face in clinical consultation, greater than 50% of which was counseling/coordinating care for IVC filter removal

## 2019-06-22 NOTE — Progress Notes (Signed)
[place] notified client took eliquis this am. No new orders

## 2019-06-22 NOTE — Discharge Instructions (Signed)
Inferior Vena Cava Filter Removal, Care After This sheet gives you information about how to care for yourself after your procedure. Your health care provider may also give you more specific instructions. If you have problems or questions, contact your health care provider. What can I expect after the procedure? After the procedure, it is common to have:  Mild pain and bruising around your incision in your neck or groin.  Fatigue. Follow these instructions at home: Incision care   Follow instructions from your health care provider about how to take care of your incision. Make sure you: ? Wash your hands with soap and water before you change your bandage (dressing). If soap and water are not available, use hand sanitizer. ? Change your dressing as told by your health care provider.  Check your incision area every day for signs of infection. Check for: ? Redness, swelling, or more pain. ? Fluid or blood. ? Warmth. ? Pus or a bad smell. General instructions  Take over-the-counter and prescription medicines only as told by your health care provider.  Do not take baths, swim, or use a hot tub until your health care provider approves. Ask your health care provider if you may take showers. You may only be allowed to take sponge baths.  Do not drive for 24 hours if you were given a medicine to help you relax (sedative) during your procedure.  Return to your normal activities as told by your health care provider. Ask your health care provider what activities are safe for you.  Keep all follow-up visits as told by your health care provider. This is important. Contact a health care provider if:  You have chills or a fever.  You have redness, swelling, or more pain around your incision.  Your incision feels warm to the touch.  You have pus or a bad smell coming from your incision. Get help right away if:  You have blood coming from your incision (active bleeding). ? If you have  bleeding from the incision site, lie down, apply pressure to the area with a clean cloth or gauze, and get help right away.  You have chest pain.  You have difficulty breathing. Summary  Follow instructions from your health care provider about how to take care of your incision.  Return to your normal activities as told by your health care provider.  Check your incision area every day for signs of infection.  Get help right away if you have active bleeding, chest pain, or trouble breathing. This information is not intended to replace advice given to you by your health care provider. Make sure you discuss any questions you have with your health care provider. Document Released: 01/13/2017 Document Revised: 06/17/2017 Document Reviewed: 01/13/2017 Elsevier Patient Education  [place]. Moderate Conscious Sedation, Adult, Care After These instructions provide you with information about caring for yourself after your procedure. Your health care provider may also give you more specific instructions. Your treatment has been planned according to current medical practices, but problems sometimes occur. Call your health care provider if you have any problems or questions after your procedure. What can I expect after the procedure? After your procedure, it is common:  To feel sleepy for several hours.  To feel clumsy and have poor balance for several hours.  To have poor judgment for several hours.  To vomit if you eat too soon. Follow these instructions at home: For at least 24 hours after the procedure:   Do not: ?  Participate in activities where you could fall or become injured. ? Drive. ? Use heavy machinery. ? Drink alcohol. ? Take sleeping pills or medicines that cause drowsiness. ? Make important decisions or sign legal documents. ? Take care of children on your own.  Rest. Eating and drinking  Follow the diet recommended by your health care provider.  If you  vomit: ? Drink water, juice, or soup when you can drink without vomiting. ? Make sure you have little or no nausea before eating solid foods. General instructions  Have a responsible adult stay with you until you are awake and alert.  Take over-the-counter and prescription medicines only as told by your health care provider.  If you smoke, do not smoke without supervision.  Keep all follow-up visits as told by your health care provider. This is important. Contact a health care provider if:  You keep feeling nauseous or you keep vomiting.  You feel light-headed.  You develop a rash.  You have a fever. Get help right away if:  You have trouble breathing. This information is not intended to replace advice given to you by your health care provider. Make sure you discuss any questions you have with your health care provider. Document Released: 04/25/2013 Document Revised: 06/17/2017 Document Reviewed: 10/25/2015 Elsevier Patient Education  2020 [employer].

## 2019-06-26 ENCOUNTER — Ambulatory Visit (HOSPITAL_COMMUNITY): Payer: BC Managed Care – PPO

## 2019-08-27 ENCOUNTER — Other Ambulatory Visit: Payer: Self-pay | Admitting: Hematology

## 2019-09-23 ENCOUNTER — Ambulatory Visit: Payer: BC Managed Care – PPO | Attending: Internal Medicine

## 2019-09-23 DIAGNOSIS — Z23 Encounter for immunization: Secondary | ICD-10-CM

## 2019-09-23 NOTE — Progress Notes (Signed)
   Covid-19 Vaccination Clinic  Name:  Ronald Arnold    MRN: [id number] DOB: 10-17-1955  09/23/2019  [name] was observed post Covid-19 immunization for 15 minutes without incident. He was provided with Vaccine Information Sheet and instruction to access the V-Safe system.   [name] was instructed to call 911 with any severe reactions post vaccine: Marland Kitchen Difficulty breathing  . Swelling of face and throat  . A fast heartbeat  . A bad rash all over body  . Dizziness and weakness   Immunizations Administered    Name Date Dose VIS Date Route   Pfizer COVID-19 Vaccine 09/23/2019  4:03 PM 0.3 mL 06/29/2019 Intramuscular   Manufacturer: [place]   Lot: [id number]   [place]: [id number]

## 2019-10-23 ENCOUNTER — Ambulatory Visit: Payer: BC Managed Care – PPO | Attending: Internal Medicine

## 2019-10-23 DIAGNOSIS — Z23 Encounter for immunization: Secondary | ICD-10-CM

## 2019-10-23 NOTE — Progress Notes (Signed)
   Covid-19 Vaccination Clinic  Name:  Ronald Arnold    MRN: [id number] DOB: Oct 17, 1955  10/23/2019  Mr. Ronald Arnold was observed post Covid-19 immunization for 15 minutes without incident. He was provided with Vaccine Information Sheet and instruction to access the V-Safe system.   Mr. Ronald Arnold was instructed to call 911 with any severe reactions post vaccine: Marland Kitchen Difficulty breathing  . Swelling of face and throat  . A fast heartbeat  . A bad rash all over body  . Dizziness and weakness   Immunizations Administered    Name Date Dose VIS Date Route   Pfizer COVID-19 Vaccine 10/23/2019  2:20 PM 0.3 mL 06/29/2019 Intramuscular   Manufacturer: [place]   Lot: [id number]   [place]: [id number]

## 2019-11-30 ENCOUNTER — Other Ambulatory Visit: Payer: Self-pay | Admitting: Hematology

## 2019-11-30 ENCOUNTER — Telehealth: Payer: Self-pay | Admitting: Hematology

## 2019-11-30 NOTE — Telephone Encounter (Signed)
Scheduled per 5/13 sch msg. Unable to reach pt. Left voicemail- appts on 6/14.

## 2019-12-28 ENCOUNTER — Other Ambulatory Visit: Payer: Self-pay | Admitting: *Deleted

## 2019-12-28 DIAGNOSIS — I824Z3 Acute embolism and thrombosis of unspecified deep veins of distal lower extremity, bilateral: Secondary | ICD-10-CM

## 2019-12-31 ENCOUNTER — Inpatient Hospital Stay: Payer: BC Managed Care – PPO

## 2019-12-31 ENCOUNTER — Other Ambulatory Visit: Payer: Self-pay

## 2019-12-31 ENCOUNTER — Telehealth: Payer: Self-pay | Admitting: Hematology

## 2019-12-31 ENCOUNTER — Inpatient Hospital Stay: Payer: BC Managed Care – PPO | Attending: Hematology | Admitting: Hematology

## 2019-12-31 VITALS — BP 143/90 | HR 71 | Temp 97.7°F | Resp 17 | Ht 70.0 in | Wt 218.9 lb

## 2019-12-31 DIAGNOSIS — Z8616 Personal history of COVID-19: Secondary | ICD-10-CM | POA: Diagnosis not present

## 2019-12-31 DIAGNOSIS — I824Z3 Acute embolism and thrombosis of unspecified deep veins of distal lower extremity, bilateral: Secondary | ICD-10-CM

## 2019-12-31 DIAGNOSIS — I82403 Acute embolism and thrombosis of unspecified deep veins of lower extremity, bilateral: Secondary | ICD-10-CM | POA: Insufficient documentation

## 2019-12-31 DIAGNOSIS — Z86718 Personal history of other venous thrombosis and embolism: Secondary | ICD-10-CM | POA: Insufficient documentation

## 2019-12-31 DIAGNOSIS — Z7982 Long term (current) use of aspirin: Secondary | ICD-10-CM | POA: Diagnosis not present

## 2019-12-31 DIAGNOSIS — Z8546 Personal history of malignant neoplasm of prostate: Secondary | ICD-10-CM | POA: Insufficient documentation

## 2019-12-31 LAB — CBC WITH DIFFERENTIAL (CANCER CENTER ONLY)
Abs Immature Granulocytes: 0.01 10*3/uL (ref 0.00–0.07)
Basophils Absolute: 0.1 10*3/uL (ref 0.0–0.1)
Basophils Relative: 1 %
Eosinophils Absolute: 0.2 10*3/uL (ref 0.0–0.5)
Eosinophils Relative: 2 %
HCT: 47.9 % (ref 39.0–52.0)
Hemoglobin: 16.1 g/dL (ref 13.0–17.0)
Immature Granulocytes: 0 %
Lymphocytes Relative: 32 %
Lymphs Abs: 2.2 10*3/uL (ref 0.7–4.0)
MCH: 31.3 pg (ref 26.0–34.0)
MCHC: 33.6 g/dL (ref 30.0–36.0)
MCV: 93.2 fL (ref 80.0–100.0)
Monocytes Absolute: 0.7 10*3/uL (ref 0.1–1.0)
Monocytes Relative: 11 %
Neutro Abs: 3.6 10*3/uL (ref 1.7–7.7)
Neutrophils Relative %: 54 %
Platelet Count: 243 10*3/uL (ref 150–400)
RBC: 5.14 MIL/uL (ref 4.22–5.81)
RDW: 12.9 % (ref 11.5–15.5)
WBC Count: 6.7 10*3/uL (ref 4.0–10.5)
nRBC: 0 % (ref 0.0–0.2)

## 2019-12-31 LAB — CMP (CANCER CENTER ONLY)
ALT: 31 U/L (ref 0–44)
AST: 27 U/L (ref 15–41)
Albumin: 4.2 g/dL (ref 3.5–5.0)
Alkaline Phosphatase: 55 U/L (ref 38–126)
Anion gap: 9 (ref 5–15)
BUN: 13 mg/dL (ref 8–23)
CO2: 29 mmol/L (ref 22–32)
Calcium: 9.8 mg/dL (ref 8.9–10.3)
Chloride: 103 mmol/L (ref 98–111)
Creatinine: 0.94 mg/dL (ref 0.61–1.24)
GFR, Est AFR Am: >60 mL/min (ref >60)
GFR, Estimated: >60 mL/min (ref >60)
Glucose, Bld: 104 mg/dL — ABNORMAL HIGH (ref 70–99)
Potassium: 4.8 mmol/L (ref 3.5–5.1)
Sodium: 141 mmol/L (ref 135–145)
Total Bilirubin: 0.5 mg/dL (ref 0.3–1.2)
Total Protein: 7.7 g/dL (ref 6.5–8.1)

## 2019-12-31 LAB — D-DIMER, QUANTITATIVE: D-Dimer, Quant: <0.27 ug/mL-FEU (ref 0.00–0.50)

## 2019-12-31 LAB — ANTITHROMBIN III: AntiThromb III Func: 103 % (ref 75–120)

## 2019-12-31 NOTE — Progress Notes (Signed)
HEMATOLOGY/ONCOLOGY CLINIC NOTE  Date of Service: 12/31/2019  Patient Care Team: Lawerance Cruel, MD as PCP - General (Family Medicine)  CHIEF COMPLAINTS/PURPOSE OF CONSULTATION:  DVT  HISTORY OF PRESENTING ILLNESS:   Ronald Arnold is a wonderful 64 y.o. male who has been referred to [place] by Dr Lindi Adie for evaluation and management of DVT. We are joined today by his wife Jan, via phone. The pt reports that he is doing well overall.  In 2015 the pt had a Prostatectomy and was not placed on blood thinners directly after. About 2 weeks later he had a DVT in his left calf. He was then put on Xarelto 20 mg once a day for about 3 months and had no issues with that dose. On 02/17/2019 pt presented to the hospital for pneumonia secondary to COVID-19 viral infection. Pt was placed on Dexamethasone for his COVID19 infection but was not on asprin. On 03/08/2019 it was discovered that pt had another left lower extremity DVT. He was once again placed on Xarelto. A few days later he woke up and saw that he had a bleed all over his pillow due to a severe nosebleed. He continued to experience episodes of epistaxis over the next couple of days and was able to control them with pressure. Around dinner time on 03/12/2019 he had an episode that he was unable to stop so he went to the ER. They gave him an Afrin soaked cloth but did no chemical or surgical cauterization. Pt then began vomiting swallowed blood. The last time that he can remember having a nosebleed before was in 07/2018. He reports that his nosebleeds typically occur about once a year. Pt has no history of bleeding in other areas. The pt reports that he has not been seen by an ENT physician and no one has ever checked his nose to discover the cause of his nosebleeds. A right lower extremity DVT was discovered on 03/15/2019. On 03/16/2019 he was given an IVC Filter. They stopped his blood thinners before the procedure but restarted him after. He was on  blood thinners for less than a week and is currently off of them. He reports a knot in his left arm. He also notices that his last two digits of his left hand have been numb as well as a loss of sensation on the underside of his left arm since this procedure.   Pt has no other known medical problems. He has no family history of blood clots or other bleeding disorders. Today was his last day of his Prednisone course.  Pt is currently taking iron, folic acid, Vitamin C, Vitamin B12. Pt's stools are currently black due to the iron. He has been walking about a mile a day lately.    Of note prior to the patient's visit today, pt has had VAS [place] Lower Extremity Venous Left ([id number]) completed on 03/08/2019 with results revealing "Right: No evidence of common femoral vein obstruction. Left: Findings consistent with acute deep vein thrombosis involving the left posterior tibial veins, and left peroneal veins."   Pt also had VAS [place] Lower Extremity Venous Right ([id number]) completed on 03/15/2019 with results revealing "Right: Findings consistent with acute deep vein thrombosis involving the right posterior tibial veins."  Most recent lab results (03/17/2019) of CBC is as follows: all values are WNL except for RBC at 2.73, Hgb at 8.5, HCT at 26.4, RDW at 16.3, Calcium at 8.3.  On review of systems, pt reports finger numbness  in his left hand, left arm numbness/knot and denies bloody stools, abdominal pain, leg swelling, calf pain and any other symptoms.   On PMHx the pt reports DVT (both legs), COVID19 infection, Prostate Cancer, Prostatectomy (2015).  INTERVAL HISTORY:   Ronald Arnold is a wonderful 64 y.o. male who is here for evaluation and management of DVT. The patient's last visit with [place] was on 06/19/2020. The pt reports that he is doing well overall.  The pt reports that he has not had any nose bleeds since our last visit. Pt had his IVC filter removed and had no complications. He denies any new  concerns.   Lab results today (12/31/19) of CBC w/diff and CMP is as follows: all values are WNL except for Glucose at 104.  On review of systems, pt denies fevers, chills, unexpected weight loss, leg pain and any other symptoms.    MEDICAL HISTORY:  Past Medical History:  Diagnosis Date  . Cancer [facility])    prostate cancer   . COVID-19 virus infection   . Numbness     SURGICAL HISTORY: Past Surgical History:  Procedure Laterality Date  . IR IVC FILTER PLMT / S&I /IMG GUID/MOD SED  03/16/2019  . IR IVC FILTER RETRIEVAL / S&I /IMG GUID/MOD SED  06/22/2019  . IR RADIOLOGIST EVAL & MGMT  05/22/2019  . LYMPHADENECTOMY Bilateral 06/20/2014   Procedure: LYMPHADENECTOMY;  Surgeon: Raynelle Bring, MD;  Location: [facility];  Service: Urology;  Laterality: Bilateral;  . ROBOT ASSISTED LAPAROSCOPIC RADICAL PROSTATECTOMY N/A 06/20/2014   Procedure: ROBOTIC ASSISTED LAPAROSCOPIC RADICAL PROSTATECTOMY LEVEL 2;  Surgeon: Raynelle Bring, MD;  Location: [facility];  Service: Urology;  Laterality: N/A;  . TONSILLECTOMY     to correct sleep apnea     SOCIAL HISTORY: Social History   Socioeconomic History  . Marital status: Married    Spouse name: Not on file  . Number of children: 3  . Years of education: college  . Highest education level: Doctorate  Occupational History  . Occupation: Estate manager/land agent professor  Tobacco Use  . Smoking status: Never Smoker  . Smokeless tobacco: Never Used  Substance and Sexual Activity  . Alcohol use: Yes    Comment: occasional beer or wine   . Drug use: No  . Sexual activity: Not on file  Other Topics Concern  . Not on file  Social History Narrative   Lives at home with his wife.   1-2 cups per day.   Right-handed.   Social Determinants of Health   Financial Resource Strain:   . Difficulty of Paying Living Expenses:   Food Insecurity:   . Worried About Charity fundraiser in the Last Year:   . Arboriculturist in the Last Year:   Transportation Needs:   . Consulting civil engineer (Medical):   Marland Kitchen Lack of Transportation (Non-Medical):   Physical Activity:   . Days of Exercise per Week:   . Minutes of Exercise per Session:   Stress:   . Feeling of Stress :   Social Connections:   . Frequency of Communication with Friends and Family:   . Frequency of Social Gatherings with Friends and Family:   . Attends Religious Services:   . Active Member of Clubs or Organizations:   . Attends Archivist Meetings:   Marland Kitchen Marital Status:   Intimate Partner Violence:   . Fear of Current or Ex-Partner:   . Emotionally Abused:   Marland Kitchen Physically Abused:   .  Sexually Abused:     FAMILY HISTORY: Family History  Problem Relation Age of Onset  . Diabetes Mother        died at age 89  . Hypertension Mother   . Other Father        enlarged prostate - died at age 45    ALLERGIES:  is allergic to sulfa antibiotics.  MEDICATIONS:  Current Outpatient Medications  Medication Sig Dispense Refill  . Ascorbic Acid (VITAMIN C) 1000 MG tablet Take 1,000 mg by mouth daily.    . Cholecalciferol (VITAMIN D-3) 25 MCG (1000 UT) CAPS Take 2,000 Units by mouth daily.    Marland Kitchen ELIQUIS 5 MG TABS tablet TAKE 1 TABLET BY MOUTH TWICE A DAY 60 tablet 2  . fexofenadine (ALLEGRA) 180 MG tablet Take 180 mg by mouth daily.    . fluticasone (FLONASE) 50 MCG/ACT nasal spray Place 1 spray into both nostrils daily.     Marland Kitchen L-Methylfolate-Algae-B12-B6 (METANX) 3-90.314-2-35 MG CAPS Take 1 capsule by mouth 2 (two) times daily. 60 capsule 11  . Multiple Vitamin (MULTIVITAMIN WITH MINERALS) TABS tablet Take 1 tablet by mouth daily.    . rosuvastatin (CRESTOR) 5 MG tablet Take 5 mg by mouth daily.    Marland Kitchen zinc gluconate 50 MG tablet Take 50 mg by mouth daily.     No current facility-administered medications for this visit.    REVIEW OF SYSTEMS:   A 10+ POINT REVIEW OF SYSTEMS WAS OBTAINED including neurology, dermatology, psychiatry, cardiac, respiratory, lymph, extremities, GI, GU,  Musculoskeletal, constitutional, breasts, reproductive, HEENT.  All pertinent positives are noted in the HPI.  All others are negative.   PHYSICAL EXAMINATION: ECOG PERFORMANCE STATUS: 1 - Symptomatic but completely ambulatory  . Vitals:   12/31/19 1032  BP: (!) 143/90  Pulse: 71  Resp: 17  Temp: 97.7 F (36.5 C)  SpO2: 98%   Filed Weights   12/31/19 1032  Weight: 218 lb 14.4 oz (99.3 kg)   .Body mass index is 31.41 kg/m.  GENERAL:alert, in no acute distress and comfortable SKIN: no acute rashes, no significant lesions EYES: conjunctiva are pink and non-injected, sclera anicteric OROPHARYNX: MMM, no exudates, no oropharyngeal erythema or ulceration NECK: supple, no JVD LYMPH:  no palpable lymphadenopathy in the cervical, axillary or inguinal regions LUNGS: clear to auscultation b/l with normal respiratory effort HEART: regular rate & rhythm ABDOMEN:  normoactive bowel sounds , non tender, not distended. No palpable hepatosplenomegaly.  Extremity: no pedal edema PSYCH: alert & oriented x 3 with fluent speech NEURO: no focal motor/sensory deficits  LABORATORY DATA:  I have reviewed the data as listed  . CBC Latest Ref Rng & Units 12/31/2019 06/22/2019 06/20/2019  WBC 4.0 - 10.5 K/uL 6.7 6.0 6.0  Hemoglobin 13.0 - 17.0 g/dL 16.1 15.6 15.6  Hematocrit 39 - 52 % 47.9 48.0 48.2  Platelets 150 - 400 K/uL 243 254 240    . CMP Latest Ref Rng & Units 12/31/2019 06/22/2019 06/20/2019  Glucose 70 - 99 mg/dL 104(H) 100(H) 113(H)  BUN 8 - 23 mg/dL 13 13 15   Creatinine 0.61 - 1.24 mg/dL 0.94 0.82 0.93  Sodium 135 - 145 mmol/L 141 138 140  Potassium 3.5 - 5.1 mmol/L 4.8 4.7 4.1  Chloride 98 - 111 mmol/L 103 103 103  CO2 22 - 32 mmol/L 29 25 28   Calcium 8.9 - 10.3 mg/dL 9.8 9.4 9.5  Total Protein 6.5 - 8.1 g/dL 7.7 - 7.6  Total Bilirubin 0.3 - 1.2 mg/dL 0.5 -  0.3  Alkaline Phos 38 - 126 U/L 55 - 66  AST 15 - 41 U/L 27 - 33  ALT 0 - 44 U/L 31 - 43     RADIOGRAPHIC STUDIES: I  have personally reviewed the radiological images as listed and agreed with the findings in the report. No results found.  ASSESSMENT & PLAN:   1) h/o Lower extremity DVT 2015 triggered by prostatectomy - 03/08/2019 VAS [place] Lower Extremity Venous Left ([id number]) revealed "Right: No evidence of common femoral vein obstruction. Left: Findings consistent with acute deep vein thrombosis involving the left posterior tibial veins, and left peroneal veins." - 03/15/2019 VAS [place] Lower Extremity Venous Right ([id number]) which revealed "Right: Findings consistent with acute deep vein thrombosis involving the right posterior tibial veins." 2) Recent lower extremity DVT triggered by COVID 19 infection, pneumonia, high dose steroids. S/p IVC filter due to epistaxis 05/22/2019 US Venous Lower Bilateral (DVT) ([id number]) revealed "No evidence of deep venous thrombosis in either lower extremity. The previously noted bilateral calf vein DVTs have resolved." 3) Epistaxis while on anticoagulation ? Etiology PLAN: -Discussed pt labwork today, 12/31/19; blood counts and chemistries are nml -Advised pt that both episodes of DVT were provoked (surgery and COVID19 infection respectfully) -Advised pt that neither of his DVTs would be considered high risk clots based on their location -Last US Venous B/L showed no evidence of DVT   -Advised pt that continuing anticoagulation can minimize risk of recurrent blood clots, but is not strongly recommended due to minor risk reduction -Advised pt that there may be a role for prophylactic anticoagulation perioperatively or for long-distance travel -Recommend pt stay well hydrated, stay within standard weight ranges, avoid crossing legs, avoid long-distance travel, and wear sports-graded compression socks to help minimize risk of rpt clots.  -Advised pt that a full hypercoagulable w/u is not strongly indicated at this time due other explanations for DVTs, but patient elects to  pursue this. -Advised pt that we could get D-dimer levels today and recheck 2 months after being on Aspirin exclusively  -Recommend pt use 81 mg Aspirin for prophylaxis  -Will get additional labs today -Will see back in 7 weeks via phone, with labs 1 week prior   FOLLOW UP:  Additional labs today Labs in 6 weeks Phone visit with Dr Irene Limbo in 7 weeks  The total time spent in the appt was 30 minutes and more than 50% was on counseling and direct patient cares.  All of the patient's questions were answered with apparent satisfaction. The patient knows to call the clinic with any problems, questions or concerns.    Sullivan Lone MD [place] AAHIVMS [facility] Hematology/Oncology Physician [facility]  (Office):       [phone] (Work cell):  [phone] (Fax):           [phone]  12/31/2019 11:03 AM  I, Yevette Edwards, am acting as a scribe for Dr. Sullivan Lone.   .I have reviewed the above documentation for accuracy and completeness, and I agree with the above. Brunetta Genera MD

## 2019-12-31 NOTE — Telephone Encounter (Signed)
Scheduled per 6/14 los. Pt decline AVS and calendar. Mychart active.

## 2020-01-01 ENCOUNTER — Ambulatory Visit: Payer: BC Managed Care – PPO | Admitting: Dietician

## 2020-01-01 LAB — PROTEIN S PANEL
Protein S Activity: 115 % (ref 63–140)
Protein S Ag, Free: 129 % (ref 57–157)
Protein S Ag, Total: 99 % (ref 60–150)

## 2020-01-01 LAB — BETA-2-GLYCOPROTEIN I ABS, IGG/M/A
Beta-2 Glyco I IgG: <9 GPI IgG units (ref 0–20)
Beta-2-Glycoprotein I IgA: <9 GPI IgA units (ref 0–25)
Beta-2-Glycoprotein I IgM: <9 GPI IgM units (ref 0–32)

## 2020-01-01 LAB — CARDIOLIPIN ANTIBODIES, IGG, IGM, IGA
Anticardiolipin IgA: <9 APL U/mL (ref 0–11)
Anticardiolipin IgG: <9 GPL U/mL (ref 0–14)
Anticardiolipin IgM: <9 MPL U/mL (ref 0–12)

## 2020-01-01 LAB — PROTEIN C ACTIVITY: Protein C Activity: 131 % (ref 73–180)

## 2020-01-01 LAB — PROTEIN C, TOTAL: Protein C, Total: 119 % (ref 60–150)

## 2020-01-02 LAB — PROTHROMBIN GENE MUTATION

## 2020-01-07 LAB — FACTOR 5 LEIDEN

## 2020-02-01 ENCOUNTER — Encounter: Payer: Self-pay | Admitting: Dietician

## 2020-02-01 ENCOUNTER — Other Ambulatory Visit: Payer: Self-pay

## 2020-02-01 ENCOUNTER — Encounter: Payer: BC Managed Care – PPO | Attending: Family Medicine | Admitting: Dietician

## 2020-02-01 DIAGNOSIS — R7303 Prediabetes: Secondary | ICD-10-CM | POA: Diagnosis present

## 2020-02-01 NOTE — Progress Notes (Signed)
Medical Nutrition Therapy   Primary concerns today: prediabetes, weight management, heart health   Referral diagnoses: R73.03-prediabetes, E66.9-obesity, E78.2-mixed hyperlipidemia, I10-hypertension Preferred learning style: no preference indicated Learning readiness: change in progress   NUTRITION ASSESSMENT   Anthropometrics  Weight: 218.6 lbs Height: 69 in  BMI:  32.3 kg/m2   Clinical Medical Hx: prostate cancer, HTN, hypercholesterolemia, obesity, prediabetes Labs: A1c 5.9%  Lifestyle & Dietary Hx Patient and his wife were present for today's visit. They are originally from [place]. States he would like to better control his blood sugar to prevent diabetes and manage his weight. States his mom had diabetes.  Typical meal pattern is 3 meals plus a snack and dessert. Has always eaten breakfast and understands its importance. Packs lunch for work, does not eat out often. States he knows that portions are important and would like to work on this. Asked many good questions about types of sugar/sweeteners, diets, portion control, best sources of carbohydrates, etc. so we discussed these.  24-Hr Dietary Recall First Meal: oatmeal + 1 tsp Truvia + 2 Tbsp chia seeds + 1/2 banana + walnuts Snack: - Second Meal: low sugar Greek yogurt + dried cranberries + almonds + 2 graham cracker sandwiches w/ peanut butter Snack: nuts  Third Meal: meat + starch + vegetable  Snack: 1 cookie Beverages: coffee w/ 2 Tbsp half & half, water, skim milk, hot black tea  Estimated Energy Needs Calories: 2000-2200 Carbohydrate: 225-248g Protein: 125-138g Fat: 67-73g   NUTRITION DIAGNOSIS  Altered nutrition related laboratory value (Endeavor-2.2) related to prediabetes as evidenced by A1c of 5.9%.   NUTRITION INTERVENTION  Nutrition education (E-1) on the following topics:  . Balanced and mindful eating . Prediabetes . Weight management  Handouts Provided Include   MyPlate & Meal Ideas   Breakfast  Ideas  Types of Sugar  Learning Style & Readiness for Change Teaching method utilized: Visual & Auditory  Demonstrated degree of understanding via: Teach Back  Barriers to learning/adherence to lifestyle change: None Identified  Goals Established by Pt . Monitor portion sizes    MONITORING & EVALUATION Dietary intake, weekly physical activity, and goals PRN.  Next Steps  Patient is to contact [facility] with questions or to make follow up appointment as needed.

## 2020-02-12 ENCOUNTER — Other Ambulatory Visit: Payer: Self-pay

## 2020-02-12 ENCOUNTER — Inpatient Hospital Stay: Payer: BC Managed Care – PPO | Attending: Hematology

## 2020-02-12 DIAGNOSIS — Z86718 Personal history of other venous thrombosis and embolism: Secondary | ICD-10-CM | POA: Diagnosis not present

## 2020-02-12 DIAGNOSIS — Z8546 Personal history of malignant neoplasm of prostate: Secondary | ICD-10-CM | POA: Insufficient documentation

## 2020-02-12 DIAGNOSIS — I824Z3 Acute embolism and thrombosis of unspecified deep veins of distal lower extremity, bilateral: Secondary | ICD-10-CM

## 2020-02-12 DIAGNOSIS — I82403 Acute embolism and thrombosis of unspecified deep veins of lower extremity, bilateral: Secondary | ICD-10-CM | POA: Diagnosis present

## 2020-02-12 LAB — D-DIMER, QUANTITATIVE: D-Dimer, Quant: 0.33 ug/mL-FEU (ref 0.00–0.50)

## 2020-02-13 LAB — LUPUS ANTICOAGULANT PANEL
DRVVT: 33 s (ref 0.0–47.0)
PTT Lupus Anticoagulant: 34 s (ref 0.0–51.9)

## 2020-02-18 ENCOUNTER — Telehealth: Payer: Self-pay | Admitting: Hematology

## 2020-02-19 ENCOUNTER — Inpatient Hospital Stay: Payer: BC Managed Care – PPO | Attending: Hematology | Admitting: Hematology

## 2020-02-19 DIAGNOSIS — I824Z3 Acute embolism and thrombosis of unspecified deep veins of distal lower extremity, bilateral: Secondary | ICD-10-CM | POA: Diagnosis not present

## 2020-02-19 NOTE — Progress Notes (Signed)
HEMATOLOGY/ONCOLOGY CLINIC NOTE  Date of Service: 02/19/2020  Patient Care Team: Lawerance Cruel, MD as PCP - General (Family Medicine)  CHIEF COMPLAINTS/PURPOSE OF CONSULTATION:  DVT  HISTORY OF PRESENTING ILLNESS:   Llewelyn Sheaffer Koegel is a wonderful 64 y.o. male who has been referred to [place] by Dr Lindi Adie for evaluation and management of DVT. We are joined today by his wife Jan, via phone. The pt reports that he is doing well overall.  In 2015 the pt had a Prostatectomy and was not placed on blood thinners directly after. About 2 weeks later he had a DVT in his left calf. He was then put on Xarelto 20 mg once a day for about 3 months and had no issues with that dose. On 02/17/2019 pt presented to the hospital for pneumonia secondary to COVID-19 viral infection. Pt was placed on Dexamethasone for his COVID19 infection but was not on asprin. On 03/08/2019 it was discovered that pt had another left lower extremity DVT. He was once again placed on Xarelto. A few days later he woke up and saw that he had a bleed all over his pillow due to a severe nosebleed. He continued to experience episodes of epistaxis over the next couple of days and was able to control them with pressure. Around dinner time on 03/12/2019 he had an episode that he was unable to stop so he went to the ER. They gave him an Afrin soaked cloth but did no chemical or surgical cauterization. Pt then began vomiting swallowed blood. The last time that he can remember having a nosebleed before was in 07/2018. He reports that his nosebleeds typically occur about once a year. Pt has no history of bleeding in other areas. The pt reports that he has not been seen by an ENT physician and no one has ever checked his nose to discover the cause of his nosebleeds. A right lower extremity DVT was discovered on 03/15/2019. On 03/16/2019 he was given an IVC Filter. They stopped his blood thinners before the procedure but restarted him after. He was on  blood thinners for less than a week and is currently off of them. He reports a knot in his left arm. He also notices that his last two digits of his left hand have been numb as well as a loss of sensation on the underside of his left arm since this procedure.   Pt has no other known medical problems. He has no family history of blood clots or other bleeding disorders. Today was his last day of his Prednisone course.  Pt is currently taking iron, folic acid, Vitamin C, Vitamin B12. Pt's stools are currently black due to the iron. He has been walking about a mile a day lately.    Of note prior to the patient's visit today, pt has had VAS [place] Lower Extremity Venous Left ([id number]) completed on 03/08/2019 with results revealing "Right: No evidence of common femoral vein obstruction. Left: Findings consistent with acute deep vein thrombosis involving the left posterior tibial veins, and left peroneal veins."   Pt also had VAS [place] Lower Extremity Venous Right ([id number]) completed on 03/15/2019 with results revealing "Right: Findings consistent with acute deep vein thrombosis involving the right posterior tibial veins."  Most recent lab results (03/17/2019) of CBC is as follows: all values are WNL except for RBC at 2.73, Hgb at 8.5, HCT at 26.4, RDW at 16.3, Calcium at 8.3.  On review of systems, pt reports finger numbness  in his left hand, left arm numbness/knot and denies bloody stools, abdominal pain, leg swelling, calf pain and any other symptoms.   On PMHx the pt reports DVT (both legs), COVID19 infection, Prostate Cancer, Prostatectomy (2015).  INTERVAL HISTORY:  I connected with  [place] on 02/19/20 by telephone and verified that I am speaking with the correct person using two identifiers.   I discussed the limitations of evaluation and management by telemedicine. The patient expressed understanding and agreed to proceed.  Other persons participating in the visit and their role in the  encounter:       -Yevette Edwards, Medical Scribe  Patient's location: Home Provider's location: [place] [facility] [place] is a wonderful 64 y.o. male who is here for evaluation and management of DVT. The patient's last visit with [place] was on 12/31/2019. The pt reports that he is doing well overall.  The pt reports that he occasionally feels weakness in his left calf that passes quickly. This sensation often occurs after he has been sitting for awhile or after placing his leg in an awkward position. Pt has continued wearing his compression socks and walking for activity regularly.  Lab results: 12/31/2019 D-dimer at <0.27 12/31/2019 Protein C, Total at 119 12/31/2019 Protein C Activity at 131 12/31/2019 Antithrombin III Func at 103 12/31/2019 Factor 5 leiden mutation is "Negative" 12/31/2019 Prothrombin Gene mutation is "Negative" 12/31/2019 PROTEIN S PANEL is as follows: Protein S Ag, Total at 99, Protein S Ag, Free at 129, Protein S Activity at 115 12/31/2019 Cardiolipin antibodies, IgG, IgM, IgA is as follows: Anticardiolipin IgG at <9, Anticardiolipin IgM at <9, Anticardiolipin IgA at <9 12/31/2019 Beta-2-glycoprotein i abs, IgG/M/A is as follows: Beta-2 Glyco I IgG at <9, Beta-2-Glycoprotein I IgM at <9, Beta-2-Glycoprotein I IgA at <9 02/12/2020 D-dimer at 0.33 02/12/2020 PTT Lupus Anticoagulant at 34.0, DRVVT at 33.0  On review of systems, pt reports left calf weakness and denies any other symptoms.     MEDICAL HISTORY:  Past Medical History:  Diagnosis Date  . Cancer [facility])    prostate cancer   . COVID-19 virus infection   . Hyperlipidemia   . Hypertension   . Numbness   . Prediabetes     SURGICAL HISTORY: Past Surgical History:  Procedure Laterality Date  . IR IVC FILTER PLMT / S&I /IMG GUID/MOD SED  03/16/2019  . IR IVC FILTER RETRIEVAL / S&I /IMG GUID/MOD SED  06/22/2019  . IR RADIOLOGIST EVAL & MGMT  05/22/2019  . LYMPHADENECTOMY Bilateral 06/20/2014    Procedure: LYMPHADENECTOMY;  Surgeon: Raynelle Bring, MD;  Location: [facility];  Service: Urology;  Laterality: Bilateral;  . ROBOT ASSISTED LAPAROSCOPIC RADICAL PROSTATECTOMY N/A 06/20/2014   Procedure: ROBOTIC ASSISTED LAPAROSCOPIC RADICAL PROSTATECTOMY LEVEL 2;  Surgeon: Raynelle Bring, MD;  Location: [facility];  Service: Urology;  Laterality: N/A;  . TONSILLECTOMY     to correct sleep apnea     SOCIAL HISTORY: Social History   Socioeconomic History  . Marital status: Married    Spouse name: Not on file  . Number of children: 3  . Years of education: college  . Highest education level: Doctorate  Occupational History  . Occupation: Estate manager/land agent professor  Tobacco Use  . Smoking status: Never Smoker  . Smokeless tobacco: Never Used  Substance and Sexual Activity  . Alcohol use: Yes    Comment: occasional beer or wine   . Drug use: No  . Sexual activity: Not on file  Other  Topics Concern  . Not on file  Social History Narrative   Lives at home with his wife.   1-2 cups per day.   Right-handed.   Social Determinants of Health   Financial Resource Strain:   . Difficulty of Paying Living Expenses:   Food Insecurity:   . Worried About Charity fundraiser in the Last Year:   . Arboriculturist in the Last Year:   Transportation Needs:   . Film/video editor (Medical):   Marland Kitchen Lack of Transportation (Non-Medical):   Physical Activity:   . Days of Exercise per Week:   . Minutes of Exercise per Session:   Stress:   . Feeling of Stress :   Social Connections:   . Frequency of Communication with Friends and Family:   . Frequency of Social Gatherings with Friends and Family:   . Attends Religious Services:   . Active Member of Clubs or Organizations:   . Attends Archivist Meetings:   Marland Kitchen Marital Status:   Intimate Partner Violence:   . Fear of Current or Ex-Partner:   . Emotionally Abused:   Marland Kitchen Physically Abused:   . Sexually Abused:     FAMILY HISTORY: Family History    Problem Relation Age of Onset  . Diabetes Mother        died at age 90  . Hypertension Mother   . Other Father        enlarged prostate - died at age 67    ALLERGIES:  is allergic to sulfa antibiotics.  MEDICATIONS:  Current Outpatient Medications  Medication Sig Dispense Refill  . Ascorbic Acid (VITAMIN C) 1000 MG tablet Take 1,000 mg by mouth daily.    . Cholecalciferol (VITAMIN D-3) 25 MCG (1000 UT) CAPS Take 2,000 Units by mouth daily.    Marland Kitchen ELIQUIS 5 MG TABS tablet TAKE 1 TABLET BY MOUTH TWICE A DAY 60 tablet 2  . fexofenadine (ALLEGRA) 180 MG tablet Take 180 mg by mouth daily.    . fluticasone (FLONASE) 50 MCG/ACT nasal spray Place 1 spray into both nostrils daily.     Marland Kitchen L-Methylfolate-Algae-B12-B6 (METANX) 3-90.314-2-35 MG CAPS Take 1 capsule by mouth 2 (two) times daily. 60 capsule 11  . Multiple Vitamin (MULTIVITAMIN WITH MINERALS) TABS tablet Take 1 tablet by mouth daily.    . rosuvastatin (CRESTOR) 5 MG tablet Take 5 mg by mouth daily.    Marland Kitchen zinc gluconate 50 MG tablet Take 50 mg by mouth daily.     No current facility-administered medications for this visit.    REVIEW OF SYSTEMS:   A 10+ POINT REVIEW OF SYSTEMS WAS OBTAINED including neurology, dermatology, psychiatry, cardiac, respiratory, lymph, extremities, GI, GU, Musculoskeletal, constitutional, breasts, reproductive, HEENT.  All pertinent positives are noted in the HPI.  All others are negative.   PHYSICAL EXAMINATION: ECOG PERFORMANCE STATUS: 1 - Symptomatic but completely ambulatory  . There were no vitals filed for this visit. There were no vitals filed for this visit. .There is no height or weight on file to calculate BMI.  Telehealth visit  LABORATORY DATA:  I have reviewed the data as listed  . CBC Latest Ref Rng & Units 12/31/2019 06/22/2019 06/20/2019  WBC 4.0 - 10.5 K/uL 6.7 6.0 6.0  Hemoglobin 13.0 - 17.0 g/dL 16.1 15.6 15.6  Hematocrit 39 - 52 % 47.9 48.0 48.2  Platelets 150 - 400 K/uL 243 254  240    . CMP Latest Ref Rng & Units 12/31/2019  06/22/2019 06/20/2019  Glucose 70 - 99 mg/dL 104(H) 100(H) 113(H)  BUN 8 - 23 mg/dL 13 13 15   Creatinine 0.61 - 1.24 mg/dL 0.94 0.82 0.93  Sodium 135 - 145 mmol/L 141 138 140  Potassium 3.5 - 5.1 mmol/L 4.8 4.7 4.1  Chloride 98 - 111 mmol/L 103 103 103  CO2 22 - 32 mmol/L 29 25 28   Calcium 8.9 - 10.3 mg/dL 9.8 9.4 9.5  Total Protein 6.5 - 8.1 g/dL 7.7 - 7.6  Total Bilirubin 0.3 - 1.2 mg/dL 0.5 - 0.3  Alkaline Phos 38 - 126 U/L 55 - 66  AST 15 - 41 U/L 27 - 33  ALT 0 - 44 U/L 31 - 43     RADIOGRAPHIC STUDIES: I have personally reviewed the radiological images as listed and agreed with the findings in the report. No results found.  ASSESSMENT & PLAN:   1) h/o Lower extremity DVT 2015 triggered by prostatectomy - 03/08/2019 VAS [place] Lower Extremity Venous Left ([id number]) revealed "Right: No evidence of common femoral vein obstruction. Left: Findings consistent with acute deep vein thrombosis involving the left posterior tibial veins, and left peroneal veins." - 03/15/2019 VAS [place] Lower Extremity Venous Right ([id number]) which revealed "Right: Findings consistent with acute deep vein thrombosis involving the right posterior tibial veins." 2) Recent lower extremity DVT triggered by COVID 19 infection, pneumonia, high dose steroids. S/p IVC filter due to epistaxis 05/22/2019 US Venous Lower Bilateral (DVT) ([id number]) revealed "No evidence of deep venous thrombosis in either lower extremity. The previously noted bilateral calf vein DVTs have resolved." 3) Epistaxis while on anticoagulation ? Etiology  PLAN: -Discussed pt labwork, 12/31/19; D-dimer, Protein C Activity, Protein C Total, Antithrombin III Func, Protein S Panel, Cardiolipin antibodies, Beta-2-glycoprotein IgG/M/A are all WNL; Factor 5 leiden mutation & Prothrombin Gene mutation are "Negative".  -Discussed 02/12/2020 D-dimer & PTT Lupus Anticoagulant are WNL -Advised pt that the  labs reflect no genetic risk factors for repeat blood clots.  -Advised pt that there is no evidence of blood clots on a chemical level. D dimer WNL -Recommend pt continue to hydrate well, wear compression socks, and stay active to lower risk of repeat clots. -Recommend pt contact us or PCP for any recurrent symptoms.  -Continue 81 mg Aspirin daily for prophylaxis -F/u with PCP   FOLLOW UP:  -Follow up with PCP -Will see back as needed    The total time spent in the appt was 20 minutes and more than 50% was on counseling and direct patient cares.  All of the patient's questions were answered with apparent satisfaction. The patient knows to call the clinic with any problems, questions or concerns.    Sullivan Lone MD [place] AAHIVMS [facility] Hematology/Oncology Physician [facility]  (Office):       [phone] (Work cell):  [phone] (Fax):           [phone]  02/19/2020 10:25 AM  I, Yevette Edwards, am acting as a scribe for Dr. Sullivan Lone.   .I have reviewed the above documentation for accuracy and completeness, and I agree with the above. Brunetta Genera MD

## 2020-06-06 ENCOUNTER — Ambulatory Visit: Payer: BC Managed Care – PPO | Attending: Internal Medicine

## 2020-06-06 DIAGNOSIS — Z23 Encounter for immunization: Secondary | ICD-10-CM

## 2020-06-06 NOTE — Progress Notes (Signed)
   Covid-19 Vaccination Clinic  Name:  Ronald Arnold    MRN: [id number] DOB: 01-07-1956  06/06/2020  Ronald Arnold was observed post Covid-19 immunization for 15 minutes without incident. He was provided with Vaccine Information Sheet and instruction to access the V-Safe system.   Ronald Arnold was instructed to call 911 with any severe reactions post vaccine: Marland Kitchen Difficulty breathing  . Swelling of face and throat  . A fast heartbeat  . A bad rash all over body  . Dizziness and weakness   Immunizations Administered    Name Date Dose VIS Date Route   Pfizer COVID-19 Vaccine 06/06/2020  3:49 PM 0.3 mL 05/07/2020 Intramuscular   Manufacturer: [place]   Lot: [id number]   [place]: [id number]

## 2020-07-23 ENCOUNTER — Other Ambulatory Visit: Payer: Self-pay

## 2020-07-23 DIAGNOSIS — Z20822 Contact with and (suspected) exposure to covid-19: Secondary | ICD-10-CM

## 2020-07-24 LAB — SARS-COV-2, NAA 2 DAY TAT

## 2020-07-24 LAB — NOVEL CORONAVIRUS, NAA: SARS-CoV-2, NAA: NOT DETECTED

## 2020-08-17 IMAGING — DX PORTABLE CHEST - 1 VIEW
2 series · 2 of 2 positions shown · non-contrast
Comparison: June 17, 2014.

CLINICAL DATA: Fever and cough

EXAM:
PORTABLE CHEST 1 VIEW

[chest ap (1 of 2)]
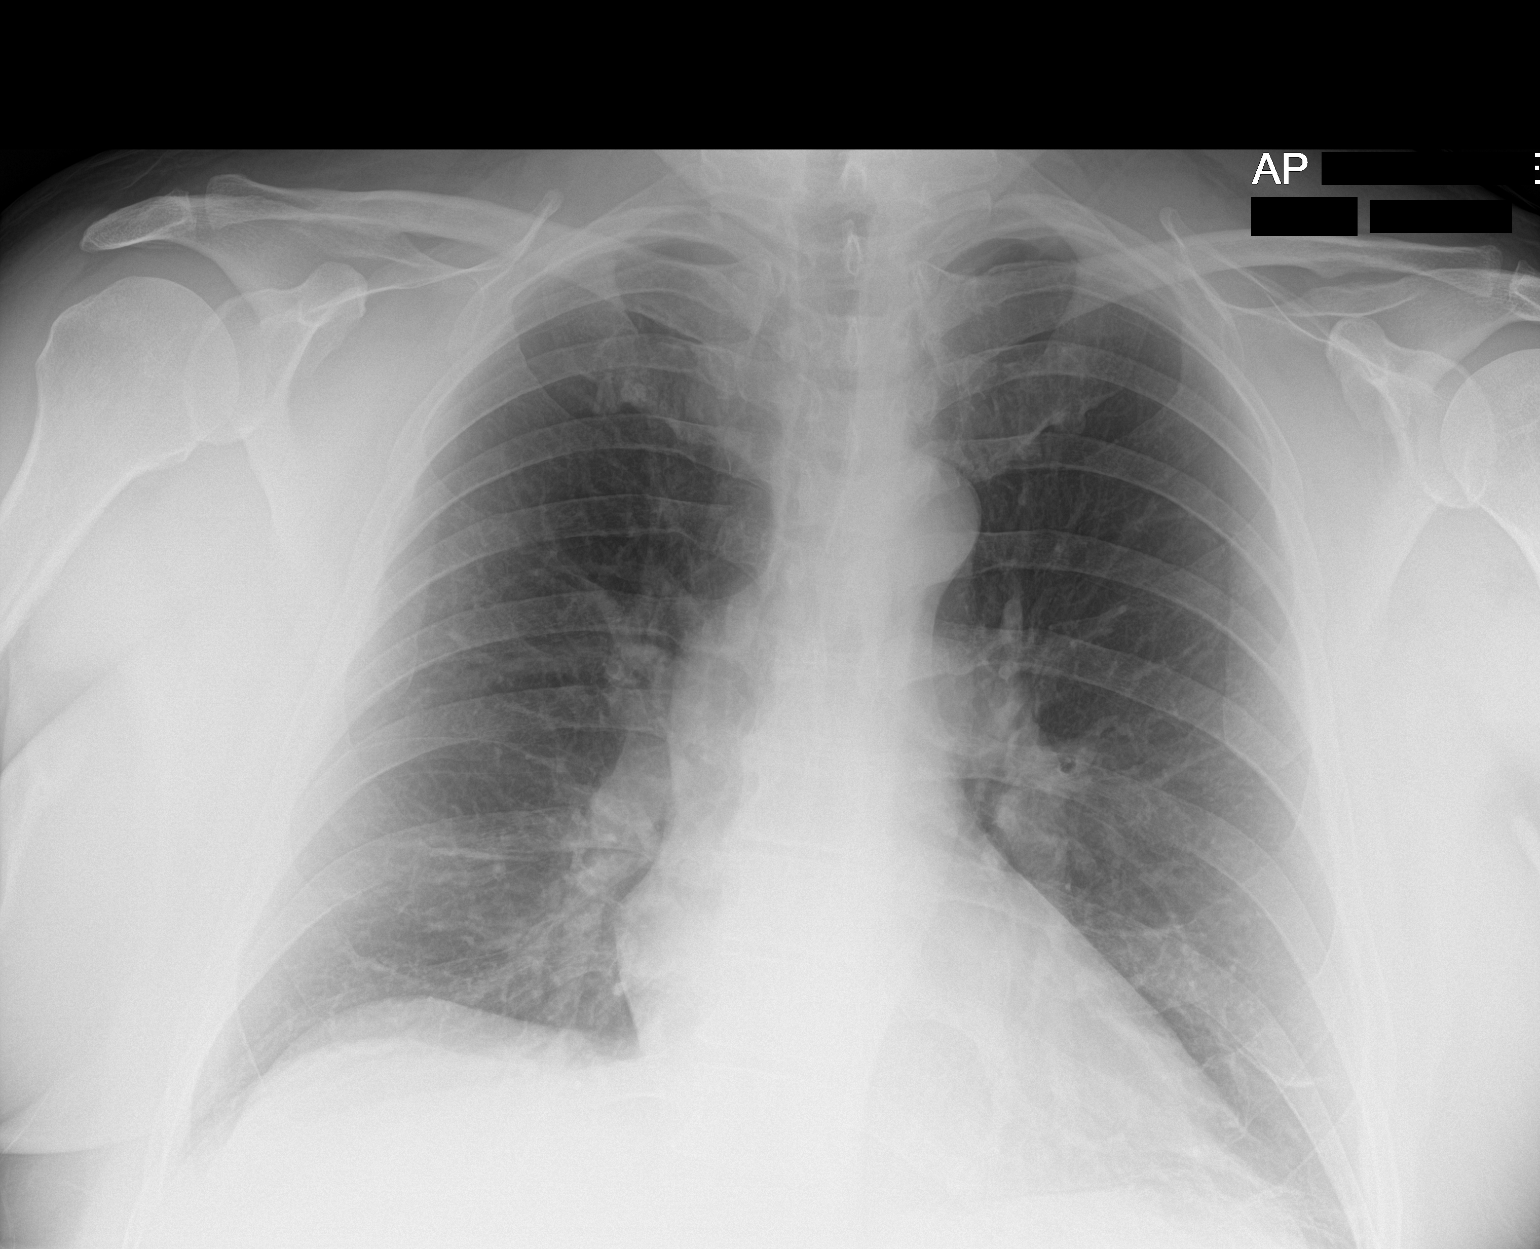

[chest ap (2 of 2)]
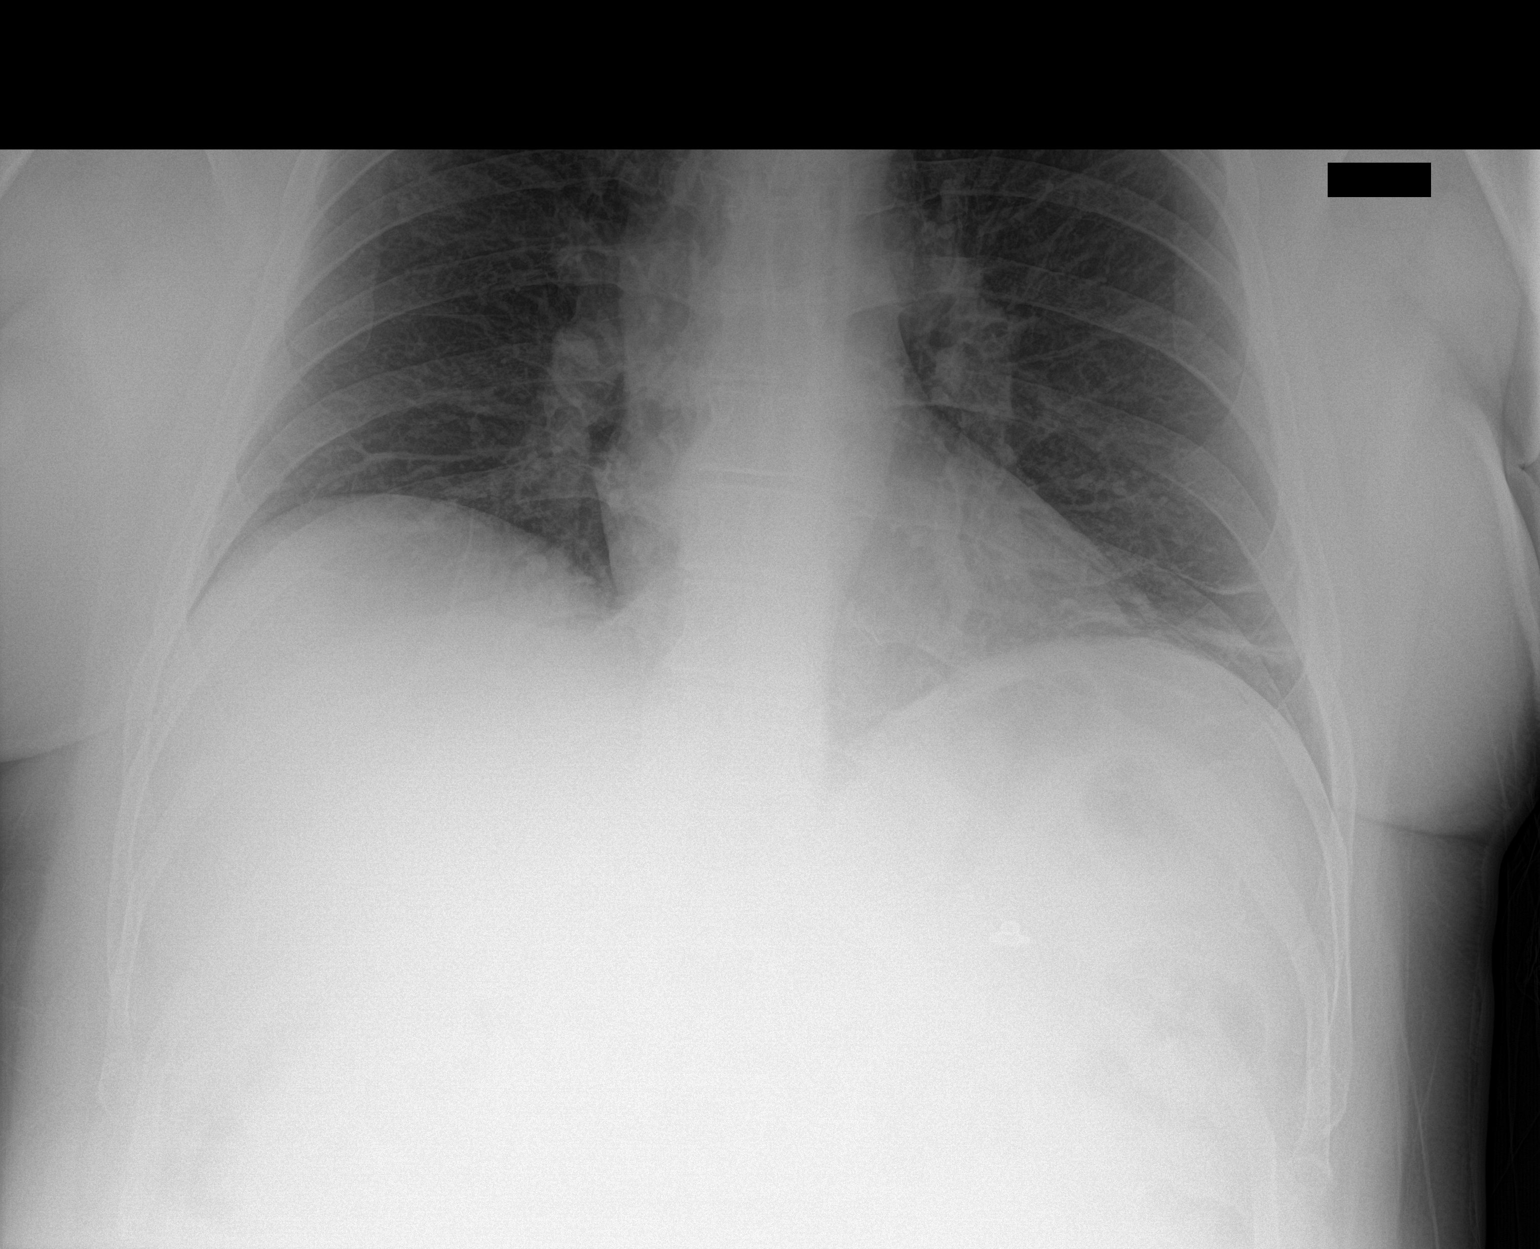

[2 of 2 positions shown; findings below may reference images not displayed]

FINDINGS: The heart size and mediastinal contours are within normal limits.
Linear streaky opacities seen at the left lung base likely
atelectasis. No large airspace consolidation or pleural effusion.
The visualized skeletal structures are unremarkable.
IMPRESSION: Linear atelectasis at the left base.

## 2020-08-24 IMAGING — DX PORTABLE CHEST - 1 VIEW
1 series · 1 of 1 positions shown · non-contrast
Comparison: Portable exam 7769 hours compared to 02/17/2019

CLINICAL DATA: Worsening shortness of breath, UKL2E-0D positive,
history prostate cancer

EXAM:
PORTABLE CHEST 1 VIEW

[chest ap]
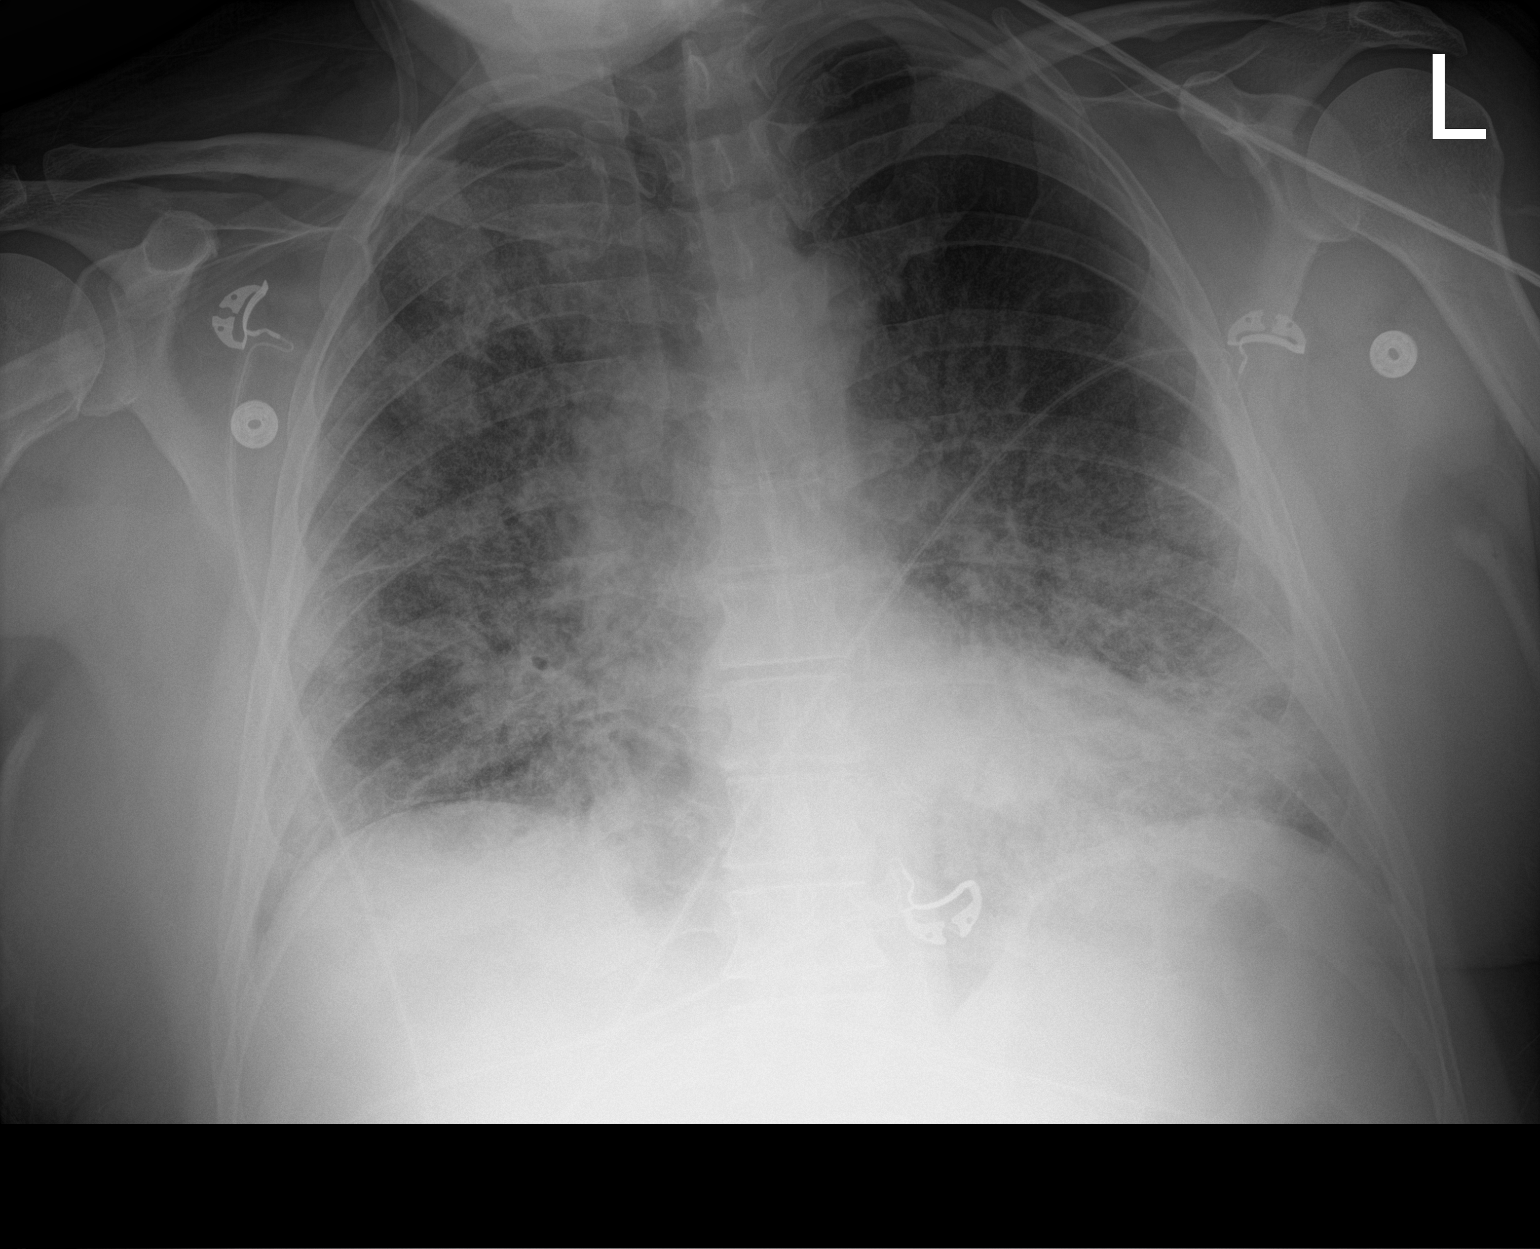

[1 of 1 positions shown; findings below may reference images not displayed]

FINDINGS: Stable heart size mediastinal contours.

Extensive BILATERAL hours space infiltrates progressive since
previous study, least severe in LEFT upper lobe.

No pleural effusion or pneumothorax.
IMPRESSION: Progressive BILATERAL airspace infiltrates consistent with UKL2E-0D.

## 2020-11-14 ENCOUNTER — Ambulatory Visit: Payer: BC Managed Care – PPO | Attending: Family

## 2020-11-14 DIAGNOSIS — Z23 Encounter for immunization: Secondary | ICD-10-CM

## 2020-12-02 ENCOUNTER — Other Ambulatory Visit (HOSPITAL_COMMUNITY): Payer: Self-pay | Admitting: Family Medicine

## 2020-12-02 DIAGNOSIS — R52 Pain, unspecified: Secondary | ICD-10-CM

## 2020-12-03 ENCOUNTER — Ambulatory Visit (HOSPITAL_COMMUNITY)
Admission: RE | Admit: 2020-12-03 | Discharge: 2020-12-03 | Disposition: A | Discharge status: Home / Self Care | Payer: BC Managed Care – PPO | Source: Ambulatory Visit | Attending: Family Medicine | Admitting: Family Medicine

## 2020-12-03 ENCOUNTER — Other Ambulatory Visit: Payer: Self-pay

## 2020-12-03 DIAGNOSIS — R52 Pain, unspecified: Secondary | ICD-10-CM | POA: Diagnosis not present

## 2020-12-03 NOTE — Progress Notes (Signed)
Left lower extremity venous duplex has been completed. Preliminary results can be found in CV Proc through chart review.  Results were given to Dr. Harrington Challenger.  12/03/20 10:03 AM Ronald Arnold RVT

## 2020-12-13 NOTE — Progress Notes (Signed)
   Covid-19 Vaccination Clinic  Name:  Ronald Arnold    MRN: [id number] DOB: Jul 06, 1956  12/13/2020  Mr. Ronald Arnold was observed post Covid-19 immunization for 15 minutes without incident. He was provided with Vaccine Information Sheet and instruction to access the V-Safe system.   Mr. Ronald Arnold was instructed to call 911 with any severe reactions post vaccine: Marland Kitchen Difficulty breathing  . Swelling of face and throat  . A fast heartbeat  . A bad rash all over body  . Dizziness and weakness   Immunizations Administered    Name Date Dose VIS Date Route   Pfizer COVID-19 Vaccine 11/14/2020  9:45 AM 0.3 mL 05/07/2020 Intramuscular   Manufacturer: [place]   Lot: [id number]   [place]: [address]-7

## 2021-04-30 ENCOUNTER — Ambulatory Visit: Payer: BC Managed Care – PPO | Attending: Family

## 2021-04-30 DIAGNOSIS — Z23 Encounter for immunization: Secondary | ICD-10-CM

## 2021-04-30 NOTE — Progress Notes (Signed)
.    Covid-19 Vaccination Clinic  Name:  Ronald Arnold    MRN: [id number] DOB: 1955-12-31  04/30/2021  [name] was observed post Covid-19 immunization for 15 minutes without incident. He was provided with Vaccine Information Sheet and instruction to access the V-Safe system.   [name] was instructed to call 911 with any severe reactions post vaccine: Difficulty breathing  Swelling of face and throat  A fast heartbeat  A bad rash all over body  Dizziness and weakness

## 2022-12-10 ENCOUNTER — Other Ambulatory Visit: Payer: Self-pay | Admitting: Family Medicine

## 2022-12-10 DIAGNOSIS — R198 Other specified symptoms and signs involving the digestive system and abdomen: Secondary | ICD-10-CM

## 2022-12-20 ENCOUNTER — Ambulatory Visit
Admission: RE | Admit: 2022-12-20 | Discharge: 2022-12-20 | Disposition: A | Discharge status: Home / Self Care | Payer: BC Managed Care – PPO | Source: Ambulatory Visit | Attending: Family Medicine | Admitting: Family Medicine

## 2022-12-20 DIAGNOSIS — R198 Other specified symptoms and signs involving the digestive system and abdomen: Secondary | ICD-10-CM

## 2023-01-14 ENCOUNTER — Other Ambulatory Visit: Payer: Self-pay | Admitting: General Surgery

## 2023-01-14 DIAGNOSIS — M6208 Separation of muscle (nontraumatic), other site: Secondary | ICD-10-CM

## 2023-02-08 ENCOUNTER — Ambulatory Visit
Admission: RE | Admit: 2023-02-08 | Discharge: 2023-02-08 | Disposition: A | Discharge status: Home / Self Care | Payer: BC Managed Care – PPO | Source: Ambulatory Visit | Attending: General Surgery | Admitting: General Surgery

## 2023-02-08 DIAGNOSIS — M6208 Separation of muscle (nontraumatic), other site: Secondary | ICD-10-CM

## 2023-02-08 MED ORDER — IOPAMIDOL (ISOVUE-300) INJECTION 61%
100.0000 mL | Freq: Once | INTRAVENOUS | Status: AC | PRN
  Administered 2023-02-08: 100 mL via INTRAVENOUS
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
## 2023-05-12 ENCOUNTER — Ambulatory Visit: Payer: Self-pay | Admitting: General Surgery

## 2023-05-12 DIAGNOSIS — I10 Essential (primary) hypertension: Secondary | ICD-10-CM

## 2023-05-13 NOTE — Patient Instructions (Signed)
SURGICAL WAITING ROOM VISITATION  Patients having surgery or a procedure may have no more than 2 support people in the waiting area - these visitors may rotate.    Children under the age of 66 must have an adult with them who is not the patient.  Due to an increase in RSV and influenza rates and associated hospitalizations, children ages 26 and under may not visit patients in [facility] hospitals.  If the patient needs to stay at the hospital during part of their recovery, the visitor guidelines for inpatient rooms apply. Pre-op nurse will coordinate an appropriate time for 1 support person to accompany patient in pre-op.  This support person may not rotate.    Please refer to the [facility] website for the visitor guidelines for Inpatients (after your surgery is over and you are in a regular room).       Your procedure is scheduled on: 05/27/2023    Report to [facility] Main Entrance    Report to admitting at   0730AM   Call this number if you have problems the morning of surgery [phone]   Do not eat food :After Midnight.   After Midnight you may have the following liquids until _ 0630_____ AM  DAY OF SURGERY  Water Non-Citrus Juices (without pulp, NO RED-Apple, White grape, White cranberry) Black Coffee (NO MILK/CREAM OR CREAMERS, sugar ok)  Clear Tea (NO MILK/CREAM OR CREAMERS, sugar ok) regular and decaf                             Plain Jell-O (NO RED)                                           Fruit ices (not with fruit pulp, NO RED)                                     Popsicles (NO RED)                                                               Sports drinks like Gatorade (NO RED)                    The day of surgery:  Drink ONE (1) Pre-Surgery Clear Ensure or G2 at   0630M  ( have completed by ) the morning of surgery. Drink in one sitting. Do not sip.  This drink was given to you during your hospital  pre-op appointment visit. Nothing else to  drink after completing the  Pre-Surgery Clear Ensure or G2.          If you have questions, please contact your surgeon's office.       Oral Hygiene is also important to reduce your risk of infection.                                    Remember - BRUSH YOUR TEETH THE MORNING OF SURGERY WITH  YOUR REGULAR TOOTHPASTE  DENTURES WILL BE REMOVED PRIOR TO SURGERY PLEASE DO NOT APPLY "Poly grip" OR ADHESIVES!!!   Do NOT smoke after Midnight   Stop all vitamins and herbal supplements 7 days before surgery.   Take these medicines the morning of surgery with A SIP OF WATER:  allergra, flonase   DO NOT TAKE ANY ORAL DIABETIC MEDICATIONS DAY OF YOUR SURGERY  Bring CPAP mask and tubing day of surgery.                              You may not have any metal on your body including hair pins, jewelry, and body piercing             Do not wear make-up, lotions, powders, perfumes/cologne, or deodorant  Do not wear nail polish including gel and S&S, artificial/acrylic nails, or any other type of covering on natural nails including finger and toenails. If you have artificial nails, gel coating, etc. that needs to be removed by a nail salon please have this removed prior to surgery or surgery may need to be canceled/ delayed if the surgeon/ anesthesia feels like they are unable to be safely monitored.   Do not shave  48 hours prior to surgery.               Men may shave face and neck.   Do not bring valuables to the hospital. [facility] IS NOT             RESPONSIBLE   FOR VALUABLES.   Contacts, glasses, dentures or bridgework may not be worn into surgery.   Bring small overnight bag day of surgery.   DO NOT BRING YOUR HOME MEDICATIONS TO THE HOSPITAL. PHARMACY WILL DISPENSE MEDICATIONS LISTED ON YOUR MEDICATION LIST TO YOU DURING YOUR ADMISSION IN THE HOSPITAL!    Patients discharged on the day of surgery will not be allowed to drive home.  Someone NEEDS to stay with you for the first 24  hours after anesthesia.   Special Instructions: Bring a copy of your healthcare power of attorney and living will documents the day of surgery if you haven't scanned them before.              Please read over the following fact sheets you were given: IF YOU HAVE QUESTIONS ABOUT YOUR PRE-OP INSTRUCTIONS PLEASE CALL [phone]   If you received a COVID test during your pre-op visit  it is requested that you wear a mask when out in public, stay away from anyone that may not be feeling well and notify your surgeon if you develop symptoms. If you test positive for Covid or have been in contact with anyone that has tested positive in the last 10 days please notify you surgeon.    [place] - Preparing for Surgery Before surgery, you can play an important role.  Because skin is not sterile, your skin needs to be as free of germs as possible.  You can reduce the number of germs on your skin by washing with CHG (chlorahexidine gluconate) soap before surgery.  CHG is an antiseptic cleaner which kills germs and bonds with the skin to continue killing germs even after washing. Please DO NOT use if you have an allergy to CHG or antibacterial soaps.  If your skin becomes reddened/irritated stop using the CHG and inform your nurse when you arrive at Short Stay. Do not shave (including legs  and underarms) for at least 48 hours prior to the first CHG shower.  You may shave your face/neck. Please follow these instructions carefully:  1.  Shower with CHG Soap the night before surgery and the  morning of Surgery.  2.  If you choose to wash your hair, wash your hair first as usual with your  normal  shampoo.  3.  After you shampoo, rinse your hair and body thoroughly to remove the  shampoo.                           4.  Use CHG as you would any other liquid soap.  You can apply chg directly  to the skin and wash                       Gently with a scrungie or clean washcloth.  5.  Apply the CHG Soap to your body  ONLY FROM THE NECK DOWN.   Do not use on face/ open                           Wound or open sores. Avoid contact with eyes, ears mouth and genitals (private parts).                       Wash face,  Genitals (private parts) with your normal soap.             6.  Wash thoroughly, paying special attention to the area where your surgery  will be performed.  7.  Thoroughly rinse your body with warm water from the neck down.  8.  DO NOT shower/wash with your normal soap after using and rinsing off  the CHG Soap.                9.  Pat yourself dry with a clean towel.            10.  Wear clean pajamas.            11.  Place clean sheets on your bed the night of your first shower and do not  sleep with pets. Day of Surgery : Do not apply any lotions/deodorants the morning of surgery.  Please wear clean clothes to the hospital/surgery center.  FAILURE TO FOLLOW THESE INSTRUCTIONS MAY RESULT IN THE CANCELLATION OF YOUR SURGERY PATIENT SIGNATURE_________________________________  NURSE SIGNATURE__________________________________  ________________________________________________________________________

## 2023-05-13 NOTE — Progress Notes (Signed)
Anesthesia Review:  PCP: Cardiologist : Chest x-ray : EKG : Echo : Stress test: Cardiac Cath :  Activity level:  Sleep Study/ CPAP : Fasting Blood Sugar :      / Checks Blood Sugar -- times a day:   Blood Thinner/ Instructions /Last Dose: ASA / Instructions/ Last Dose :    81 mg aspirin  

## 2023-05-17 ENCOUNTER — Encounter (HOSPITAL_COMMUNITY): Payer: Self-pay

## 2023-05-17 ENCOUNTER — Encounter (HOSPITAL_COMMUNITY)
Admission: RE | Admit: 2023-05-17 | Discharge: 2023-05-17 | Disposition: A | Discharge status: Home / Self Care | Payer: BC Managed Care – PPO | Source: Ambulatory Visit | Attending: General Surgery

## 2023-05-17 ENCOUNTER — Other Ambulatory Visit: Payer: Self-pay

## 2023-05-17 VITALS — BP 156/101 | HR 71 | Temp 98.5°F | Resp 16 | Ht 69.0 in

## 2023-05-17 DIAGNOSIS — Z01818 Encounter for other preprocedural examination: Secondary | ICD-10-CM | POA: Insufficient documentation

## 2023-05-17 DIAGNOSIS — I10 Essential (primary) hypertension: Secondary | ICD-10-CM | POA: Insufficient documentation

## 2023-05-17 DIAGNOSIS — Z01810 Encounter for preprocedural cardiovascular examination: Secondary | ICD-10-CM | POA: Diagnosis present

## 2023-05-17 DIAGNOSIS — Z01812 Encounter for preprocedural laboratory examination: Secondary | ICD-10-CM | POA: Diagnosis present

## 2023-05-17 LAB — CBC WITH DIFFERENTIAL/PLATELET
Abs Immature Granulocytes: 0.02 10*3/uL (ref 0.00–0.07)
Basophils Absolute: 0.1 10*3/uL (ref 0.0–0.1)
Basophils Relative: 1 %
Eosinophils Absolute: 0.1 10*3/uL (ref 0.0–0.5)
Eosinophils Relative: 2 %
HCT: 49.3 % (ref 39.0–52.0)
Hemoglobin: 16.7 g/dL (ref 13.0–17.0)
Immature Granulocytes: 0 %
Lymphocytes Relative: 38 %
Lymphs Abs: 3.1 10*3/uL (ref 0.7–4.0)
MCH: 31.8 pg (ref 26.0–34.0)
MCHC: 33.9 g/dL (ref 30.0–36.0)
MCV: 93.9 fL (ref 80.0–100.0)
Monocytes Absolute: 0.8 10*3/uL (ref 0.1–1.0)
Monocytes Relative: 10 %
Neutro Abs: 4.1 10*3/uL (ref 1.7–7.7)
Neutrophils Relative %: 49 %
Platelets: 225 10*3/uL (ref 150–400)
RBC: 5.25 MIL/uL (ref 4.22–5.81)
RDW: 12.6 % (ref 11.5–15.5)
WBC: 8.1 10*3/uL (ref 4.0–10.5)
nRBC: 0 % (ref 0.0–0.2)

## 2023-05-17 LAB — COMPREHENSIVE METABOLIC PANEL
ALT: 40 U/L (ref 0–44)
AST: 31 U/L (ref 15–41)
Albumin: 4.1 g/dL (ref 3.5–5.0)
Alkaline Phosphatase: 49 U/L (ref 38–126)
Anion gap: 9 (ref 5–15)
BUN: 17 mg/dL (ref 8–23)
CO2: 26 mmol/L (ref 22–32)
Calcium: 9.6 mg/dL (ref 8.9–10.3)
Chloride: 101 mmol/L (ref 98–111)
Creatinine, Ser: 0.65 mg/dL (ref 0.61–1.24)
GFR, Estimated: >60 mL/min (ref >60)
Glucose, Bld: 117 mg/dL — ABNORMAL HIGH (ref 70–99)
Potassium: 4.1 mmol/L (ref 3.5–5.1)
Sodium: 136 mmol/L (ref 135–145)
Total Bilirubin: 0.9 mg/dL (ref 0.3–1.2)
Total Protein: 7.8 g/dL (ref 6.5–8.1)

## 2023-05-20 ENCOUNTER — Encounter (HOSPITAL_COMMUNITY): Payer: Self-pay | Admitting: Physician Assistant

## 2023-05-27 ENCOUNTER — Ambulatory Visit (HOSPITAL_COMMUNITY): Admission: RE | Admit: 2023-05-27 | Payer: BC Managed Care – PPO | Source: Home / Self Care | Admitting: General Surgery

## 2023-05-27 ENCOUNTER — Encounter (HOSPITAL_COMMUNITY): Admission: RE | Payer: Self-pay | Source: Home / Self Care

## 2023-05-27 SURGERY — LAPAROSCOPIC INCISIONAL HERNIA
Anesthesia: General

## 2023-12-21 ENCOUNTER — Encounter (HOSPITAL_COMMUNITY): Payer: Self-pay

## 2024-01-18 NOTE — Progress Notes (Signed)
 Sent message, via epic in basket, requesting orders in epic from Careers adviser.

## 2024-01-19 ENCOUNTER — Ambulatory Visit: Payer: Self-pay | Admitting: General Surgery

## 2024-01-19 NOTE — Patient Instructions (Addendum)
 SURGICAL WAITING ROOM VISITATION Patients having surgery or a procedure may have no more than 2 support people in the waiting area - these visitors may rotate.    Children under the age of 70 must have an adult with them who is not the patient.  If the patient needs to stay at the hospital during part of their recovery, the visitor guidelines for inpatient rooms apply. Pre-op nurse will coordinate an appropriate time for 1 support person to accompany patient in pre-op.  This support person may not rotate.    Please refer to the [facility] website for the visitor guidelines for Inpatients (after your surgery is over and you are in a regular room).       Your procedure is scheduled on: 02-03-24   Report to [facility] Main Entrance    Report to admitting at 7:15 AM   Call this number if you have problems the morning of surgery [phone]   Do not eat food :After Midnight.   After Midnight you may have the following liquids until 6:30 AM DAY OF SURGERY  Water  Non-Citrus Juices (without pulp, NO RED-Apple, White grape, White cranberry) Black Coffee (NO MILK/CREAM OR CREAMERS, sugar ok)  Clear Tea (NO MILK/CREAM OR CREAMERS, sugar ok) regular and decaf                             Plain Jell-O (NO RED)                                           Fruit ices (not with fruit pulp, NO RED)                                     Popsicles (NO RED)                                                               Sports drinks like Gatorade (NO RED)                       If you have questions, please contact your surgeon's office.   FOLLOW  ANY ADDITIONAL PRE OP INSTRUCTIONS YOU RECEIVED FROM YOUR SURGEON'S OFFICE!!!     Oral Hygiene is also important to reduce your risk of infection.                                    Remember - BRUSH YOUR TEETH THE MORNING OF SURGERY WITH YOUR REGULAR TOOTHPASTE   Do NOT smoke after Midnight   Take these medicines the morning of surgery with A SIP  OF WATER :    Allegra   Rosuvastatin   Flonase  nasal spray  Stop all vitamins and herbal supplements 7 days before surgery                              You may not have any metal on your body  including  jewelry, and body piercing             Do not wear lotions, powders,  cologne, or deodorant              Men may shave face and neck.   Do not bring valuables to the hospital. [facility] IS NOT RESPONSIBLE   FOR VALUABLES.   Contacts, dentures or bridgework may not be worn into surgery.  DO NOT BRING YOUR HOME MEDICATIONS TO THE HOSPITAL. PHARMACY WILL DISPENSE MEDICATIONS LISTED ON YOUR MEDICATION LIST TO YOU DURING YOUR ADMISSION IN THE HOSPITAL!    Patients discharged on the day of surgery will not be allowed to drive home.  Someone NEEDS to stay with you for the first 24 hours after anesthesia.   Special Instructions: Bring a copy of your healthcare power of attorney and living will documents the day of surgery if you haven't scanned them before.              Please read over the following fact sheets you were given: IF YOU HAVE QUESTIONS ABOUT YOUR PRE-OP INSTRUCTIONS PLEASE CALL [phone] [facility]  If you received a COVID test during your pre-op visit  it is requested that you wear a mask when out in public, stay away from anyone that may not be feeling well and notify your surgeon if you develop symptoms. If you test positive for Covid or have been in contact with anyone that has tested positive in the last 10 days please notify you surgeon.  [place] - Preparing for Surgery Before surgery, you can play an important role.  Because skin is not sterile, your skin needs to be as free of germs as possible.  You can reduce the number of germs on your skin by washing with CHG (chlorahexidine gluconate) soap before surgery.  CHG is an antiseptic cleaner which kills germs and bonds with the skin to continue killing germs even after washing. Please DO NOT use if you have an allergy to  CHG or antibacterial soaps.  If your skin becomes reddened/irritated stop using the CHG and inform your nurse when you arrive at Short Stay. Do not shave (including legs and underarms) for at least 48 hours prior to the first CHG shower.  You may shave your face/neck.  Please follow these instructions carefully:  1.  Shower with CHG Soap the night before surgery and the  morning of surgery.  2.  If you choose to wash your hair, wash your hair first as usual with your normal  shampoo.  3.  After you shampoo, rinse your hair and body thoroughly to remove the shampoo.                             4.  Use CHG as you would any other liquid soap.  You can apply chg directly to the skin and wash.  Gently with a scrungie or clean washcloth.  5.  Apply the CHG Soap to your body ONLY FROM THE NECK DOWN.   Do   not use on face/ open                           Wound or open sores. Avoid contact with eyes, ears mouth and   genitals (private parts).  Wash face,  Genitals (private parts) with your normal soap.             6.  Wash thoroughly, paying special attention to the area where your    surgery  will be performed.  7.  Thoroughly rinse your body with warm water  from the neck down.  8.  DO NOT shower/wash with your normal soap after using and rinsing off the CHG Soap.                9.  Pat yourself dry with a clean towel.            10.  Wear clean pajamas.            11.  Place clean sheets on your bed the night of your first shower and do not  sleep with pets. Day of Surgery : Do not apply any lotions/deodorants the morning of surgery.  Please wear clean clothes to the hospital/surgery center.  FAILURE TO FOLLOW THESE INSTRUCTIONS MAY RESULT IN THE CANCELLATION OF YOUR SURGERY  PATIENT SIGNATURE_________________________________  NURSE SIGNATURE__________________________________  ________________________________________________________________________

## 2024-01-19 NOTE — Progress Notes (Addendum)
  Date of COVID positive in last 90 days:  PCP - Charles A. Okey, MD (note CEW) Cardiologist -  N/A  Chest x-ray - N/A EKG - 05-17-23 Epic Stress Test - N/A ECHO - N/A Cardiac Cath - N/A Pacemaker/ICD device last checked:N/A Spinal Cord Stimulator:N/A  Bowel Prep - N/A  Sleep Study - Yes, neg for sleep apnea. Sleep apnea symptoms resolved after tonsils removed. CPAP -   Prediabetes A1c 6.0 12-19-23 Fasting Blood Sugar -  Checks Blood Sugar _____ times a day  Last dose of GLP1 agonist-  N/A GLP1 instructions:  Do not take after     Last dose of SGLT-2 inhibitors-  N/A SGLT-2 instructions:  Do not take after    Blood Thinner Instructions:  Last dose:   Time: Aspirin Instructions:  ASA 81.  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  He will check with surgeon to see if he needs to hold or continue. Last Dose:    Activity level:  Can go up a flight of stairs and perform activities of daily living without stopping and without symptoms of chest pain or shortness of breath.  Anesthesia review: N/A  Patient denies shortness of breath, fever, cough and chest pain at PAT appointment  Patient verbalized understanding of instructions that were given to them at the PAT appointment. Patient was also instructed that they will need to review over the PAT instructions again at home before surgery.

## 2024-01-26 ENCOUNTER — Encounter (HOSPITAL_COMMUNITY)
Admission: RE | Admit: 2024-01-26 | Discharge: 2024-01-26 | Disposition: A | Discharge status: Home / Self Care | Source: Ambulatory Visit | Attending: General Surgery | Admitting: General Surgery

## 2024-01-26 ENCOUNTER — Other Ambulatory Visit: Payer: Self-pay

## 2024-01-26 ENCOUNTER — Encounter (HOSPITAL_COMMUNITY): Payer: Self-pay

## 2024-01-26 VITALS — BP 138/85 | HR 94 | Temp 98.9°F | Resp 12 | Ht 70.0 in | Wt 225.6 lb

## 2024-01-26 DIAGNOSIS — Z01818 Encounter for other preprocedural examination: Secondary | ICD-10-CM | POA: Diagnosis present

## 2024-01-26 DIAGNOSIS — Z01812 Encounter for preprocedural laboratory examination: Secondary | ICD-10-CM | POA: Insufficient documentation

## 2024-01-26 DIAGNOSIS — I10 Essential (primary) hypertension: Secondary | ICD-10-CM | POA: Insufficient documentation

## 2024-01-26 HISTORY — DX: Prediabetes: R73.03

## 2024-01-26 LAB — CBC
HCT: 49.4 % (ref 39.0–52.0)
Hemoglobin: 16.6 g/dL (ref 13.0–17.0)
MCH: 31.7 pg (ref 26.0–34.0)
MCHC: 33.6 g/dL (ref 30.0–36.0)
MCV: 94.3 fL (ref 80.0–100.0)
Platelets: 201 K/uL (ref 150–400)
RBC: 5.24 MIL/uL (ref 4.22–5.81)
RDW: 12.9 % (ref 11.5–15.5)
WBC: 7.7 K/uL (ref 4.0–10.5)
nRBC: 0 % (ref 0.0–0.2)

## 2024-01-26 LAB — BASIC METABOLIC PANEL WITH GFR
Anion gap: 10 (ref 5–15)
BUN: 16 mg/dL (ref 8–23)
CO2: 27 mmol/L (ref 22–32)
Calcium: 9.4 mg/dL (ref 8.9–10.3)
Chloride: 99 mmol/L (ref 98–111)
Creatinine, Ser: 0.86 mg/dL (ref 0.61–1.24)
GFR, Estimated: >60 mL/min (ref >60)
Glucose, Bld: 166 mg/dL — ABNORMAL HIGH (ref 70–99)
Potassium: 4.6 mmol/L (ref 3.5–5.1)
Sodium: 136 mmol/L (ref 135–145)

## 2024-02-02 NOTE — Anesthesia Preprocedure Evaluation (Signed)
 Anesthesia Evaluation    Reviewed: Allergy & Precautions, Patient's Chart, lab work & pertinent test results  Airway        Dental   Pulmonary neg pulmonary ROS          Cardiovascular hypertension, Pt. on medications      Neuro/Psych negative neurological ROS  negative psych ROS   GI/Hepatic negative GI ROS, Neg liver ROS,,,  Endo/Other  diabetes (prediabetic)  BMI 32  Renal/GU negative Renal ROS  negative genitourinary   Musculoskeletal negative musculoskeletal ROS (+)    Abdominal   Peds  Hematology negative hematology ROS (+)   Anesthesia Other Findings   Reproductive/Obstetrics negative OB ROS                              Anesthesia Physical Anesthesia Plan  ASA: 2  Anesthesia Plan: General   Post-op Pain Management: Tylenol  PO (pre-op)* and Toradol  IV (intra-op)*   Induction: Intravenous  PONV Risk Score and Plan: 2 and Ondansetron , Dexamethasone , Midazolam  and Treatment may vary due to age or medical condition  Airway Management Planned: Oral ETT  Additional Equipment: None  Intra-op Plan:   Post-operative Plan: Extubation in OR  Informed Consent:   Plan Discussed with:   Anesthesia Plan Comments:         Anesthesia Quick Evaluation

## 2024-02-03 ENCOUNTER — Encounter (HOSPITAL_COMMUNITY): Admission: RE | Payer: Self-pay | Source: Home / Self Care

## 2024-02-03 ENCOUNTER — Ambulatory Visit (HOSPITAL_COMMUNITY): Admission: RE | Admit: 2024-02-03 | Payer: Self-pay | Source: Home / Self Care | Admitting: General Surgery

## 2024-02-03 ENCOUNTER — Encounter (HOSPITAL_COMMUNITY): Payer: Self-pay | Admitting: Anesthesiology

## 2024-02-03 SURGERY — REPAIR, HERNIA, INCISIONAL, LAPAROSCOPIC
Anesthesia: General

## 2024-07-27 ENCOUNTER — Other Ambulatory Visit: Payer: Self-pay | Admitting: General Surgery

## 2024-07-27 ENCOUNTER — Encounter: Payer: Self-pay | Admitting: General Surgery

## 2024-07-27 DIAGNOSIS — K430 Incisional hernia with obstruction, without gangrene: Secondary | ICD-10-CM

## 2024-07-27 DIAGNOSIS — K432 Incisional hernia without obstruction or gangrene: Secondary | ICD-10-CM

## 2024-07-30 ENCOUNTER — Encounter: Payer: Self-pay | Admitting: General Surgery

## 2024-08-01 ENCOUNTER — Encounter: Payer: Self-pay | Admitting: Radiology

## 2024-08-01 ENCOUNTER — Inpatient Hospital Stay: Admission: RE | Admit: 2024-08-01 | Discharge: 2024-08-01 | Attending: General Surgery | Admitting: General Surgery

## 2024-08-01 DIAGNOSIS — K432 Incisional hernia without obstruction or gangrene: Secondary | ICD-10-CM

## 2024-08-01 MED ORDER — IOPAMIDOL (ISOVUE-300) INJECTION 61%
100.0000 mL | Freq: Once | INTRAVENOUS | Status: AC | PRN
  Administered 2024-08-01: 100 mL via INTRAVENOUS
